# Patient Record
Sex: Male | Born: 1965 | Race: White | Hispanic: No | Smoking: Never smoker
Health system: Southern US, Community
[De-identification: ages and names within clinical notes are randomized; demographics above are authoritative.]

## PROBLEM LIST (undated history)

## (undated) DIAGNOSIS — I5020 Unspecified systolic (congestive) heart failure: Secondary | ICD-10-CM

## (undated) DIAGNOSIS — I513 Intracardiac thrombosis, not elsewhere classified: Secondary | ICD-10-CM

## (undated) DIAGNOSIS — I421 Obstructive hypertrophic cardiomyopathy: Secondary | ICD-10-CM

## (undated) DIAGNOSIS — I4819 Other persistent atrial fibrillation: Secondary | ICD-10-CM

## (undated) HISTORY — DX: Obstructive hypertrophic cardiomyopathy: I42.1

## (undated) HISTORY — DX: Intracardiac thrombosis, not elsewhere classified: I51.3

## (undated) HISTORY — DX: Other persistent atrial fibrillation: I48.19

## (undated) HISTORY — DX: Morbid (severe) obesity due to excess calories: E66.01

## (undated) HISTORY — DX: Unspecified systolic (congestive) heart failure: I50.20

## (undated) SURGERY — Surgical Case
Anesthesia: *Unknown

---

## 2018-08-06 ENCOUNTER — Emergency Department: Payer: 59

## 2018-08-06 ENCOUNTER — Other Ambulatory Visit: Payer: Self-pay

## 2018-08-06 ENCOUNTER — Inpatient Hospital Stay
Admission: EM | Admit: 2018-08-06 | Discharge: 2018-08-10 | DRG: 302 | Disposition: A | Discharge status: Home / Self Care | Payer: 59 | Attending: Internal Medicine | Admitting: Internal Medicine

## 2018-08-06 DIAGNOSIS — Z800 Family history of malignant neoplasm of digestive organs: Secondary | ICD-10-CM | POA: Diagnosis not present

## 2018-08-06 DIAGNOSIS — E876 Hypokalemia: Secondary | ICD-10-CM | POA: Diagnosis present

## 2018-08-06 DIAGNOSIS — D649 Anemia, unspecified: Secondary | ICD-10-CM | POA: Diagnosis present

## 2018-08-06 DIAGNOSIS — I513 Intracardiac thrombosis, not elsewhere classified: Secondary | ICD-10-CM | POA: Diagnosis present

## 2018-08-06 DIAGNOSIS — F4320 Adjustment disorder, unspecified: Secondary | ICD-10-CM | POA: Diagnosis not present

## 2018-08-06 DIAGNOSIS — I5023 Acute on chronic systolic (congestive) heart failure: Secondary | ICD-10-CM | POA: Diagnosis present

## 2018-08-06 DIAGNOSIS — I420 Dilated cardiomyopathy: Secondary | ICD-10-CM | POA: Diagnosis not present

## 2018-08-06 DIAGNOSIS — I340 Nonrheumatic mitral (valve) insufficiency: Secondary | ICD-10-CM | POA: Diagnosis not present

## 2018-08-06 DIAGNOSIS — Z6841 Body Mass Index (BMI) 40.0 and over, adult: Secondary | ICD-10-CM

## 2018-08-06 DIAGNOSIS — I472 Ventricular tachycardia, unspecified: Secondary | ICD-10-CM

## 2018-08-06 DIAGNOSIS — T447X5A Adverse effect of beta-adrenoreceptor antagonists, initial encounter: Secondary | ICD-10-CM | POA: Diagnosis present

## 2018-08-06 DIAGNOSIS — R140 Abdominal distension (gaseous): Secondary | ICD-10-CM | POA: Diagnosis present

## 2018-08-06 DIAGNOSIS — I482 Chronic atrial fibrillation, unspecified: Secondary | ICD-10-CM | POA: Diagnosis present

## 2018-08-06 DIAGNOSIS — Z1159 Encounter for screening for other viral diseases: Secondary | ICD-10-CM | POA: Diagnosis not present

## 2018-08-06 DIAGNOSIS — Z825 Family history of asthma and other chronic lower respiratory diseases: Secondary | ICD-10-CM

## 2018-08-06 DIAGNOSIS — I4891 Unspecified atrial fibrillation: Secondary | ICD-10-CM | POA: Diagnosis not present

## 2018-08-06 DIAGNOSIS — I5022 Chronic systolic (congestive) heart failure: Secondary | ICD-10-CM

## 2018-08-06 DIAGNOSIS — I5043 Acute on chronic combined systolic (congestive) and diastolic (congestive) heart failure: Secondary | ICD-10-CM | POA: Diagnosis not present

## 2018-08-06 DIAGNOSIS — F4323 Adjustment disorder with mixed anxiety and depressed mood: Secondary | ICD-10-CM | POA: Diagnosis not present

## 2018-08-06 DIAGNOSIS — Q6689 Other  specified congenital deformities of feet: Secondary | ICD-10-CM

## 2018-08-06 DIAGNOSIS — I361 Nonrheumatic tricuspid (valve) insufficiency: Secondary | ICD-10-CM | POA: Diagnosis not present

## 2018-08-06 DIAGNOSIS — I5021 Acute systolic (congestive) heart failure: Secondary | ICD-10-CM | POA: Diagnosis not present

## 2018-08-06 LAB — CBC WITH DIFFERENTIAL/PLATELET
Abs Immature Granulocytes: 0.02 10*3/uL (ref 0.00–0.07)
Basophils Absolute: 0.1 10*3/uL (ref 0.0–0.1)
Basophils Relative: 1 %
Eosinophils Absolute: 0 10*3/uL (ref 0.0–0.5)
Eosinophils Relative: 0 %
HCT: 45.9 % (ref 39.0–52.0)
Hemoglobin: 14.5 g/dL (ref 13.0–17.0)
Immature Granulocytes: 0 %
Lymphocytes Relative: 26 %
Lymphs Abs: 1.8 10*3/uL (ref 0.7–4.0)
MCH: 29.4 pg (ref 26.0–34.0)
MCHC: 31.6 g/dL (ref 30.0–36.0)
MCV: 93.1 fL (ref 80.0–100.0)
Monocytes Absolute: 0.5 10*3/uL (ref 0.1–1.0)
Monocytes Relative: 8 %
Neutro Abs: 4.6 10*3/uL (ref 1.7–7.7)
Neutrophils Relative %: 65 %
Platelets: 164 10*3/uL (ref 150–400)
RBC: 4.93 MIL/uL (ref 4.22–5.81)
RDW: 14.8 % (ref 11.5–15.5)
WBC: 7 10*3/uL (ref 4.0–10.5)
nRBC: 0 % (ref 0.0–0.2)

## 2018-08-06 LAB — BRAIN NATRIURETIC PEPTIDE: B Natriuretic Peptide: 480 pg/mL — ABNORMAL HIGH (ref 0.0–100.0)

## 2018-08-06 LAB — COMPREHENSIVE METABOLIC PANEL
ALT: 24 U/L (ref 0–44)
AST: 35 U/L (ref 15–41)
Albumin: 4 g/dL (ref 3.5–5.0)
Alkaline Phosphatase: 77 U/L (ref 38–126)
Anion gap: 9 (ref 5–15)
BUN: 11 mg/dL (ref 6–20)
CO2: 23 mmol/L (ref 22–32)
Calcium: 8.7 mg/dL — ABNORMAL LOW (ref 8.9–10.3)
Chloride: 105 mmol/L (ref 98–111)
Creatinine, Ser: 1.07 mg/dL (ref 0.61–1.24)
GFR calc Af Amer: >60 mL/min (ref >60)
GFR calc non Af Amer: >60 mL/min (ref >60)
Glucose, Bld: 138 mg/dL — ABNORMAL HIGH (ref 70–99)
Potassium: 4.1 mmol/L (ref 3.5–5.1)
Sodium: 137 mmol/L (ref 135–145)
Total Bilirubin: 2 mg/dL — ABNORMAL HIGH (ref 0.3–1.2)
Total Protein: 7.1 g/dL (ref 6.5–8.1)

## 2018-08-06 LAB — HEPARIN LEVEL (UNFRACTIONATED): Heparin Unfractionated: <0.1 IU/mL — ABNORMAL LOW (ref 0.30–0.70)

## 2018-08-06 LAB — PROTIME-INR
INR: 1.3 — ABNORMAL HIGH (ref 0.8–1.2)
Prothrombin Time: 15.6 seconds — ABNORMAL HIGH (ref 11.4–15.2)

## 2018-08-06 LAB — MRSA PCR SCREENING: MRSA by PCR: NEGATIVE

## 2018-08-06 LAB — TROPONIN I (HIGH SENSITIVITY)
Troponin I (High Sensitivity): 22 ng/L — ABNORMAL HIGH (ref <18)
Troponin I (High Sensitivity): 18 ng/L — ABNORMAL HIGH (ref <18)

## 2018-08-06 LAB — SARS CORONAVIRUS 2 BY RT PCR (HOSPITAL ORDER, PERFORMED IN ~~LOC~~ HOSPITAL LAB): SARS Coronavirus 2: NEGATIVE

## 2018-08-06 MED ORDER — DILTIAZEM HCL 25 MG/5ML IV SOLN
10.0000 mg | Freq: Once | INTRAVENOUS | Status: AC
  Administered 2018-08-06: 10 mg via INTRAVENOUS
  Filled 2018-08-06: qty 5

## 2018-08-06 MED ORDER — ONDANSETRON HCL 4 MG/2ML IJ SOLN
4.0000 mg | Freq: Four times a day (QID) | INTRAMUSCULAR | Status: DC | PRN
  Administered 2018-08-06 – 2018-08-07 (×2): 4 mg via INTRAVENOUS
  Filled 2018-08-06 (×2): qty 2

## 2018-08-06 MED ORDER — DILTIAZEM HCL 100 MG IV SOLR
5.0000 mg/h | INTRAVENOUS | Status: DC
  Administered 2018-08-06: 17:00:00 5 mg/h via INTRAVENOUS
  Administered 2018-08-06: 10 mg/h via INTRAVENOUS
  Administered 2018-08-07: 10:00:00 12.5 mg/h via INTRAVENOUS
  Filled 2018-08-06 (×3): qty 100

## 2018-08-06 MED ORDER — DILTIAZEM HCL 25 MG/5ML IV SOLN: 10.0000 mg | Freq: Once | INTRAVENOUS | Status: DC

## 2018-08-06 MED ORDER — ASPIRIN EC 81 MG PO TBEC
81.0000 mg | DELAYED_RELEASE_TABLET | Freq: Every day | ORAL | Status: DC
  Administered 2018-08-06 – 2018-08-10 (×4): 81 mg via ORAL
  Filled 2018-08-06 (×6): qty 1

## 2018-08-06 MED ORDER — AMIODARONE HCL IN DEXTROSE 360-4.14 MG/200ML-% IV SOLN
60.0000 mg/h | INTRAVENOUS | Status: DC
  Administered 2018-08-06: 17:00:00 60 mg/h via INTRAVENOUS
  Filled 2018-08-06: qty 200

## 2018-08-06 MED ORDER — ENOXAPARIN SODIUM 40 MG/0.4ML ~~LOC~~ SOLN: 40.0000 mg | Freq: Two times a day (BID) | SUBCUTANEOUS | Status: DC

## 2018-08-06 MED ORDER — ACETAMINOPHEN 325 MG PO TABS
650.0000 mg | ORAL_TABLET | ORAL | Status: DC | PRN
  Administered 2018-08-07: 15:00:00 650 mg via ORAL
  Filled 2018-08-06: qty 2

## 2018-08-06 MED ORDER — AMIODARONE HCL IN DEXTROSE 360-4.14 MG/200ML-% IV SOLN
30.0000 mg/h | INTRAVENOUS | Status: DC
  Administered 2018-08-07: 30 mg/h via INTRAVENOUS
  Filled 2018-08-06: qty 200

## 2018-08-06 MED ORDER — LISINOPRIL 5 MG PO TABS
5.0000 mg | ORAL_TABLET | Freq: Every day | ORAL | Status: DC
  Administered 2018-08-06: 5 mg via ORAL
  Filled 2018-08-06 (×2): qty 1

## 2018-08-06 MED ORDER — AMIODARONE IV BOLUS ONLY 150 MG/100ML
INTRAVENOUS | Status: AC
  Administered 2018-08-06: 150 mg
  Filled 2018-08-06: qty 100

## 2018-08-06 MED ORDER — TRAZODONE HCL 50 MG PO TABS
50.0000 mg | ORAL_TABLET | Freq: Every evening | ORAL | Status: DC | PRN
  Administered 2018-08-06 – 2018-08-09 (×4): 50 mg via ORAL
  Filled 2018-08-06 (×4): qty 1

## 2018-08-06 MED ORDER — SODIUM CHLORIDE 0.9 % IV SOLN
250.0000 mL | INTRAVENOUS | Status: DC | PRN
  Administered 2018-08-08: 09:00:00 via INTRAVENOUS

## 2018-08-06 MED ORDER — FUROSEMIDE 10 MG/ML IJ SOLN
60.0000 mg | Freq: Two times a day (BID) | INTRAMUSCULAR | Status: DC
  Administered 2018-08-06 – 2018-08-10 (×8): 60 mg via INTRAVENOUS
  Filled 2018-08-06 (×9): qty 6

## 2018-08-06 MED ORDER — ENOXAPARIN SODIUM 150 MG/ML ~~LOC~~ SOLN
1.0000 mg/kg | Freq: Two times a day (BID) | SUBCUTANEOUS | Status: DC
  Filled 2018-08-06 (×3): qty 0.88

## 2018-08-06 MED ORDER — CARVEDILOL 3.125 MG PO TABS
3.1250 mg | ORAL_TABLET | Freq: Two times a day (BID) | ORAL | Status: DC
  Administered 2018-08-06 – 2018-08-07 (×2): 3.125 mg via ORAL
  Filled 2018-08-06 (×2): qty 1

## 2018-08-06 MED ORDER — SODIUM CHLORIDE 0.9% FLUSH: 3.0000 mL | INTRAVENOUS | Status: DC | PRN

## 2018-08-06 MED ORDER — SODIUM CHLORIDE 0.9% FLUSH
3.0000 mL | Freq: Two times a day (BID) | INTRAVENOUS | Status: DC
  Administered 2018-08-06 – 2018-08-07 (×3): 3 mL via INTRAVENOUS
  Administered 2018-08-08: 11:00:00 via INTRAVENOUS
  Administered 2018-08-08 – 2018-08-09 (×3): 3 mL via INTRAVENOUS

## 2018-08-06 MED ORDER — POTASSIUM CHLORIDE CRYS ER 20 MEQ PO TBCR
20.0000 meq | EXTENDED_RELEASE_TABLET | Freq: Every day | ORAL | Status: DC
  Administered 2018-08-06 – 2018-08-07 (×2): 20 meq via ORAL
  Filled 2018-08-06 (×2): qty 1

## 2018-08-06 NOTE — ED Notes (Signed)
Trop drawn and sent

## 2018-08-06 NOTE — ED Notes (Signed)
Patient having runs of vt as well as runs of pvc's. Md at bedside to assess. 150 mg amiodarone ordered, med override obtained. As per md will run amiodarone bolus at very slow rate due to b/p, rate set at 33.3mg /hr.

## 2018-08-06 NOTE — ED Provider Notes (Signed)
[facility] Emergency Department Provider Note   First MD Initiated Contact with Patient 08/06/18 1309     (approximate)  I have reviewed the triage vital signs and the nursing notes.   HISTORY  Chief Complaint Atrial Fibrillation    HPI Kenneth Ferguson is a 53 y.o. male referred to the emergency department from urgent care secondary to rapid heartbeat.  Patient states that for the past 3 weeks he has noted worsening bilateral lower extremity edema.  Patient states he was seen at urgent care and diagnosed with bronchitis for which he was prescribed a Z-Pak.  However symptoms has persisted.  Patient also admits to a 10 pound weight gain in the past week.  Patient that his wife recently died and as a result he had an episode of atrial fibrillation following that event which she states that he has not had in many years.  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  Patient also admits to orthopnea.        Past Medical History:  Diagnosis Date  . A-fib [facility])     Patient Active Problem List   Diagnosis Date Noted  . A-fib ([place]) 08/06/2018    History reviewed. No pertinent surgical history.  Prior to Admission medications   Not on File    Allergies Patient has no known allergies.  No family history on file.  Social History Social History   Tobacco Use  . Smoking status: Never Smoker  . Smokeless tobacco: Never Used  Substance Use Topics  . Alcohol use: Never    Frequency: Never  . Drug use: Never    Review of Systems Constitutional: No fever/chills Eyes: No visual changes. ENT: No sore throat. Cardiovascular: Denies chest pain. Respiratory: Positive for dyspnea shortness of breath. Gastrointestinal: No abdominal pain.  No nausea, no vomiting.  No diarrhea.  No constipation. Genitourinary: Negative for dysuria. Musculoskeletal: Negative for neck pain.  Negative for back pain.  Integumentary: Negative for rash. Neurological: Negative for headaches, focal weakness or numbness.   ____________________________________________   PHYSICAL EXAM:  VITAL SIGNS: ED Triage Vitals  Enc Vitals Group     BP 08/06/18 1258 129/79     Pulse Rate 08/06/18 1258 93     Resp 08/06/18 1258 16     Temp 08/06/18 1258 98.7 F (37.1 C)     Temp Source 08/06/18 1258 Oral     SpO2 08/06/18 1258 95 %     Weight 08/06/18 1255 131.5 kg (290 lb)     Height 08/06/18 1255 1.727 m (5\' 8" )     Head Circumference --      Peak Flow --      Pain Score 08/06/18 1255 0     Pain Loc --      Pain Edu? --      Excl. in [place]? --     Constitutional: Alert and oriented.  Very tearful. Eyes: Conjunctivae are normal. Mouth/Throat: Mucous membranes are moist. Oropharynx non-erythematous. Neck: No stridor.  Cardiovascular: Normal rate, regular rhythm. Good peripheral circulation. Grossly normal heart sounds. Respiratory: Normal respiratory effort.  No retractions.  Bibasilar rales gastrointestinal: Soft and nontender. No distention.  Musculoskeletal: 2-3+ bilateral lower extremity pitting edema Neurologic:  Normal speech and language. No gross focal neurologic deficits are appreciated.  Skin:  Skin is warm, dry and intact. No rash noted. Psychiatric: Mood and affect are normal. Speech and behavior are normal.  ____________________________________________   LABS (all  labs ordered are listed, but only abnormal results are displayed)  Labs Reviewed  COMPREHENSIVE METABOLIC PANEL - Abnormal; Notable for the following components:      Result Value   Glucose, Bld 138 (*)    Calcium 8.7 (*)    Total Bilirubin 2.0 (*)    All other components within normal limits  TROPONIN I (HIGH SENSITIVITY) - Abnormal; Notable for the following components:   Troponin I (High Sensitivity) 22 (*)    All other components within normal limits  TROPONIN I (HIGH SENSITIVITY) - Abnormal; Notable for the following  components:   Troponin I (High Sensitivity) 18 (*)    All other components within normal limits  BRAIN NATRIURETIC PEPTIDE - Abnormal; Notable for the following components:   B Natriuretic Peptide 480.0 (*)    All other components within normal limits  HEPARIN LEVEL (UNFRACTIONATED) - Abnormal; Notable for the following components:   Heparin Unfractionated <0.10 (*)    All other components within normal limits  PROTIME-INR - Abnormal; Notable for the following components:   Prothrombin Time 15.6 (*)    INR 1.3 (*)    All other components within normal limits  SARS CORONAVIRUS 2 (HOSPITAL ORDER, PERFORMED IN  HOSPITAL LAB)  MRSA PCR SCREENING  CBC WITH DIFFERENTIAL/PLATELET  HIV ANTIBODY (ROUTINE TESTING W REFLEX)  BASIC METABOLIC PANEL  MAGNESIUM   ____________________________________________  EKG  ED ECG REPORT I, [place] N Clyde Zarrella, the attending physician, personally viewed and interpreted this ECG.   Date: 08/06/2018  EKG Time: 12:56 PM  Rate: 168  Rhythm: Atrial fibrillation with rapid ventricular response  Axis: Normal  Intervals: Irregular RR interval  ST&T Change: None  ____________________________________________  RADIOLOGY I, [place] N Etrulia Zarr, personally viewed and evaluated these images (plain radiographs) as part of my medical decision making, as well as reviewing the written report by the radiologist.  ED MD interpretation: Cardiomegaly with vascular congestion curly B lines and cephalization noted on chest x-ray  Official radiology report(s): Dg Chest 2 View  Result Date: 08/06/2018 CLINICAL DATA:  Atrial fibrillation.  Chest pain. EXAM: CHEST - 2 VIEW COMPARISON:  None. FINDINGS: There is cardiomegaly with mild pulmonary vascular congestion, a small right effusion and a tiny left effusion. No consolidative infiltrates.  Bones are normal. IMPRESSION: Congestive heart failure. Electronically Signed   By: Francene BoyersJames  Maxwell M.D.   On: 08/06/2018 14:02      .Critical Care Performed by: Darci CurrentBrown, Nora N, MD Authorized by: Darci CurrentBrown, [place] N, MD   Critical care provider statement:    Critical care time (minutes):  60   Critical care time was exclusive of:  Separately billable procedures and treating other patients (Atrial fibrillation with rapid ventricular response, nonsustained ventricular tachycardia)   Critical care was time spent personally by me on the following activities:  Development of treatment plan with patient or surrogate, discussions with consultants, evaluation of patient's response to treatment, examination of patient, obtaining history from patient or surrogate, ordering and performing treatments and interventions, ordering and review of laboratory studies, ordering and review of radiographic studies, pulse oximetry, re-evaluation of patient's condition and review of old charts     ____________________________________________   INITIAL IMPRESSION / MDM / ASSESSMENT AND PLAN / ED COURSE  As part of my medical decision making, I reviewed the following data within the electronic MEDICAL RECORD NUMBER   53 year old male presenting with above-stated history and physical exam consistent with atrial fibrillation with rapid ventricular response and concern for possible new onset  congestive heart failure.  Patient given IV diltiazem 10 mg without any sustained improvement in the patient's heart rate.  Reevaluation patient had a 5 beat run of nonsustained ventricular tachycardia.  Amiodarone infusion initiated at this point.  Patient discussed with Dr. Johny Drilling for hospital admission for further evaluation and management of atrial fibrillation with rapid ventricular response, nonsustained ventricular tachycardia and a new onset congestive heart failure  *Romere Gardy was evaluated in Emergency Department on 08/06/2018 for the symptoms described in the history of present illness. He was evaluated in the context of the global COVID-19 pandemic,  which necessitated consideration that the patient might be at risk for infection with the SARS-CoV-2 virus that causes COVID-19. Institutional protocols and algorithms that pertain to the evaluation of patients at risk for COVID-19 are in a state of rapid change based on information released by regulatory bodies including the CDC and federal and state organizations. These policies and algorithms were followed during the patient's care in the ED.  Some ED evaluations and interventions may be delayed as a result of limited staffing during the pandemic.*    ____________________________________________  FINAL CLINICAL IMPRESSION(S) / ED DIAGNOSES  Final diagnoses:  Atrial fibrillation with rapid ventricular response (HCC)  Ventricular tachycardia (HCC)  New onset congestive heart failure   MEDICATIONS GIVEN DURING THIS VISIT:  Medications  diltiazem (CARDIZEM) injection 10 mg (10 mg Intravenous Not Given 08/06/18 1444)  amiodarone (NEXTERONE PREMIX) 360-4.14 MG/200ML-% (1.8 mg/mL) IV infusion (60 mg/hr Intravenous Rate/Dose Verify 08/06/18 2000)    Followed by  amiodarone (NEXTERONE PREMIX) 360-4.14 MG/200ML-% (1.8 mg/mL) IV infusion (30 mg/hr Intravenous Rate/Dose Verify 08/06/18 2100)  sodium chloride flush (NS) 0.9 % injection 3 mL (has no administration in time range)  sodium chloride flush (NS) 0.9 % injection 3 mL (has no administration in time range)  0.9 %  sodium chloride infusion (has no administration in time range)  acetaminophen (TYLENOL) tablet 650 mg (has no administration in time range)  ondansetron (ZOFRAN) injection 4 mg (has no administration in time range)  furosemide (LASIX) injection 60 mg (60 mg Intravenous Given 08/06/18 1803)  potassium chloride SA (K-DUR) CR tablet 20 mEq (20 mEq Oral Given 08/06/18 1803)  lisinopril (ZESTRIL) tablet 5 mg (5 mg Oral Given 08/06/18 1804)  carvedilol (COREG) tablet 3.125 mg (3.125 mg Oral Given 08/06/18 1803)  aspirin EC tablet 81 mg (81 mg  Oral Given 08/06/18 1803)  diltiazem (CARDIZEM) 100 mg in dextrose 5 % 100 mL (1 mg/mL) infusion (15 mg/hr Intravenous Rate/Dose Verify 08/06/18 2035)  enoxaparin (LOVENOX) injection 130 mg (has no administration in time range)  diltiazem (CARDIZEM) injection 10 mg (10 mg Intravenous Given 08/06/18 1337)  amiodarone (NEXTERONE) 150-4.21 MG/100ML-% bolus (  Stopped 08/06/18 1555)     ED Discharge Orders    None       Note:  This document was prepared using Dragon voice recognition software and may include unintentional dictation errors.   Darci Current, MD 08/06/18 2108

## 2018-08-06 NOTE — ED Triage Notes (Signed)
Pt reports he went to urgent care and they sent here for afib RVR - pt has hx of same - 3 weeks ago sick with "bronchitis" and given zpac - noted edema to lower ext x3 weeks (gained 10lbs in one week) - c/o chest pain - denies [facility]

## 2018-08-06 NOTE — Consult Note (Signed)
  Amiodarone Drug - Drug Interaction Consult Note  Recommendations: There are no interactions with the medications that patient is currently  on. Amiodarone is metabolized by the cytochrome P450 system and therefore has the potential to cause many drug interactions. Amiodarone has an average plasma half-life of 50 days (range 20 to 100 days).   There is potential for drug interactions to occur several weeks or months after stopping treatment and the onset of drug interactions may be slow after initiating amiodarone.   []  Statins: Increased risk of myopathy. Simvastatin- restrict dose to 20mg  daily. Other statins: counsel patients to report any muscle pain or weakness immediately.  []  Anticoagulants: Amiodarone can increase anticoagulant effect. Consider warfarin dose reduction. Patients should be monitored closely and the dose of anticoagulant altered accordingly, remembering that amiodarone levels take several weeks to stabilize.  []  Antiepileptics: Amiodarone can increase plasma concentration of phenytoin, the dose should be reduced. Note that small changes in phenytoin dose can result in large changes in levels. Monitor patient and counsel on signs of toxicity.  []  Beta blockers: increased risk of bradycardia, AV block and myocardial depression. Sotalol - avoid concomitant use.  []   Calcium channel blockers (diltiazem and verapamil): increased risk of bradycardia, AV block and myocardial depression.  []   Cyclosporine: Amiodarone increases levels of cyclosporine. Reduced dose of cyclosporine is recommended.  []  Digoxin dose should be halved when amiodarone is started.  []  Diuretics: increased risk of cardiotoxicity if hypokalemia occurs.  []  Oral hypoglycemic agents (glyburide, glipizide, glimepiride): increased risk of hypoglycemia. Patient's glucose levels should be monitored closely when initiating amiodarone therapy.   []  Drugs that prolong the QT interval:  Torsades de pointes risk  may be increased with concurrent use - avoid if possible.  Monitor QTc, also keep magnesium/potassium WNL if concurrent therapy can't be avoided. Marland Kitchen Antibiotics: e.g. fluoroquinolones, erythromycin. . Antiarrhythmics: e.g. quinidine, procainamide, disopyramide, sotalol. . Antipsychotics: e.g. phenothiazines, haloperidol.  . Lithium, tricyclic antidepressants, and methadone. Thank You,  Pearla Dubonnet  08/06/2018 2:36 PM

## 2018-08-06 NOTE — Consult Note (Signed)
ANTICOAGULATION CONSULT NOTE - Initial Consult  Pharmacy Consult for Enoxaparin Dosing Indication: atrial fibrillation  No Known Allergies  Patient Measurements: Height: 5\' 8"  (172.7 cm) Weight: 290 lb (131.5 kg) IBW/kg (Calculated) : 68.4 Heparin Dosing Weight: 99.3 kg  Vital Signs: Temp: 98.7 F (37.1 C) (06/29 1258) Temp Source: Oral (06/29 1258) BP: 138/110 (06/29 1515) Pulse Rate: 102 (06/29 1515)  Labs: Recent Labs    08/06/18 1316  HGB 14.5  HCT 45.9  PLT 164  CREATININE 1.07  TROPONINIHS 18*  22*    Estimated Creatinine Clearance: 104.5 mL/min (by C-G formula based on SCr of 1.07 mg/dL).   Medical History: Past Medical History:  Diagnosis Date  . A-fib ([place])     Medications:  (Not in a hospital admission)  Scheduled:  . diltiazem  10 mg Intravenous Once  . enoxaparin (LOVENOX) injection  1 mg/kg Subcutaneous Q12H   Infusions:  . amiodarone 150 mg/hr (08/06/18 1526)   Followed by  . amiodarone    . diltiazem (CARDIZEM) infusion     PRN:   Assessment: Pharmacy has been consulted to initiate Lovenox therapy for Afib on 53 y.o. patient. Patient has no prior history of DOAC or other anticoagulant use. Will start therapy immediately. Will obtain baseline labs.  Goal of Therapy:   Monitor platelets by anticoagulation protocol: Yes   Plan:  Lovenox 130mg  IV Q12 hours has been ordered based on 1mg /kg BID dosing for Afib.  Pharmacy will continue to monitor with AM labs and CBC at least once every 3 days per protocol.  Pearla Dubonnet, PharmD Clinical Pharmacist 08/06/2018 4:27 PM

## 2018-08-06 NOTE — ED Notes (Signed)
ED TO INPATIENT HANDOFF REPORT  ED Nurse Name and Phone #:    S Name/Age/Gender Kenneth Ferguson 53 y.o. male Room/Bed: ED17A/ED17A  Code Status   Code Status: Not on file  Home/SNF/Other Home Patient oriented to: self, place, time and situation Is this baseline? Yes   Triage Complete: Triage complete  Chief Complaint A fib/rvr per urgent care   Triage Note Pt reports he went to urgent care and they sent here for afib RVR - pt has hx of same - 3 weeks ago sick with "bronchitis" and given zpac - noted edema to lower ext x3 weeks (gained 10lbs in one week) - c/o chest pain - denies [facility]    Allergies No Known Allergies  Level of Care/Admitting Diagnosis ED Disposition    ED Disposition Condition Comment   Admit  Hospital Area: [facility] [[id number]]  Level of Care: Telemetry [5]  Covid Evaluation: Person Under Investigation (PUI)  Isolation Risk Level: Low Risk/Droplet (Less than 4L [place] supplementation)  Diagnosis: A-fib [facility]) [[id number]]  Admitting Physician: Ihor Austin [[id number]]  Attending Physician: Ihor Austin [[id number]]  Estimated length of stay: past midnight tomorrow  Certification:: I certify this patient will need inpatient services for at least 2 midnights  PT Class (Do Not Modify): Inpatient [101]  PT Acc Code (Do Not Modify): Private [1]       B Medical/Surgery History Past Medical History:  Diagnosis Date  . A-fib [facility])    History reviewed. No pertinent surgical history.   A IV Location/Drains/Wounds Patient Lines/Drains/Airways Status   Active Line/Drains/Airways    Name:   Placement date:   Placement time:   Site:   Days:   Peripheral IV 08/06/18 Right Wrist   08/06/18    1333    Wrist   less than 1          Intake/Output Last 24 hours No intake or output data in the 24 hours ending 08/06/18 1527  Labs/Imaging Results for orders placed or performed during the hospital encounter of 08/06/18 (from the past 48 hour(s))   Comprehensive metabolic panel     Status: Abnormal   Collection Time: 08/06/18  1:16 PM  Result Value Ref Range   Sodium 137 135 - 145 mmol/L   Potassium 4.1 3.5 - 5.1 mmol/L    Comment: HEMOLYSIS AT THIS LEVEL MAY AFFECT RESULT   Chloride 105 98 - 111 mmol/L   CO2 23 22 - 32 mmol/L   Glucose, Bld 138 (H) 70 - 99 mg/dL   BUN 11 6 - 20 mg/dL   Creatinine, Ser 4.36 0.61 - 1.24 mg/dL   Calcium 8.7 (L) 8.9 - 10.3 mg/dL   Total Protein 7.1 6.5 - 8.1 g/dL   Albumin 4.0 3.5 - 5.0 g/dL   AST 35 15 - 41 U/L   ALT 24 0 - 44 U/L   Alkaline Phosphatase 77 38 - 126 U/L   Total Bilirubin 2.0 (H) 0.3 - 1.2 mg/dL   GFR calc non Af Amer >60 >60 mL/min   GFR calc Af Amer >60 >60 mL/min   Anion gap 9 5 - 15    Comment: Performed at [facility], [address] [place], [place] [address]  CBC with Differential     Status: None   Collection Time: 08/06/18  1:16 PM  Result Value Ref Range   WBC 7.0 4.0 - 10.5 K/uL   RBC 4.93 4.22 - 5.81 MIL/uL   Hemoglobin 14.5 13.0 - 17.0 g/dL  HCT 45.9 39.0 - 52.0 %   MCV 93.1 80.0 - 100.0 fL   MCH 29.4 26.0 - 34.0 pg   MCHC 31.6 30.0 - 36.0 g/dL   RDW 14.8 11.5 - 15.5 %   Platelets 164 150 - 400 K/uL   nRBC 0.0 0.0 - 0.2 %   Neutrophils Relative % 65 %   Neutro Abs 4.6 1.7 - 7.7 K/uL   Lymphocytes Relative 26 %   Lymphs Abs 1.8 0.7 - 4.0 K/uL   Monocytes Relative 8 %   Monocytes Absolute 0.5 0.1 - 1.0 K/uL   Eosinophils Relative 0 %   Eosinophils Absolute 0.0 0.0 - 0.5 K/uL   Basophils Relative 1 %   Basophils Absolute 0.1 0.0 - 0.1 K/uL   Immature Granulocytes 0 %   Abs Immature Granulocytes 0.02 0.00 - 0.07 K/uL    Comment: Performed at [facility], [place], [place] [address]  Troponin I (High Sensitivity)     Status: Abnormal   Collection Time: 08/06/18  1:16 PM  Result Value Ref Range   Troponin I (High Sensitivity) 22 (H) <18 ng/L    Comment: (NOTE) Elevated high sensitivity troponin I (hsTnI) values and  significant  changes across serial measurements may suggest ACS but many other  chronic and acute conditions are known to elevate hsTnI results.  Refer to the "Links" section for chest pain algorithms and additional  guidance. Performed at [facility], [address] [place], [place] [address]   Brain natriuretic peptide     Status: Abnormal   Collection Time: 08/06/18  1:16 PM  Result Value Ref Range   B Natriuretic Peptide 480.0 (H) 0.0 - 100.0 pg/mL    Comment: Performed at [facility], [address], [place], [place] [address]   Dg Chest 2 View  Result Date: 08/06/2018 CLINICAL DATA:  Atrial fibrillation.  Chest pain. EXAM: CHEST - 2 VIEW COMPARISON:  None. FINDINGS: There is cardiomegaly with mild pulmonary vascular congestion, a small right effusion and a tiny left effusion. No consolidative infiltrates.  Bones are normal. IMPRESSION: Congestive heart failure. Electronically Signed   By: Lorriane Shire M.D.   On: 08/06/2018 14:02    Pending Labs Unresulted Labs (From admission, onward)    Start     Ordered   08/06/18 1340  SARS Coronavirus 2 (CEPHEID - Performed in [place] hospital lab), [place]  (Asymptomatic Patients Labs)  Once,   STAT    Question:  Rule Out  Answer:  Yes   08/06/18 1339   08/06/18 1310  Troponin I (High Sensitivity)  STAT Now then every 2 hours,   STAT    Question:  Indication  Answer:  Other   08/06/18 1309   Signed and Held  HIV antibody (Routine Testing)  Once,   R     Signed and Held   Signed and Held  Basic metabolic panel  Daily,   R     Signed and Held          Vitals/Pain Today's Vitals   08/06/18 1445 08/06/18 1500 08/06/18 1515 08/06/18 1524  BP: 121/88  (!) 138/110   Pulse: 97 84 (!) 102   Resp: (!) 21 (!) 22 (!) 26   Temp:      TempSrc:      SpO2: 95% 95% 96%   Weight:      Height:      PainSc:    0-No pain  Isolation Precautions No active isolations  Medications Medications  diltiazem  (CARDIZEM) injection 10 mg (10 mg Intravenous Not Given 08/06/18 1444)  amiodarone (NEXTERONE PREMIX) 360-4.14 MG/200ML-% (1.8 mg/mL) IV infusion (150 mg/hr Intravenous Rate/Dose Change 08/06/18 1526)    Followed by  amiodarone (NEXTERONE PREMIX) 360-4.14 MG/200ML-% (1.8 mg/mL) IV infusion (has no administration in time range)  diltiazem (CARDIZEM) 100 mg in dextrose 5 % 100 mL (1 mg/mL) infusion (has no administration in time range)  diltiazem (CARDIZEM) injection 10 mg (10 mg Intravenous Given 08/06/18 1337)  amiodarone (NEXTERONE) 150-4.21 MG/100ML-% bolus (150 mg  New Bag/Given 08/06/18 1432)    Mobility walks with device Low fall risk   Focused Assessments Cardiac Assessment Handoff:    No results found for: CKTOTAL, CKMB, CKMBINDEX, TROPONINI No results found for: DDIMER Does the Patient currently have chest pain? No      R Recommendations: See Admitting Provider Note  Report given to:   Additional Notes:

## 2018-08-06 NOTE — Consult Note (Addendum)
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 fib with RVR previously on metoprolol.  Patient states that he stopped taking metoprolol 5 years ago due to bradycardia and hypotension.  Most recently he had significant emotional distress due to loss of his wife after multiple hospitalizations.  Patient states is affected him severely and was crying during interview at bedside.  His son is with him during my evaluation.  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  He has had episodes where heart rate was over 180.  Recently in the past 3 weeks he has developed bronchitis and has been treated with a Z-Pak and albuterol inhaler.  He had noticed 3+ pitting edema bilaterally and feeling congested with resultant 12 pound weight gain in the past 3 weeks.  He notes that over last several months he has been profoundly dyspneic while ambulating during work.  He denies constitutional symptoms, flulike illness or sick contacts.  In the ED he was noted to have atrial fibrillation with rapid ventricular response and nonsustained ventricular tachycardia.  Chest x-ray shows cephalization with curly B-lines consistent with interstitial edema.  Patient was upgraded to medical intensive care unit for Cardizem drip and ICU monitoring as well as evaluation for new onset CHF.  PAST MEDICAL HISTORY   Past Medical History:  Diagnosis Date  . A-fib [facility])      SURGICAL HISTORY   History reviewed. No pertinent surgical history.    FAMILY HISTORY   No family history on file.   SOCIAL HISTORY   Social History   Tobacco Use  . Smoking status: Never Smoker  . Smokeless tobacco: Never Used  Substance Use Topics  . Alcohol use: Never    Frequency: Never  . Drug use: Never     MEDICATIONS   Current Medication:  Current Facility-Administered Medications:  .  0.9 %  sodium chloride infusion, 250 mL, Intravenous, PRN, Pyreddy, Pavan, MD .  acetaminophen (TYLENOL) tablet 650 mg, 650 mg, Oral, Q4H PRN, Pyreddy, Pavan, MD .  amiodarone (NEXTERONE PREMIX) 360-4.14 MG/200ML-% (1.8 mg/mL) IV infusion, 60 mg/hr, Intravenous, Continuous, Last Rate: 33.3 mL/hr at 08/06/18 1712, 60 mg/hr at 08/06/18 1712 **FOLLOWED BY** amiodarone (NEXTERONE PREMIX) 360-4.14 MG/200ML-% (1.8 mg/mL) IV infusion, 30 mg/hr, Intravenous, Continuous, Gregor Hams, MD .  aspirin EC tablet 81 mg, 81 mg, Oral, Daily, Pyreddy, Pavan, MD, 81 mg at 08/06/18 1803 .  carvedilol (COREG) tablet 3.125 mg, 3.125 mg, Oral, BID WC, Pyreddy, Pavan, MD, 3.125 mg at 08/06/18 1803 .  diltiazem (CARDIZEM) 100 mg in dextrose 5 % 100 mL (1 mg/mL) infusion, 5-15 mg/hr, Intravenous, Titrated, Pyreddy, Pavan, MD, Last Rate: 10 mL/hr at 08/06/18 1741, 10 mg/hr at 08/06/18 1741 .  diltiazem (CARDIZEM) injection 10 mg, 10 mg, Intravenous, Once, Gregor Hams, MD .  enoxaparin (LOVENOX) injection 130 mg, 1 mg/kg, Subcutaneous, Q12H, Nazari, Walid A, RPH .  furosemide (LASIX) injection 60 mg, 60 mg, Intravenous, BID, Pyreddy, Pavan, MD, 60 mg at 08/06/18 1803 .  lisinopril (ZESTRIL) tablet 5 mg, 5 mg, Oral, Daily, Pyreddy, Pavan, MD, 5 mg at 08/06/18 1804 .  ondansetron (ZOFRAN) injection 4 mg, 4 mg, Intravenous, Q6H  PRN, Ihor AustinPyreddy, Pavan, MD .  potassium chloride SA (K-DUR) CR tablet 20 mEq, 20 mEq, Oral, Daily, Pyreddy, Pavan, MD, 20 mEq at 08/06/18 1803 .  sodium chloride flush (NS) 0.9 % injection 3 mL, 3 mL, Intravenous, Q12H, Pyreddy, Pavan, MD .  sodium chloride  flush (NS) 0.9 % injection 3 mL, 3 mL, Intravenous, PRN, Ihor AustinPyreddy, Pavan, MD    ALLERGIES   Patient has no known allergies.    REVIEW OF SYSTEMS     10 point review of systems is negative except for fullness in the abdomen  PHYSICAL EXAMINATION   Vitals:   08/06/18 1729 08/06/18 1803  BP: (!) 144/106 126/71  Pulse: (!) 162 (!) 170  Resp:    Temp: 97.6 F (36.4 C)   SpO2: 98%     GENERAL: Emotional crying age-appropriate morbid obese male HEAD: Normocephalic, atraumatic.  EYES: Pupils equal, round, reactive to light.  No scleral icterus.  MOUTH: Moist mucosal membrane. NECK: Supple. No thyromegaly. No nodules. No JVD.  PULMONARY: Mild crackles at the bases bilaterally CARDIOVASCULAR: S1 and S2. Regular rate and rhythm. No murmurs, rubs, or gallops.  GASTROINTESTINAL: Soft, nontender, non-distended. No masses. Positive bowel sounds. No hepatosplenomegaly.  MUSCULOSKELETAL: No swelling, clubbing, or edema.  NEUROLOGIC: Mild distress due to acute illness SKIN:intact,warm,dry   LABS AND IMAGING   LAB RESULTS: Recent Labs  Lab 08/06/18 1316  NA 137  K 4.1  CL 105  CO2 23  BUN 11  CREATININE 1.07  GLUCOSE 138*   Recent Labs  Lab 08/06/18 1316  HGB 14.5  HCT 45.9  WBC 7.0  PLT 164     IMAGING RESULTS: Dg Chest 2 View  Result Date: 08/06/2018 CLINICAL DATA:  Atrial fibrillation.  Chest pain. EXAM: CHEST - 2 VIEW COMPARISON:  None. FINDINGS: There is cardiomegaly with mild pulmonary vascular congestion, a small right effusion and a tiny left effusion. No consolidative infiltrates.  Bones are normal. IMPRESSION: Congestive heart failure. Electronically Signed   By: Francene BoyersJames  Maxwell M.D.   On: 08/06/2018 14:02      ASSESSMENT AND PLAN    -Multidisciplinary rounds held today     Atrial fibrillation with rapid ventricular response- -currently on Cardizem gtt. as well as amiodarone -Cardiology consultation placed-appreciate input -oxygen as needed,  lovenox for theraputic AC -Lasix 60 mg IV administered -follow up cardiac enzymes as indicated ICU monitoring   Probable new onset CHF -Clinically patient with JVD and 3+ pitting lower extremity edema -BNP significantly elevated -Status post transthoracic echo report pending -Possible takatsubo due to working overtime and loss of wife after repeated hospitalizations and surgeries  -continue Foley Catheter-assess need daily   GI/Nutrition GI PROPHYLAXIS as indicated DIET-->TF's as tolerated Constipation protocol as indicated  ENDO - ICU hypoglycemic\Hyperglycemia protocol -check FSBS per protocol   ELECTROLYTES -follow labs as needed -replace as needed -pharmacy consultation   DVT/GI PRX ordered -SCDs  TRANSFUSIONS AS NEEDED MONITOR FSBS ASSESS the need for LABS as needed   Critical care provider statement:    Critical care time (minutes):  32   Critical care time was exclusive of:  Separately billable procedures and treating other patients   Critical care was necessary to treat or prevent imminent or life-threatening deterioration of the following conditions:   Atrial fibrillation, morbid obesity, probable new onset CHF, multiple comorbid conditions   Critical care was time spent personally by me on the following activities:  Development of treatment plan with patient or surrogate, discussions with consultants, evaluation of  patient's response to treatment, examination of patient, obtaining history from patient or surrogate, ordering and performing treatments and interventions, ordering and review of laboratory studies and re-evaluation of patient's condition.  I assumed direction of critical care for this patient from another provider in my specialty: no    This document was prepared using Dragon voice recognition software and may include unintentional dictation errors.    Vida Rigger, M.D.  Division of Pulmonary & Critical Care Medicine  [facility]

## 2018-08-06 NOTE — H&P (Addendum)
[facility] [place] NAME: Kenneth Ferguson    MR#:  [id number]  DATE OF BIRTH:  November 13, 1965  DATE OF ADMISSION:  08/06/2018  PRIMARY CARE PHYSICIAN: Patient, [name]   REQUESTING/REFERRING PHYSICIAN:   CHIEF COMPLAINT:   Chief Complaint  Patient presents with  . Atrial Fibrillation    HISTORY OF PRESENT ILLNESS: Kenneth Ferguson  is a 53 y.o. 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  He felt some palpitations.  Patient also noticed some swelling in the lower extremities and has been retaining fluid.  No history of any heart failure.  Patient was evaluated emergency room he was found to be in A. fib with rapid rate.  He also had runs of V. Tach.  She was started on IV amiodarone drip.  Chest x-ray revealed vascular congestion and heart failure changes.  No complaints of any chest pain.  Hospitalist service was consulted for further care.  PAST MEDICAL HISTORY:   Past Medical History:  Diagnosis Date  . A-fib ([place])     PAST SURGICAL HISTORY: cholecystectomy Orthopedic surgeries  SOCIAL HISTORY:  Social History   Tobacco Use  . Smoking status: Never Smoker  . Smokeless tobacco: Never Used  Substance Use Topics  . Alcohol use: Never    Frequency: Never    FAMILY HISTORY:  Mother deceased , had copd Father deceased, had liver cancer  DRUG ALLERGIES: No Known Allergies  REVIEW OF SYSTEMS:   CONSTITUTIONAL: No fever, fatigue or weakness.  EYES: No blurred or double vision.  EARS, NOSE, AND THROAT: No tinnitus or ear pain.  RESPIRATORY: No cough, has no shortness of breath,  No wheezing or hemoptysis.  CARDIOVASCULAR: No chest pain,has orthopnea, edema.  GASTROINTESTINAL: No nausea, vomiting, diarrhea or abdominal pain.  GENITOURINARY: No dysuria, hematuria.  ENDOCRINE: No polyuria, nocturia,  HEMATOLOGY: No anemia, easy bruising or  bleeding SKIN: No rash or lesion. MUSCULOSKELETAL: No joint pain or arthritis.   NEUROLOGIC: No tingling, numbness, weakness.  PSYCHIATRY: No anxiety or depression.   MEDICATIONS AT HOME:  Prior to Admission medications   Not on File      PHYSICAL EXAMINATION:   VITAL SIGNS: Blood pressure (!) 138/110, pulse (!) 102, temperature 98.7 F (37.1 C), temperature source Oral, resp. rate (!) 26, height 5\' 8"  (1.727 m), weight 131.5 kg, SpO2 96 %.  GENERAL:  53 y.o.-year-old patient lying in the bed with no acute distress.  EYES: Pupils equal, round, reactive to light and accommodation. No scleral icterus. Extraocular muscles intact.  HEENT: Head atraumatic, normocephalic. Oropharynx and nasopharynx clear.  NECK:  Supple, no jugular venous distention. No thyroid enlargement, no tenderness.  LUNGS: Normal breath sounds bilaterally, no wheezing, rales,rhonchi or crepitation. No use of accessory muscles of respiration.  CARDIOVASCULAR: S1, S2 irregular. No murmurs, rubs, or gallops.  ABDOMEN: Soft, nontender, nondistended. Bowel sounds present. No organomegaly or mass.  EXTREMITIES: Has pedal edema,  No cyanosis, or clubbing.  NEUROLOGIC: Cranial nerves II through XII are intact. Muscle strength 5/5 in all extremities. Sensation intact. Gait not checked.  PSYCHIATRIC: The patient is alert and oriented x 3.  SKIN: No obvious rash, lesion, or ulcer.   LABORATORY PANEL:   CBC Recent Labs  Lab 08/06/18 1316  WBC 7.0  HGB 14.5  HCT 45.9  PLT 164  MCV 93.1  MCH 29.4  MCHC 31.6  RDW  14.8  LYMPHSABS 1.8  MONOABS 0.5  EOSABS 0.0  BASOSABS 0.1   ------------------------------------------------------------------------------------------------------------------  Chemistries  Recent Labs  Lab 08/06/18 1316  NA 137  K 4.1  CL 105  CO2 23  GLUCOSE 138*  BUN 11  CREATININE 1.07  CALCIUM 8.7*  AST 35  ALT 24  ALKPHOS 77  BILITOT 2.0*    ------------------------------------------------------------------------------------------------------------------ estimated creatinine clearance is 104.5 mL/min (by C-G formula based on SCr of 1.07 mg/dL). ------------------------------------------------------------------------------------------------------------------ No results for input(s): TSH, T4TOTAL, T3FREE, THYROIDAB in the last 72 hours.  Invalid input(s): FREET3   Coagulation profile No results for input(s): INR, PROTIME in the last 168 hours. ------------------------------------------------------------------------------------------------------------------- No results for input(s): DDIMER in the last 72 hours. -------------------------------------------------------------------------------------------------------------------  Cardiac Enzymes No results for input(s): CKMB, TROPONINI, MYOGLOBIN in the last 168 hours.  Invalid input(s): CK ------------------------------------------------------------------------------------------------------------------ Invalid input(s): POCBNP  ---------------------------------------------------------------------------------------------------------------  Urinalysis No results found for: COLORURINE, APPEARANCEUR, LABSPEC, PHURINE, GLUCOSEU, HGBUR, BILIRUBINUR, KETONESUR, PROTEINUR, UROBILINOGEN, NITRITE, LEUKOCYTESUR   RADIOLOGY: Dg Chest 2 View  Result Date: 08/06/2018 CLINICAL DATA:  Atrial fibrillation.  Chest pain. EXAM: CHEST - 2 VIEW COMPARISON:  None. FINDINGS: There is cardiomegaly with mild pulmonary vascular congestion, a small right effusion and a tiny left effusion. No consolidative infiltrates.  Bones are normal. IMPRESSION: Congestive heart failure. Electronically Signed   By: Francene Boyers M.D.   On: 08/06/2018 14:02    EKG: Orders placed or performed during the hospital encounter of 08/06/18  . EKG 12-Lead  . EKG 12-Lead  . ED EKG  . 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  He felt some palpitations.  -Atrial fibrillation with rapid rate Admit patient to stepdown unit IV Cardizem drip for rate control Cardiology consult Check echocardiogram Anticoagulation with Lovenox full dose  -Ventricular tachycardia IV amiodarone drip Further recommendations by cardiology  -New onset congestive heart failure Diurese patient with IV Lasix Start patient on ACE inhibitor Potassium supplementation Daily body weight input output chart  -DVT prophylaxis On anticoagulation with Lovenox  -COVID-19 test pending results  All the records are reviewed and case discussed with ED provider. Management plans discussed with the patient, family and they are in agreement.  CODE STATUS: Full code  TOTAL CRITICAL CARE TIME TAKING CARE OF THIS PATIENT: 54 minutes.    Ihor Austin M.D on 08/06/2018 at 4:11 PM  Between 7am to 6pm - Pager - [phone]  After 6pm go to [email or web address] - password [email or web address]  [facility]  Office  [phone]  CC: Primary care physician; Patient, [name]

## 2018-08-07 ENCOUNTER — Telehealth: Payer: Self-pay | Admitting: Physician Assistant

## 2018-08-07 ENCOUNTER — Inpatient Hospital Stay (HOSPITAL_COMMUNITY)
Admit: 2018-08-07 | Discharge: 2018-08-07 | Disposition: A | Discharge status: Home / Self Care | Payer: 59 | Attending: Internal Medicine | Admitting: Internal Medicine

## 2018-08-07 DIAGNOSIS — I5022 Chronic systolic (congestive) heart failure: Secondary | ICD-10-CM

## 2018-08-07 DIAGNOSIS — I4891 Unspecified atrial fibrillation: Secondary | ICD-10-CM

## 2018-08-07 DIAGNOSIS — I5043 Acute on chronic combined systolic (congestive) and diastolic (congestive) heart failure: Secondary | ICD-10-CM

## 2018-08-07 DIAGNOSIS — I5021 Acute systolic (congestive) heart failure: Secondary | ICD-10-CM

## 2018-08-07 DIAGNOSIS — I5023 Acute on chronic systolic (congestive) heart failure: Secondary | ICD-10-CM

## 2018-08-07 LAB — CBC WITH DIFFERENTIAL/PLATELET
Abs Immature Granulocytes: 0.02 10*3/uL (ref 0.00–0.07)
Basophils Absolute: 0 10*3/uL (ref 0.0–0.1)
Basophils Relative: 1 %
Eosinophils Absolute: 0 10*3/uL (ref 0.0–0.5)
Eosinophils Relative: 1 %
HCT: 41.8 % (ref 39.0–52.0)
Hemoglobin: 12.8 g/dL — ABNORMAL LOW (ref 13.0–17.0)
Immature Granulocytes: 0 %
Lymphocytes Relative: 27 %
Lymphs Abs: 2.1 10*3/uL (ref 0.7–4.0)
MCH: 29.4 pg (ref 26.0–34.0)
MCHC: 30.6 g/dL (ref 30.0–36.0)
MCV: 96.1 fL (ref 80.0–100.0)
Monocytes Absolute: 0.8 10*3/uL (ref 0.1–1.0)
Monocytes Relative: 11 %
Neutro Abs: 4.6 10*3/uL (ref 1.7–7.7)
Neutrophils Relative %: 60 %
Platelets: 192 10*3/uL (ref 150–400)
RBC: 4.35 MIL/uL (ref 4.22–5.81)
RDW: 15 % (ref 11.5–15.5)
WBC: 7.6 10*3/uL (ref 4.0–10.5)
nRBC: 0 % (ref 0.0–0.2)

## 2018-08-07 LAB — HEMOGLOBIN A1C
Hgb A1c MFr Bld: 4.8 % (ref 4.8–5.6)
Mean Plasma Glucose: 91.06 mg/dL

## 2018-08-07 LAB — ECHOCARDIOGRAM COMPLETE
Height: 68 in
Weight: 4610.26 oz

## 2018-08-07 LAB — TSH: TSH: 2.126 u[IU]/mL (ref 0.350–4.500)

## 2018-08-07 LAB — BASIC METABOLIC PANEL
Anion gap: 8 (ref 5–15)
BUN: 13 mg/dL (ref 6–20)
CO2: 27 mmol/L (ref 22–32)
Calcium: 8.4 mg/dL — ABNORMAL LOW (ref 8.9–10.3)
Chloride: 103 mmol/L (ref 98–111)
Creatinine, Ser: 1.25 mg/dL — ABNORMAL HIGH (ref 0.61–1.24)
GFR calc Af Amer: >60 mL/min (ref >60)
GFR calc non Af Amer: >60 mL/min (ref >60)
Glucose, Bld: 116 mg/dL — ABNORMAL HIGH (ref 70–99)
Potassium: 3.8 mmol/L (ref 3.5–5.1)
Sodium: 138 mmol/L (ref 135–145)

## 2018-08-07 LAB — HEPARIN LEVEL (UNFRACTIONATED): Heparin Unfractionated: 0.53 IU/mL (ref 0.30–0.70)

## 2018-08-07 LAB — GLUCOSE, CAPILLARY: Glucose-Capillary: 111 mg/dL — ABNORMAL HIGH (ref 70–99)

## 2018-08-07 LAB — MAGNESIUM: Magnesium: 2.3 mg/dL (ref 1.7–2.4)

## 2018-08-07 MED ORDER — DIGOXIN 0.25 MG/ML IJ SOLN
0.2500 mg | Freq: Once | INTRAMUSCULAR | Status: AC
  Administered 2018-08-07: 0.25 mg via INTRAVENOUS
  Filled 2018-08-07: qty 2

## 2018-08-07 MED ORDER — CARVEDILOL 3.125 MG PO TABS
3.1250 mg | ORAL_TABLET | Freq: Once | ORAL | Status: AC
  Administered 2018-08-07: 3.125 mg via ORAL
  Filled 2018-08-07: qty 1

## 2018-08-07 MED ORDER — PERFLUTREN LIPID MICROSPHERE
1.0000 mL | INTRAVENOUS | Status: AC | PRN
  Administered 2018-08-07: 11:00:00 4 mL via INTRAVENOUS
  Filled 2018-08-07: qty 10

## 2018-08-07 MED ORDER — POTASSIUM CHLORIDE CRYS ER 20 MEQ PO TBCR
40.0000 meq | EXTENDED_RELEASE_TABLET | Freq: Every day | ORAL | Status: DC
  Filled 2018-08-07: qty 2

## 2018-08-07 MED ORDER — CARVEDILOL 6.25 MG PO TABS
6.2500 mg | ORAL_TABLET | Freq: Two times a day (BID) | ORAL | Status: DC
  Administered 2018-08-07: 6.25 mg via ORAL
  Filled 2018-08-07: qty 1

## 2018-08-07 MED ORDER — ENOXAPARIN SODIUM 150 MG/ML ~~LOC~~ SOLN
1.0000 mg/kg | Freq: Two times a day (BID) | SUBCUTANEOUS | Status: DC
  Filled 2018-08-07 (×2): qty 0.87

## 2018-08-07 MED ORDER — HEPARIN BOLUS VIA INFUSION
4000.0000 [IU] | Freq: Once | INTRAVENOUS | Status: AC
  Administered 2018-08-07: 4000 [IU] via INTRAVENOUS
  Filled 2018-08-07: qty 4000

## 2018-08-07 MED ORDER — SODIUM CHLORIDE 0.9% FLUSH
3.0000 mL | Freq: Two times a day (BID) | INTRAVENOUS | Status: DC
  Administered 2018-08-08 – 2018-08-10 (×5): 3 mL via INTRAVENOUS

## 2018-08-07 MED ORDER — HEPARIN (PORCINE) 25000 UT/250ML-% IV SOLN
1400.0000 [IU]/h | INTRAVENOUS | Status: DC
  Administered 2018-08-07 – 2018-08-08 (×3): 1400 [IU]/h via INTRAVENOUS
  Filled 2018-08-07 (×3): qty 250

## 2018-08-07 MED ORDER — SODIUM CHLORIDE 0.9% FLUSH: 3.0000 mL | INTRAVENOUS | Status: DC | PRN

## 2018-08-07 NOTE — Progress Notes (Signed)
Per Dr End we will be stopping Cardizem and amiodarone drip. Per Dr End: Parameters given if patients heart rate above 150 and sustaining to call him.

## 2018-08-07 NOTE — Progress Notes (Addendum)
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 fib with RVR previously on metoprolol.  Patient states that he stopped taking metoprolol 5 years ago due to bradycardia and hypotension.  Most recently he had significant emotional distress due to loss of his wife after multiple hospitalizations.  Patient states is affected him severely and was crying during interview at bedside.  His son is with him during my evaluation.  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  He has had episodes where heart rate was over 180.  Recently in the past 3 weeks he has developed bronchitis and has been treated with a Z-Pak and albuterol inhaler.  He had noticed 3+ pitting edema bilaterally and feeling congested with resultant 12 pound weight gain in the past 3 weeks.  He notes that over last several months he has been profoundly dyspneic while ambulating during work.  He denies constitutional symptoms, flulike illness or sick contacts.  In the ED he was noted to have atrial fibrillation with rapid ventricular response and nonsustained ventricular tachycardia.  Chest x-ray shows cephalization with Kerley B-lines consistent with interstitial edema.  Patient was upgraded to medical intensive care unit for Cardizem drip and ICU monitoring as well as evaluation for new onset CHF.   PAST MEDICAL HISTORY   Past Medical History:  Diagnosis Date  . A-fib [facility](HCC)      SURGICAL HISTORY   History reviewed. No pertinent surgical  history.   FAMILY HISTORY   No family history on file.   SOCIAL HISTORY   Social History   Tobacco Use  . Smoking status: Never Smoker  . Smokeless tobacco: Never Used  Substance Use Topics  . Alcohol use: Never    Frequency: Never  . Drug use: Never     MEDICATIONS   Current Medication:  Current Facility-Administered Medications:  .  0.9 %  sodium chloride infusion, 250 mL, Intravenous, PRN, Pyreddy, Pavan, MD .  acetaminophen (TYLENOL) tablet 650 mg, 650 mg, Oral, Q4H PRN, Pyreddy, Pavan, MD .  [EXPIRED] amiodarone (NEXTERONE PREMIX) 360-4.14 MG/200ML-% (1.8 mg/mL) IV infusion, 60 mg/hr, Intravenous, Continuous, Last Rate: 33.3 mL/hr at 08/06/18 2000, 60 mg/hr at 08/06/18 2000 **FOLLOWED BY** amiodarone (NEXTERONE PREMIX) 360-4.14 MG/200ML-% (1.8 mg/mL) IV infusion, 30 mg/hr, Intravenous, Continuous, Darci CurrentBrown, [place] N, MD, Last Rate: 16.67 mL/hr at 08/07/18 0600, 30 mg/hr at 08/07/18 0600 .  aspirin EC tablet 81 mg, 81 mg, Oral, Daily, Pyreddy, Pavan, MD, 81 mg at 08/07/18 0847 .  carvedilol (COREG) tablet 3.125 mg, 3.125 mg, Oral, BID WC, Pyreddy, Pavan, MD, 3.125 mg at 08/07/18 0738 .  diltiazem (CARDIZEM) 100 mg in dextrose 5 % 100 mL (1 mg/mL) infusion, 5-15 mg/hr, Intravenous, Titrated, Pyreddy, Pavan, MD, Last Rate: 10 mL/hr at 08/07/18 0600, 10 mg/hr at 08/07/18 0600 .  diltiazem (CARDIZEM) injection 10 mg, 10 mg, Intravenous, Once, Darci CurrentBrown, [place] N, MD .  enoxaparin (LOVENOX) injection 130 mg, 1 mg/kg, Subcutaneous, Q12H, Nazari, Walid A, RPH .  furosemide (LASIX) injection 60 mg, 60 mg, Intravenous, BID, Pyreddy, Pavan, MD, 60 mg at 08/07/18 0738 .  lisinopril (ZESTRIL) tablet 5 mg, 5 mg, Oral, Daily, Pyreddy, Pavan, MD, 5  mg at 08/06/18 1804 .  ondansetron (ZOFRAN) injection 4 mg, 4 mg, Intravenous, Q6H PRN, Pyreddy, Pavan, MD, 4 mg at 08/07/18 0738 .  potassium chloride SA (K-DUR) CR tablet 20 mEq, 20 mEq, Oral, Daily, Pyreddy, Pavan, MD, 20 mEq at 08/07/18 0847 .   sodium chloride flush (NS) 0.9 % injection 3 mL, 3 mL, Intravenous, Q12H, Pyreddy, Pavan, MD, 3 mL at 08/07/18 0850 .  sodium chloride flush (NS) 0.9 % injection 3 mL, 3 mL, Intravenous, PRN, Pyreddy, Pavan, MD .  traZODone (DESYREL) tablet 50 mg, 50 mg, Oral, QHS PRN, Awilda Bill, NP, 50 mg at 08/06/18 2243    ALLERGIES   Patient has no known allergies.    REVIEW OF SYSTEMS     10 point review of systems is negative except for fullness in the abdomen  PHYSICAL EXAMINATION   Vitals:   08/07/18 0700 08/07/18 0738  BP: 119/88 119/88  Pulse: (!) 44 (!) 130  Resp: 16   Temp:    SpO2: 98%     GENERAL: Emotional crying age-appropriate morbid obese male HEAD: Normocephalic, atraumatic.  EYES: Pupils equal, round, reactive to light.  No scleral icterus.  MOUTH: Moist mucosal membrane. NECK: Supple. No thyromegaly. No nodules. No JVD.  PULMONARY: Mild crackles at the bases bilaterally CARDIOVASCULAR: S1 and S2. Regular rate and rhythm. No murmurs, rubs, or gallops.  GASTROINTESTINAL: Soft, nontender, non-distended. No masses. Positive bowel sounds. No hepatosplenomegaly.  MUSCULOSKELETAL: No swelling, clubbing, or edema.  NEUROLOGIC: Mild distress due to acute illness SKIN:intact,warm,dry   LABS AND IMAGING   LAB RESULTS: Recent Labs  Lab 08/06/18 1316 08/07/18 0257  NA 137 138  K 4.1 3.8  CL 105 103  CO2 23 27  BUN 11 13  CREATININE 1.07 1.25*  GLUCOSE 138* 116*   Recent Labs  Lab 08/06/18 1316 08/07/18 0257  HGB 14.5 12.8*  HCT 45.9 41.8  WBC 7.0 7.6  PLT 164 192     IMAGING RESULTS: Dg Chest 2 View  Result Date: 08/06/2018 CLINICAL DATA:  Atrial fibrillation.  Chest pain. EXAM: CHEST - 2 VIEW COMPARISON:  None. FINDINGS: There is cardiomegaly with mild pulmonary vascular congestion, a small right effusion and a tiny left effusion. No consolidative infiltrates.  Bones are normal. IMPRESSION: Congestive heart failure. Electronically Signed   By:  Lorriane Shire M.D.   On: 08/06/2018 14:02      ASSESSMENT AND PLAN    -Multidisciplinary rounds held today     Atrial fibrillation with rapid ventricular response- -currently on Cardizem gtt. as well as amiodarone -Cardiology consultation placed-appreciate input-plan for cardioversion -oxygen as needed, lovenox for theraputic AC -Lasix 60 mg IV administered -follow up cardiac enzymes as indicated ICU monitoring   Probable new onset CHF -Clinically patient with JVD and 3+ pitting lower extremity edema -BNP significantly elevated -Status post transthoracic echo report pending -Possible takatsubo due to working overtime and loss of wife after repeated hospitalizations and surgeries  -continue Foley Catheter-assess need daily   GI/Nutrition GI PROPHYLAXIS as indicated DIET-->TF's as tolerated Constipation protocol as indicated  ENDO - ICU hypoglycemic\Hyperglycemia protocol -check FSBS per protocol   ELECTROLYTES -follow labs as needed -replace as needed -pharmacy consultation   DVT/GI PRX ordered -SCDs  TRANSFUSIONS AS NEEDED MONITOR FSBS ASSESS the need for LABS as needed   Critical care provider statement:    Critical care time (minutes):  32   Critical care time was exclusive of:  Separately billable procedures and treating other patients  Critical care was necessary to treat or prevent imminent or life-threatening deterioration of the following conditions:   Atrial fibrillation, morbid obesity, probable new onset CHF, multiple comorbid conditions   Critical care was time spent personally by me on the following activities:  Development of treatment plan with patient or surrogate, discussions with consultants, evaluation of patient's response to treatment, examination of patient, obtaining history from patient or surrogate, ordering and performing treatments and interventions, ordering and review of laboratory studies and re-evaluation of patient's condition.   I assumed direction of critical care for this patient from another provider in my specialty: no    This document was prepared using Dragon voice recognition software and may include unintentional dictation errors.    Vida Rigger, M.D.  Division of Pulmonary & Critical Care Medicine  [facility]

## 2018-08-07 NOTE — Progress Notes (Signed)
ANTICOAGULATION CONSULT NOTE - Initial Consult  Pharmacy Consult for Heparin Indication: atrial fibrillation  No Known Allergies  Patient Measurements: Height: 5\' 8"  (172.7 cm) Weight: 288 lb 2.3 oz (130.7 kg) IBW/kg (Calculated) : 68.4 Heparin Dosing Weight: 99.06  Vital Signs: BP: 113/88 (06/30 1200) Pulse Rate: 122 (06/30 1200)  Labs: Recent Labs    08/06/18 1316 08/06/18 1747 08/07/18 0257  HGB 14.5  --  12.8*  HCT 45.9  --  41.8  PLT 164  --  192  LABPROT  --  15.6*  --   INR  --  1.3*  --   HEPARINUNFRC  --  <0.10*  --   CREATININE 1.07  --  1.25*  TROPONINIHS 18*  22*  --   --     Estimated Creatinine Clearance: 89.2 mL/min (A) (by C-G formula based on SCr of 1.25 mg/dL (H)).   Medical History: Past Medical History:  Diagnosis Date  . A-fib (HCC)     Medications:  No medications prior to admission.    Assessment: RE is 72 YOM admitted on 08/06/18 for Afib. Pharmacy was consulted initially for Enoxaparin dosing which was never administered. Pharmacy has now been requested for a consult on heparin dosing. A 2D echocardiogram was done on 6/30 with results pending. PMH significant for TEE/DCCV in 2015 and maintaining SR since that time and until undergoing severe emotional distress s/p recent loss of his wife (~06/2018), no other cardiac history or significant medical history per patient, and who is being seen today for the evaluation of Afib and possible CHF at the request of Dr. Estanislado Pandy  -Patient also on amiodarone drip for afib -Hgb 14.5>12.8 -Hct 45.9>41.8 -Platelets 164>192  -Using Heparin dosing weight to dose as TBW is >125% IBW.  Goal of Therapy:  Heparin level 0.3-0.7 units/ml Monitor platelets by anticoagulation protocol: Yes   Plan:  Give 4000 units bolus x 1 Start heparin infusion at 1400 units/hr Check anti-Xa level in 6 hours and daily while on heparin Continue to monitor H&H and platelets Pharmacy will continue to monitor and adjust  accordingly.   Marisa Cyphers, PharmD Candidate 08/07/2018,1:20 PM

## 2018-08-07 NOTE — Progress Notes (Signed)
Per patient refusing to take levenox explained the importance. Will let Dr End know that patient refuses. Per MD will place orders for heparin drip.

## 2018-08-07 NOTE — Progress Notes (Signed)
   Spoke with patient and son (at bedside) regarding TEE and cardioversion to further explain procedure given hesitation to sign consent form.  After further discussion, patient is agreeable to undergo TEE/DCCV procedure tomorrow morning at 8:30 AM with Dr. Rockey Situ.   Obtained signed consent for release of patient's previous medical records from [facility]. Completed and signed consent has been faxed to [facility] as of 6/30 at 4:30PM and will call to follow-up on receipt of records and update patient's medical history upon receipt of records.   Obtained contact information for patient's son Stormy Card, cell: [phone]) and will provide him with update that, due to pandemic precautions, cannot join patient in specials recovery area following TEE/DCCV.  SignedArvil Chaco, PA-C 08/07/2018, 4:50 PM Pager [phone]

## 2018-08-07 NOTE — Telephone Encounter (Signed)
Informed son that, due to pandemic precautions in place, he is not able to join his father in specials post procedure recovery area after planned TEE/DCCV. Son verbalized his understanding.
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
## 2018-08-07 NOTE — Progress Notes (Signed)
[place] [facility] [place] NAME: Kenneth Ferguson    MR#:  [id number]  DATE OF BIRTH:  1965/07/01  SUBJECTIVE:  CHIEF COMPLAINT:   Chief Complaint  Patient presents with  . Atrial Fibrillation  In A. fib RVR with heart rate in the 140s.  Amiodarone drip. Still feels fatigued.  REVIEW OF SYSTEMS:    Review of Systems  Constitutional: Positive for malaise/fatigue. Negative for chills and fever.  HENT: Negative for sore throat.   Eyes: Negative for blurred vision, double vision and pain.  Respiratory: Negative for cough, hemoptysis, shortness of breath and wheezing.   Cardiovascular: Negative for chest pain, palpitations, orthopnea and leg swelling.  Gastrointestinal: Negative for abdominal pain, constipation, diarrhea, heartburn, nausea and vomiting.  Genitourinary: Negative for dysuria and hematuria.  Musculoskeletal: Negative for back pain and joint pain.  Skin: Negative for rash.  Neurological: Negative for sensory change, speech change, focal weakness and headaches.  Endo/Heme/Allergies: Does not bruise/bleed easily.  Psychiatric/Behavioral: Negative for depression. The patient is not nervous/anxious.     DRUG ALLERGIES:  No Known Allergies  VITALS:  Blood pressure 121/90, pulse 92, temperature 98.2 F (36.8 C), temperature source Oral, resp. rate (!) 21, height 5\' 8"  (1.727 m), weight 130.7 kg, SpO2 93 %.  PHYSICAL EXAMINATION:   Physical Exam  GENERAL:  53 y.o.-year-old patient lying in the bed .  Obese EYES: Pupils equal, round, reactive to light and accommodation. No scleral icterus. Extraocular muscles intact.  HEENT: Head atraumatic, normocephalic. Oropharynx and nasopharynx clear.  NECK:  Supple, no jugular venous distention. No thyroid enlargement, no tenderness.  LUNGS: Normal breath sounds bilaterally, no wheezing, rales, rhonchi. No use of accessory muscles of respiration.  CARDIOVASCULAR: Irregularly irregular and  tachycardic ABDOMEN: Soft, nontender, nondistended. Bowel sounds present. No organomegaly or mass.  EXTREMITIES: No cyanosis, clubbing or edema b/l.    NEUROLOGIC: Cranial nerves II through XII are intact. No focal Motor or sensory deficits b/l.   PSYCHIATRIC: The patient is alert and oriented x 3.  SKIN: No obvious rash, lesion, or ulcer.   LABORATORY PANEL:   CBC Recent Labs  Lab 08/07/18 0257  WBC 7.6  HGB 12.8*  HCT 41.8  PLT 192   ------------------------------------------------------------------------------------------------------------------ Chemistries  Recent Labs  Lab 08/06/18 1316 08/07/18 0257  NA 137 138  K 4.1 3.8  CL 105 103  CO2 23 27  GLUCOSE 138* 116*  BUN 11 13  CREATININE 1.07 1.25*  CALCIUM 8.7* 8.4*  MG  --  2.3  AST 35  --   ALT 24  --   ALKPHOS 77  --   BILITOT 2.0*  --    ------------------------------------------------------------------------------------------------------------------  Cardiac Enzymes No results for input(s): TROPONINI in the last 168 hours. ------------------------------------------------------------------------------------------------------------------  RADIOLOGY:  Dg Chest 2 View  Result Date: 08/06/2018 CLINICAL DATA:  Atrial fibrillation.  Chest pain. EXAM: CHEST - 2 VIEW COMPARISON:  None. FINDINGS: There is cardiomegaly with mild pulmonary vascular congestion, a small right effusion and a tiny left effusion. No consolidative infiltrates.  Bones are normal. IMPRESSION: Congestive heart failure. Electronically Signed   By: Lorriane Shire M.D.   On: 08/06/2018 14:02     ASSESSMENT AND PLAN:   *Atrial fibrillation with rapid ventricular rate.  On amiodarone drip and continues to be tachycardic.  Blood pressure low normal limiting other rate control medications. Discussed with cardiology team.  On full dose Lovenox. Echocardiogram pending. Patient had severe fatigue and bradycardia with metoprolol in  the  past.  *Acute on chronic diastolic CHF.  Decompensated with A. fib.  Echo pending. IV Lasix.  Monitor input and output.  Repeat BMP in the morning.  *Nonsustained ventricular tachycardia On amiodarone drip  All the records are reviewed and case discussed with Care Management/Social Worker Management plans discussed with the patient, family and they are in agreement.  CODE STATUS: FULL CODE  DVT Prophylaxis: SCDs  TOTAL TIME TAKING CARE OF THIS PATIENT: 35 minutes.   POSSIBLE D/C IN 2-3 DAYS, DEPENDING ON CLINICAL CONDITION.  Molinda Bailiff Tomekia Helton M.D on 08/07/2018 at 10:47 AM  Between 7am to 6pm - Pager - [phone]  After 6pm go to [email or web address] - password [email or web address]  [facility] [place] [facility]  Office  [phone]  CC: Primary care physician; Patient, [name]  Note: This dictation was prepared with Dragon dictation along with smaller phrase technology. Any transcriptional errors that result from this process are unintentional.

## 2018-08-07 NOTE — Progress Notes (Signed)
ANTICOAGULATION CONSULT NOTE - Initial Consult  Pharmacy Consult for Heparin Indication: atrial fibrillation  No Known Allergies  Patient Measurements: Height: 5\' 8"  (172.7 cm) Weight: 288 lb 2.3 oz (130.7 kg) IBW/kg (Calculated) : 68.4 Heparin Dosing Weight: 99.06  Vital Signs: Temp: 97.8 F (36.6 C) (06/30 1900) Temp Source: Oral (06/30 1900) BP: 102/74 (06/30 2200) Pulse Rate: 81 (06/30 2200)  Labs: Recent Labs    08/06/18 1316 08/06/18 1747 08/07/18 0257 08/07/18 2000  HGB 14.5  --  12.8*  --   HCT 45.9  --  41.8  --   PLT 164  --  192  --   LABPROT  --  15.6*  --   --   INR  --  1.3*  --   --   HEPARINUNFRC  --  <0.10*  --  0.53  CREATININE 1.07  --  1.25*  --   TROPONINIHS 18*  22*  --   --   --     Estimated Creatinine Clearance: 89.2 mL/min (A) (by C-G formula based on SCr of 1.25 mg/dL (H)).   Medical History: Past Medical History:  Diagnosis Date  . A-fib (HCC)     Medications:  No medications prior to admission.    Assessment: RE is 51 YOM admitted on 08/06/18 for Afib. Pharmacy was consulted initially for Enoxaparin dosing which was never administered. Pharmacy has now been requested for a consult on heparin dosing. A 2D echocardiogram was done on 6/30 with results pending. PMH significant for TEE/DCCV in 2015 and maintaining SR since that time and until undergoing severe emotional distress s/p recent loss of his wife (~06/2018), no other cardiac history or significant medical history per patient, and who is being seen today for the evaluation of Afib and possible CHF at the request of Dr. Estanislado Pandy  -Patient also on amiodarone drip for afib -Hgb 14.5>12.8 -Hct 45.9>41.8 -Platelets 164>192  -Using Heparin dosing weight to dose as TBW is >125% IBW.  Goal of Therapy:  Heparin level 0.3-0.7 units/ml Monitor platelets by anticoagulation protocol: Yes   Plan:  06/30 @ 2000 HL 0.53 therapeutic. Will continue current rate of 1400 units/hr and will  recheck HL @ 0200, Hgb has trended down by roughly 2 units will continue to monitor w/ am labs.  Tobie Lords, PharmD, BCPS Clinical Pharmacist 08/07/2018,10:22 PM

## 2018-08-07 NOTE — Progress Notes (Signed)
*  PRELIMINARY RESULTS* Echocardiogram 2D Echocardiogram has been performed.  Kenneth Ferguson 08/07/2018, 10:42 AM

## 2018-08-07 NOTE — Progress Notes (Signed)
At this time patient does not want to sign consent forms for TEE and cardioversion procedure. He is aware that it is scheduled for tomorrow. NPO after midnight. He does not at this time have any further questions for doctor. He states that he is scared and that he is hoping the new medications that he was placed on will help him convert to a normal sinus rhythm.

## 2018-08-07 NOTE — Progress Notes (Addendum)
Pharmacy Electrolyte Monitoring Consult:  Pharmacy consulted to assist in monitoring and replacing electrolytes in this 53 y.o. male admitted on 08/06/2018 with Atrial Fibrillation   Labs:  Sodium (mmol/L)  Date Value  08/07/2018 138   Potassium (mmol/L)  Date Value  08/07/2018 3.8   Magnesium (mg/dL)  Date Value  08/07/2018 2.3   Calcium (mg/dL)  Date Value  08/07/2018 8.4 (L)   Albumin (g/dL)  Date Value  08/06/2018 4.0    Assessment/Plan: Patient ordered furosemide 60mg  IV BID. Increase scheduled potassium to potassium 40mg  daily.   Will obtain electrolytes with am labs.   Pharmacy will continue to monitor and adjust per consult.   Simpson,Michael L 08/07/2018 5:36 PM

## 2018-08-08 ENCOUNTER — Inpatient Hospital Stay (HOSPITAL_COMMUNITY)
Admit: 2018-08-08 | Discharge: 2018-08-08 | Disposition: A | Discharge status: Home / Self Care | Payer: 59 | Attending: Physician Assistant | Admitting: Physician Assistant

## 2018-08-08 ENCOUNTER — Encounter
Admission: EM | Disposition: A | Discharge status: Home / Self Care | Payer: Self-pay | Source: Home / Self Care | Attending: Internal Medicine

## 2018-08-08 ENCOUNTER — Inpatient Hospital Stay: Payer: 59 | Admitting: Anesthesiology

## 2018-08-08 ENCOUNTER — Encounter: Payer: Self-pay | Admitting: Anesthesiology

## 2018-08-08 DIAGNOSIS — I361 Nonrheumatic tricuspid (valve) insufficiency: Secondary | ICD-10-CM

## 2018-08-08 DIAGNOSIS — I420 Dilated cardiomyopathy: Secondary | ICD-10-CM

## 2018-08-08 DIAGNOSIS — I340 Nonrheumatic mitral (valve) insufficiency: Secondary | ICD-10-CM

## 2018-08-08 DIAGNOSIS — F4323 Adjustment disorder with mixed anxiety and depressed mood: Secondary | ICD-10-CM

## 2018-08-08 HISTORY — PX: TEE WITHOUT CARDIOVERSION: SHX5443

## 2018-08-08 HISTORY — PX: CARDIOVERSION: SHX1299

## 2018-08-08 LAB — BASIC METABOLIC PANEL
Anion gap: 9 (ref 5–15)
BUN: 12 mg/dL (ref 6–20)
CO2: 25 mmol/L (ref 22–32)
Calcium: 8.2 mg/dL — ABNORMAL LOW (ref 8.9–10.3)
Chloride: 102 mmol/L (ref 98–111)
Creatinine, Ser: 1.26 mg/dL — ABNORMAL HIGH (ref 0.61–1.24)
GFR calc Af Amer: >60 mL/min (ref >60)
GFR calc non Af Amer: >60 mL/min (ref >60)
Glucose, Bld: 99 mg/dL (ref 70–99)
Potassium: 3.5 mmol/L (ref 3.5–5.1)
Sodium: 136 mmol/L (ref 135–145)

## 2018-08-08 LAB — HIV ANTIBODY (ROUTINE TESTING W REFLEX): HIV Screen 4th Generation wRfx: NONREACTIVE

## 2018-08-08 LAB — CBC WITH DIFFERENTIAL/PLATELET
Abs Immature Granulocytes: 0 10*3/uL (ref 0.00–0.07)
Basophils Absolute: 0 10*3/uL (ref 0.0–0.1)
Basophils Relative: 1 %
Eosinophils Absolute: 0.1 10*3/uL (ref 0.0–0.5)
Eosinophils Relative: 2 %
HCT: 41.5 % (ref 39.0–52.0)
Hemoglobin: 13.3 g/dL (ref 13.0–17.0)
Immature Granulocytes: 0 %
Lymphocytes Relative: 27 %
Lymphs Abs: 1.7 10*3/uL (ref 0.7–4.0)
MCH: 30 pg (ref 26.0–34.0)
MCHC: 32 g/dL (ref 30.0–36.0)
MCV: 93.7 fL (ref 80.0–100.0)
Monocytes Absolute: 0.6 10*3/uL (ref 0.1–1.0)
Monocytes Relative: 10 %
Neutro Abs: 3.8 10*3/uL (ref 1.7–7.7)
Neutrophils Relative %: 60 %
Platelets: 175 10*3/uL (ref 150–400)
RBC: 4.43 MIL/uL (ref 4.22–5.81)
RDW: 14.6 % (ref 11.5–15.5)
WBC: 6.3 10*3/uL (ref 4.0–10.5)
nRBC: 0 % (ref 0.0–0.2)

## 2018-08-08 LAB — HEPARIN LEVEL (UNFRACTIONATED): Heparin Unfractionated: 0.53 IU/mL (ref 0.30–0.70)

## 2018-08-08 LAB — MAGNESIUM: Magnesium: 2.2 mg/dL (ref 1.7–2.4)

## 2018-08-08 SURGERY — ECHOCARDIOGRAM, TRANSESOPHAGEAL
Anesthesia: Moderate Sedation

## 2018-08-08 SURGERY — CARDIOVERSION
Anesthesia: General

## 2018-08-08 MED ORDER — LIDOCAINE VISCOUS HCL 2 % MT SOLN
OROMUCOSAL | Status: AC
  Filled 2018-08-08: qty 15

## 2018-08-08 MED ORDER — SODIUM CHLORIDE FLUSH 0.9 % IV SOLN
INTRAVENOUS | Status: AC
  Filled 2018-08-08: qty 10

## 2018-08-08 MED ORDER — PROPOFOL 10 MG/ML IV BOLUS
INTRAVENOUS | Status: DC | PRN
  Administered 2018-08-08 (×5): 10 mg via INTRAVENOUS
  Administered 2018-08-08: 40 mg via INTRAVENOUS
  Administered 2018-08-08: 10 mg via INTRAVENOUS

## 2018-08-08 MED ORDER — DIGOXIN 0.25 MG/ML IJ SOLN
0.2500 mg | Freq: Once | INTRAMUSCULAR | Status: AC
  Administered 2018-08-08: 0.25 mg via INTRAVENOUS
  Filled 2018-08-08 (×2): qty 2

## 2018-08-08 MED ORDER — BISOPROLOL FUMARATE 5 MG PO TABS
5.0000 mg | ORAL_TABLET | Freq: Two times a day (BID) | ORAL | Status: DC
  Administered 2018-08-08 – 2018-08-10 (×5): 5 mg via ORAL
  Filled 2018-08-08 (×7): qty 1

## 2018-08-08 MED ORDER — DIGOXIN 250 MCG PO TABS
0.2500 mg | ORAL_TABLET | Freq: Every day | ORAL | Status: DC
  Administered 2018-08-09 – 2018-08-10 (×2): 0.25 mg via ORAL
  Filled 2018-08-08 (×2): qty 1

## 2018-08-08 MED ORDER — WARFARIN - PHARMACIST DOSING INPATIENT: Freq: Every day | Status: DC

## 2018-08-08 MED ORDER — DILTIAZEM HCL 30 MG PO TABS: 30.0000 mg | ORAL_TABLET | Freq: Four times a day (QID) | ORAL | Status: DC

## 2018-08-08 MED ORDER — WARFARIN SODIUM 5 MG PO TABS
5.0000 mg | ORAL_TABLET | Freq: Once | ORAL | Status: AC
  Administered 2018-08-08: 5 mg via ORAL
  Filled 2018-08-08: qty 1

## 2018-08-08 MED ORDER — BUTAMBEN-TETRACAINE-BENZOCAINE 2-2-14 % EX AERO
INHALATION_SPRAY | CUTANEOUS | Status: AC
  Filled 2018-08-08: qty 5

## 2018-08-08 MED ORDER — MIDAZOLAM HCL 2 MG/2ML IJ SOLN
INTRAMUSCULAR | Status: DC | PRN
  Administered 2018-08-08: 2 mg via INTRAVENOUS

## 2018-08-08 MED ORDER — POTASSIUM CHLORIDE CRYS ER 20 MEQ PO TBCR
40.0000 meq | EXTENDED_RELEASE_TABLET | Freq: Every day | ORAL | Status: DC
  Administered 2018-08-09: 40 meq via ORAL
  Filled 2018-08-08: qty 2

## 2018-08-08 MED ORDER — POTASSIUM CHLORIDE CRYS ER 20 MEQ PO TBCR
40.0000 meq | EXTENDED_RELEASE_TABLET | Freq: Two times a day (BID) | ORAL | Status: AC
  Administered 2018-08-08 (×2): 40 meq via ORAL
  Filled 2018-08-08: qty 2

## 2018-08-08 NOTE — Progress Notes (Signed)
*  PRELIMINARY RESULTS* Echocardiogram Echocardiogram Transesophageal has been performed.  Sherrie Sport 08/08/2018, 9:20 AM

## 2018-08-08 NOTE — Progress Notes (Signed)
ANTICOAGULATION CONSULT NOTE - Initial Consult  Pharmacy Consult for Heparin Indication: atrial fibrillation  No Known Allergies  Patient Measurements: Height: 5\' 8"  (172.7 cm) Weight: 288 lb 2.3 oz (130.7 kg) IBW/kg (Calculated) : 68.4 Heparin Dosing Weight: 99.06  Vital Signs: Temp: 97.4 F (36.3 C) (07/01 0000) Temp Source: Axillary (07/01 0000) BP: 114/73 (07/01 0200) Pulse Rate: 64 (07/01 0200)  Labs: Recent Labs    08/06/18 1316 08/06/18 1747 08/07/18 0257 08/07/18 2000 08/08/18 0155  HGB 14.5  --  12.8*  --  13.3  HCT 45.9  --  41.8  --  41.5  PLT 164  --  192  --  175  LABPROT  --  15.6*  --   --   --   INR  --  1.3*  --   --   --   HEPARINUNFRC  --  <0.10*  --  0.53 0.53  CREATININE 1.07  --  1.25*  --  1.26*  TROPONINIHS 18*  22*  --   --   --   --     Estimated Creatinine Clearance: 88.4 mL/min (A) (by C-G formula based on SCr of 1.26 mg/dL (H)).   Medical History: Past Medical History:  Diagnosis Date  . A-fib (HCC)     Medications:  No medications prior to admission.    Assessment: RE is 74 YOM admitted on 08/06/18 for Afib. Pharmacy was consulted initially for Enoxaparin dosing which was never administered. Pharmacy has now been requested for a consult on heparin dosing. A 2D echocardiogram was done on 6/30 with results pending. PMH significant for TEE/DCCV in 2015 and maintaining SR since that time and until undergoing severe emotional distress s/p recent loss of his wife (~06/2018), no other cardiac history or significant medical history per patient, and who is being seen today for the evaluation of Afib and possible CHF at the request of Dr. Estanislado Ferguson  -Patient also on amiodarone drip for afib -Hgb 14.5>12.8 -Hct 45.9>41.8 -Platelets 164>192  -Using Heparin dosing weight to dose as TBW is >125% IBW.  Goal of Therapy:  Heparin level 0.3-0.7 units/ml Monitor platelets by anticoagulation protocol: Yes   Plan:  07/01 @ 0200 HL 0.53  therapeutic. Will continue current rate of 1400 units/hr and will recheck HL w/ am labs. Hgb stable will continue to monitor w/ am labs.  Kenneth Ferguson, PharmD, BCPS Clinical Pharmacist 08/08/2018,2:52 AM

## 2018-08-08 NOTE — Transfer of Care (Signed)
Immediate Anesthesia Transfer of Care Note  Patient: Kenneth Ferguson  Procedure(s) Performed: CARDIOVERSION (N/A ) TRANSESOPHAGEAL ECHOCARDIOGRAM (TEE) (N/A )  Patient Location: PACU and Cath Lab  Anesthesia Type:General  Level of Consciousness: awake and alert   Airway & Oxygen Therapy: Patient Spontanous Breathing and Patient connected to nasal cannula oxygen  Post-op Assessment: Report given to RN and Post -op Vital signs reviewed and stable  Post vital signs: Reviewed and stable  Last Vitals:  Vitals Value Taken Time  BP 86/64   Temp    Pulse 152   Resp 20   SpO2 95     Last Pain:  Vitals:   08/08/18 0830  TempSrc: Oral  PainSc: 0-No pain         Complications: No apparent anesthesia complications

## 2018-08-08 NOTE — Progress Notes (Signed)
[facility] for Heparin/Warfarin  Indication: atrial fibrillation; left atrial appendage thrombus   No Known Allergies  Patient Measurements: Height: 5\' 8"  (172.7 cm) Weight: 288 lb 2.3 oz (130.7 kg) IBW/kg (Calculated) : 68.4 Heparin Dosing Weight: 99.06  Vital Signs: Temp: 97.7 F (36.5 C) (07/01 0830) Temp Source: Oral (07/01 0830) BP: 118/73 (07/01 1329) Pulse Rate: 100 (07/01 1329)  Labs: Recent Labs    08/06/18 1316 08/06/18 1747 08/07/18 0257 08/07/18 2000 08/08/18 0155  HGB 14.5  --  12.8*  --  13.3  HCT 45.9  --  41.8  --  41.5  PLT 164  --  192  --  175  LABPROT  --  15.6*  --   --   --   INR  --  1.3*  --   --   --   HEPARINUNFRC  --  <0.10*  --  0.53 0.53  CREATININE 1.07  --  1.25*  --  1.26*  TROPONINIHS 18*  22*  --   --   --   --     Estimated Creatinine Clearance: 88.4 mL/min (A) (by C-G formula based on SCr of 1.26 mg/dL (H)).   Medical History: Past Medical History:  Diagnosis Date  . A-fib (HCC)     Medications:  No medications prior to admission.    Assessment: RE is 64 YOM admitted on 08/06/18 for Afib. Pharmacy was consulted initially for Enoxaparin dosing which was never administered. Pharmacy has now been requested for a consult on heparin dosing. A 2D echocardiogram was done on 6/30 with results pending. PMH significant for TEE/DCCV in 2015 and maintaining SR since that time and until undergoing severe emotional distress s/p recent loss of his wife (~06/2018), no other cardiac history or significant medical history per patient, and who is being seen today for the evaluation of Afib and possible CHF at the request of Dr. Estanislado Pandy  -Using Heparin dosing weight to dose as TBW is >125% IBW.  Goal of Therapy:  INR 2-3 Heparin level 0.3-0.7 units/ml Monitor platelets by anticoagulation protocol: Yes   Plan:  Heparin: Continue heparin 1400 units/hr. Will obtain anti-Xa level with am labs.   Warfarin:  will initiate warfarin 5mg  daily. Will obtain INR with am labs. Will plan to bridge for a minimum of 5 days and two consecutive INRs in range.   Pharmacy will continue to monitor and adjust per consult.   Currie Paris , RPh  08/08/2018,4:35 PM

## 2018-08-08 NOTE — Anesthesia Post-op Follow-up Note (Signed)
Anesthesia QCDR form completed.        

## 2018-08-08 NOTE — Progress Notes (Signed)
Pharmacy Electrolyte Monitoring Consult:  Pharmacy consulted to assist in monitoring and replacing electrolytes in this 53 y.o. male admitted on 08/06/2018 with Atrial Fibrillation   Labs:  Sodium (mmol/L)  Date Value  08/08/2018 136   Potassium (mmol/L)  Date Value  08/08/2018 3.5   Magnesium (mg/dL)  Date Value  08/08/2018 2.2   Calcium (mg/dL)  Date Value  08/08/2018 8.2 (L)   Albumin (g/dL)  Date Value  08/06/2018 4.0    Assessment/Plan: Patient ordered furosemide 60mg  IV BID. Potassium 57mEq PO BID x 2 doses.   Will obtain electrolytes with am labs.   Goal potassium ~ 4 and goal magnesium ~ 2.   Pharmacy will continue to monitor and adjust per consult.   Etienne Mowers L 08/08/2018 4:40 PM

## 2018-08-08 NOTE — Anesthesia Preprocedure Evaluation (Signed)
Anesthesia Evaluation  Patient identified by MRN, date of birth, ID band Patient awake    Reviewed: Allergy & Precautions, H&P , NPO status , Patient's Chart, lab work & pertinent test results  History of Anesthesia Complications Negative for: history of anesthetic complications  Airway Mallampati: III  TM Distance: <3 FB Neck ROM: limited    Dental  (+) Chipped, Poor Dentition, Missing   Pulmonary neg pulmonary ROS, neg shortness of breath,           Cardiovascular Exercise Tolerance: Good (-) Past MI + dysrhythmias Atrial Fibrillation      Neuro/Psych negative neurological ROS  negative psych ROS   GI/Hepatic negative GI ROS, Neg liver ROS, neg GERD  ,  Endo/Other  negative endocrine ROS  Renal/GU negative Renal ROS  negative genitourinary   Musculoskeletal   Abdominal   Peds  Hematology negative hematology ROS (+)   Anesthesia Other Findings Past Medical History: No date: A-fib [facility])  History reviewed. No pertinent surgical history.  BMI    Body Mass Index: 43.81 kg/m      Reproductive/Obstetrics negative OB ROS                             Anesthesia Physical Anesthesia Plan  ASA: IV  Anesthesia Plan: General   Post-op Pain Management:    Induction: Intravenous  PONV Risk Score and Plan: Propofol infusion and TIVA  Airway Management Planned: Natural Airway and Nasal Cannula  Additional Equipment:   Intra-op Plan:   Post-operative Plan:   Informed Consent: I have reviewed the patients History and Physical, chart, labs and discussed the procedure including the risks, benefits and alternatives for the proposed anesthesia with the patient or authorized representative who has indicated his/her understanding and acceptance.     Dental Advisory Given  Plan Discussed with: Anesthesiologist, CRNA and Surgeon  Anesthesia Plan Comments: (Patient consented for risks of  anesthesia including but not limited to:  - adverse reactions to medications - risk of intubation if required - damage to teeth, lips or other oral mucosa - sore throat or hoarseness - Damage to heart, brain, lungs or loss of life  Patient voiced understanding.)        Anesthesia Quick Evaluation

## 2018-08-08 NOTE — Anesthesia Postprocedure Evaluation (Signed)
Anesthesia Post Note  Patient: Kenneth Ferguson  Procedure(s) Performed: CARDIOVERSION (N/A ) TRANSESOPHAGEAL ECHOCARDIOGRAM (TEE) (N/A )  Patient location during evaluation: Cath Lab Anesthesia Type: General Level of consciousness: awake and alert Pain management: pain level controlled Vital Signs Assessment: post-procedure vital signs reviewed and stable Respiratory status: spontaneous breathing, nonlabored ventilation, respiratory function stable and patient connected to nasal cannula oxygen Cardiovascular status: blood pressure returned to baseline and stable Postop Assessment: no apparent nausea or vomiting Anesthetic complications: no     Last Vitals:  Vitals:   08/08/18 0945 08/08/18 1000  BP: 97/70 102/76  Pulse: (!) 162 85  Resp: (!) 23 16  Temp:    SpO2: 94% 94%    Last Pain:  Vitals:   08/08/18 1000  TempSrc:   PainSc: 0-No pain                 Precious Haws Helana Macbride

## 2018-08-08 NOTE — Anesthesia Procedure Notes (Signed)
Performed by: Aloysuis Ribaudo, CRNA Pre-anesthesia Checklist: Patient identified, Suction available, Emergency Drugs available, Patient being monitored and Timeout performed Patient Re-evaluated:Patient Re-evaluated prior to induction Oxygen Delivery Method: Nasal cannula Induction Type: IV induction       

## 2018-08-08 NOTE — Progress Notes (Signed)
[facility] [employer] NAME: Kenneth Ferguson    MR#:  [id number]  DATE OF BIRTH:  [birth year]  SUBJECTIVE:  CHIEF COMPLAINT:   Chief Complaint  Patient presents with  . Atrial Fibrillation   And had TEE in the morning. Continues to have A. fib with RVR in the 150s.  Feels like his orthopnea and lower extremity edema have improved  REVIEW OF SYSTEMS:    Review of Systems  Constitutional: Positive for malaise/fatigue. Negative for chills and fever.  HENT: Negative for sore throat.   Eyes: Negative for blurred vision, double vision and pain.  Respiratory: Negative for cough, hemoptysis, shortness of breath and wheezing.   Cardiovascular: Negative for chest pain, palpitations, orthopnea and leg swelling.  Gastrointestinal: Negative for abdominal pain, constipation, diarrhea, heartburn, nausea and vomiting.  Genitourinary: Negative for dysuria and hematuria.  Musculoskeletal: Negative for back pain and joint pain.  Skin: Negative for rash.  Neurological: Negative for sensory change, speech change, focal weakness and headaches.  Endo/Heme/Allergies: Does not bruise/bleed easily.  Psychiatric/Behavioral: Negative for depression. The patient is not nervous/anxious.    DRUG ALLERGIES:  No Known Allergies  VITALS:  Blood pressure 102/76, pulse 85, temperature 97.7 F (36.5 C), temperature source Oral, resp. rate 16, height 5\' 8"  (1.727 m), weight 130.7 kg, SpO2 94 %.  PHYSICAL EXAMINATION:   Physical Exam  GENERAL:  53 y.o.-year-old patient lying in the bed .  Obese EYES: Pupils equal, round, reactive to light and accommodation. No scleral icterus. Extraocular muscles intact.  HEENT: Head atraumatic, normocephalic. Oropharynx and nasopharynx clear.  NECK:  Supple, no jugular venous distention. No thyroid enlargement, no tenderness.  LUNGS: Normal breath sounds bilaterally, no wheezing, rales, rhonchi. No use of accessory muscles of  respiration.  CARDIOVASCULAR: Irregularly irregular and tachycardic ABDOMEN: Soft, nontender, nondistended. Bowel sounds present. No organomegaly or mass.  EXTREMITIES: No cyanosis, clubbing or edema b/l.    NEUROLOGIC: Cranial nerves II through XII are intact. No focal Motor or sensory deficits b/l.   PSYCHIATRIC: The patient is alert and oriented x 3.  SKIN: No obvious rash, lesion, or ulcer.   LABORATORY PANEL:   CBC Recent Labs  Lab 08/08/18 0155  WBC 6.3  HGB 13.3  HCT 41.5  PLT 175   ------------------------------------------------------------------------------------------------------------------ Chemistries  Recent Labs  Lab 08/06/18 1316  08/08/18 0155  NA 137   < > 136  K 4.1   < > 3.5  CL 105   < > 102  CO2 23   < > 25  GLUCOSE 138*   < > 99  BUN 11   < > 12  CREATININE 1.07   < > 1.26*  CALCIUM 8.7*   < > 8.2*  MG  --    < > 2.2  AST 35  --   --   ALT 24  --   --   ALKPHOS 77  --   --   BILITOT 2.0*  --   --    < > = values in this interval not displayed.   ------------------------------------------------------------------------------------------------------------------  Cardiac Enzymes No results for input(s): TROPONINI in the last 168 hours. ------------------------------------------------------------------------------------------------------------------  RADIOLOGY:  Dg Chest 2 View  Result Date: 08/06/2018 CLINICAL DATA:  Atrial fibrillation.  Chest pain. EXAM: CHEST - 2 VIEW COMPARISON:  None. FINDINGS: There is cardiomegaly with mild pulmonary vascular congestion, a small right effusion and a tiny left effusion. No consolidative infiltrates.  Bones are normal. IMPRESSION: Congestive  heart failure. Electronically Signed   By: Francene Boyers M.D.   On: 08/06/2018 14:02     ASSESSMENT AND PLAN:   *Atrial fibrillation with rapid ventricular rate.   Was on amiodarone drip.  Stopped after TEE showed left atrial appendage thrombus Discussed with Dr.  Mariah Milling of cardiology. Started on digoxin and bisoprolol 5 mg twice daily Continue stepdown care  *Left atrial appendage thrombus Start heparin drip.  Coumadin.  *Acute on chronic systolic CHF. On IV Lasix and diuresed -3.5 L. Continue diuresis On beta-blockers.  Start ACE inhibitor's if blood pressure tolerates.  *Nonsustained ventricular tachycardia No further issues since admission  All the records are reviewed and case discussed with Care Management/Social Worker Management plans discussed with the patient, family and they are in agreement.  CODE STATUS: FULL CODE  TOTAL TIME TAKING CARE OF THIS PATIENT: 35 minutes.   POSSIBLE D/C IN 2-3 DAYS, DEPENDING ON CLINICAL CONDITION.  Kenneth Ferguson M.D on 08/08/2018 at 12:17 PM  Between 7am to 6pm - Pager - [phone]  After 6pm go to [email or web address] - password [email or web address]  [facility]  Office  [phone]  CC: Primary care physician; Patient, [name]  Note: This dictation was prepared with Dragon dictation along with smaller phrase technology. Any transcriptional errors that result from this process are unintentional.

## 2018-08-08 NOTE — Progress Notes (Addendum)
Progress Note  Patient Name: Kenneth Ferguson Date of Encounter: 08/08/2018  Primary Cardiologist: Yvonne Kendall, MD , [facility]  Subjective   Transesophageal echo this morning confirming left atrial appendage thrombus Left atrial spontaneous contrast noted with at least moderately dilated left atrium Tolerated the procedure well, very anxious prior to the procedure Heart rate continues to run 130, atrial fibrillation  Inpatient Medications    Scheduled Meds: . [MAR Hold] aspirin EC  81 mg Oral Daily  . butamben-tetracaine-benzocaine      . digoxin  0.25 mg Intravenous Once  . [START ON 08/09/2018] digoxin  0.25 mg Oral Daily  . diltiazem  30 mg Oral Q6H  . [MAR Hold] furosemide  60 mg Intravenous BID  . lidocaine      . [MAR Hold] potassium chloride  40 mEq Oral Daily  . [MAR Hold] sodium chloride flush  3 mL Intravenous Q12H  . [MAR Hold] sodium chloride flush  3 mL Intravenous Q12H  . sodium chloride flush       Continuous Infusions: . [MAR Hold] sodium chloride    . heparin 1,400 Units/hr (08/08/18 0600)   PRN Meds: [MAR Hold] sodium chloride, [MAR Hold] acetaminophen, [MAR Hold] ondansetron (ZOFRAN) IV, [MAR Hold] sodium chloride flush, [MAR Hold] sodium chloride flush, [MAR Hold] traZODone   Vital Signs    Vitals:   08/08/18 0917 08/08/18 0918 08/08/18 0930 08/08/18 0945  BP:   94/61 97/70  Pulse: 70 (!) 166 69 (!) 162  Resp: 20 15 18  (!) 23  Temp:      TempSrc:      SpO2: 95% 94% 97% 94%  Weight:      Height:        Intake/Output Summary (Last 24 hours) at 08/08/2018 0946 Last data filed at 08/08/2018 0912 Gross per 24 hour  Intake 594.51 ml  Output 1950 ml  Net -1355.49 ml   Last 3 Weights 08/07/2018 08/06/2018 08/06/2018  Weight (lbs) 288 lb 2.3 oz 284 lb 9.8 oz 290 lb  Weight (kg) 130.7 kg 129.1 kg 131.543 kg      Telemetry    Atrial fibrillation 130 bpm- Personally Reviewed  ECG     - Personally Reviewed  Physical Exam   GEN: No acute distress.   Obese  neck: No JVD Cardiac:  Rapid, irregularly irregular no murmurs, rubs, or gallops.  Respiratory: Clear to auscultation bilaterally. GI: Soft, nontender, non-distended  MS: No edema; No deformity. Neuro:  Nonfocal  Psych: Normal affect   Labs    High Sensitivity Troponin:   Recent Labs  Lab 08/06/18 1316  TROPONINIHS 18*  22*      Cardiac EnzymesNo results for input(s): TROPONINI in the last 168 hours. No results for input(s): TROPIPOC in the last 168 hours.   Chemistry Recent Labs  Lab 08/06/18 1316 08/07/18 0257 08/08/18 0155  NA 137 138 136  K 4.1 3.8 3.5  CL 105 103 102  CO2 23 27 25   GLUCOSE 138* 116* 99  BUN 11 13 12   CREATININE 1.07 1.25* 1.26*  CALCIUM 8.7* 8.4* 8.2*  PROT 7.1  --   --   ALBUMIN 4.0  --   --   AST 35  --   --   ALT 24  --   --   ALKPHOS 77  --   --   BILITOT 2.0*  --   --   GFRNONAA >60 >60 >60  GFRAA >60 >60 >60  ANIONGAP 9 8 9  Hematology Recent Labs  Lab 08/06/18 1316 08/07/18 0257 08/08/18 0155  WBC 7.0 7.6 6.3  RBC 4.93 4.35 4.43  HGB 14.5 12.8* 13.3  HCT 45.9 41.8 41.5  MCV 93.1 96.1 93.7  MCH 29.4 29.4 30.0  MCHC 31.6 30.6 32.0  RDW 14.8 15.0 14.6  PLT 164 192 175    BNP Recent Labs  Lab 08/06/18 1316  BNP 480.0*     DDimer No results for input(s): DDIMER in the last 168 hours.   Radiology    Dg Chest 2 View  Result Date: 08/06/2018 CLINICAL DATA:  Atrial fibrillation.  Chest pain. EXAM: CHEST - 2 VIEW COMPARISON:  None. FINDINGS: There is cardiomegaly with mild pulmonary vascular congestion, a small right effusion and a tiny left effusion. No consolidative infiltrates.  Bones are normal. IMPRESSION: Congestive heart failure. Electronically Signed   By: Lorriane Shire M.D.   On: 08/06/2018 14:02    Cardiac Studies   TEE Normal LV function Left atrial appendage thrombus noted Spontaneous contrast left atrium Mild MR, TR, trace PR Moderately dilated left atrium  Patient Profile     Kenneth Ferguson is a 53 y.o. male with a hx of PAF with RVR (2015) not previously on anticoagulation and s/p TEE/DCCV in 2015 and maintaining SR since that time and until undergoing severe emotional distress s/p recent loss of his wife (~06/2018), no other cardiac history or significant medical history per patient, and who is being seen today for the evaluation of Afib   Assessment & Plan    Atrial fibrillation with RVR TEE this morning with left atrial appendage thrombus Unable to perform cardioversion -We will hold carvedilol, given low blood pressure, need for better rate control Start digoxin 0.25 mg daily for rate control He has indicated he does not want metoprolol As alternative we will start bisoprolol 5 BID with slow titration upwards  Adjustment disorder Recent loss of his wife, very anxious He may benefit from SSRI Will defer to medicine service  Left atrial appendage thrombus Also with spontaneous contrast noted and moderately dilated left atrium Will need heparin bridge to warfarin Reimage with transesophageal echo after at least 4 weeks of anticoagulation Will aim for rate control with anticoagulation over the next month  Long discussion with son and patient at the bedside concerning the above, management of atrial fibrillation with RVR, anticoagulation, follow-up transesophageal echo at a later date and restoring normal sinus rhythm, going back to work limitations -Case also discussed with ICU team on rounds  Total encounter time more than 35 minutes  Greater than 50% was spent in counseling and coordination of care with the patient   For questions or updates, please contact [facility] Please consult [email or web address] for contact info under        Signed, Ida Rogue, MD  08/08/2018, 9:46 AM

## 2018-08-08 NOTE — Progress Notes (Signed)
CRITICAL CARE NOTE      CHIEF COMPLAINT:   Atrial fibrillation with rapid ventricular response   SUBJECTIVE       53 year old with A. fib RVR and history of same 5 years back.  Has been evaluated by cardiology initially placed on amiodarone and Cardizem drip with poor response in terms of rate control.  Status post trans-esophageal echo with left atrial appendage thrombus noted.  Plan for long-term anticoagulation repeat TEE with possible cardioversion after.  PAST MEDICAL HISTORY   Past Medical History:  Diagnosis Date  . A-fib [facility])      SURGICAL HISTORY   History reviewed. No pertinent surgical history.   FAMILY HISTORY   History reviewed. No pertinent family history.   SOCIAL HISTORY   Social History   Tobacco Use  . Smoking status: Never Smoker  . Smokeless tobacco: Never Used  Substance Use Topics  . Alcohol use: Never    Frequency: Never  . Drug use: Never     MEDICATIONS   Current Medication:  Current Facility-Administered Medications:  .  [MAR Hold] 0.9 %  sodium chloride infusion, 250 mL, Intravenous, PRN, Pyreddy, Pavan, MD .  Doug Sou Hold] acetaminophen (TYLENOL) tablet 650 mg, 650 mg, Oral, Q4H PRN, Pyreddy, Pavan, MD, 650 mg at 08/07/18 1447 .  [MAR Hold] aspirin EC tablet 81 mg, 81 mg, Oral, Daily, Pyreddy, Pavan, MD, 81 mg at 08/07/18 0847 .  butamben-tetracaine-benzocaine (CETACAINE) 03-11-12 % spray, , , ,  .  [MAR Hold] carvedilol (COREG) tablet 6.25 mg, 6.25 mg, Oral, BID WC, Visser, Jacquelyn D, PA-C, 6.25 mg at 08/07/18 1748 .  [MAR Hold] furosemide (LASIX) injection 60 mg, 60 mg, Intravenous, BID, Pyreddy, Pavan, MD, 60 mg at 08/07/18 1748 .  heparin ADULT infusion 100 units/mL (25000 units/250mL sodium chloride 0.45%), 1,400 Units/hr, Intravenous, Continuous,  Charlett Nose, RPH, Last Rate: 14 mL/hr at 08/08/18 0600, 1,400 Units/hr at 08/08/18 0600 .  lidocaine (XYLOCAINE) 2 % viscous mouth solution, , , ,  .  [MAR Hold] ondansetron (ZOFRAN) injection 4 mg, 4 mg, Intravenous, Q6H PRN, Pyreddy, Pavan, MD, 4 mg at 08/07/18 0738 .  [MAR Hold] potassium chloride SA (K-DUR) CR tablet 40 mEq, 40 mEq, Oral, Daily, Charlett Nose, RPH .  [MAR Hold] sodium chloride flush (NS) 0.9 % injection 3 mL, 3 mL, Intravenous, Q12H, Pyreddy, Pavan, MD, 3 mL at 08/07/18 2200 .  [MAR Hold] sodium chloride flush (NS) 0.9 % injection 3 mL, 3 mL, Intravenous, PRN, Pyreddy, Pavan, MD .  Mikaela.Ping Hold] sodium chloride flush (NS) 0.9 % injection 3 mL, 3 mL, Intravenous, PRN, Mickle Plumb, Jacquelyn D, PA-C .  [MAR Hold] sodium chloride flush (NS) 0.9 % injection 3 mL, 3 mL, Intravenous, Q12H, Visser, Jacquelyn D, PA-C, 3 mL at 08/08/18 0030 .  sodium chloride flush 0.9 % injection, , , ,  .  [MAR Hold] traZODone (DESYREL) tablet 50 mg, 50 mg, Oral, QHS PRN, Awilda Bill, NP, 50 mg at 08/07/18 2150    ALLERGIES   Patient has no known allergies.    REVIEW OF SYSTEMS     10 point review of systems is negative except for fullness in the abdomen  PHYSICAL EXAMINATION   Vitals:   08/08/18 0800 08/08/18 0830  BP: 109/60 113/73  Pulse: 61 (!) 140  Resp: 16 19  Temp:  97.7 F (36.5 C)  SpO2: 93% 95%    GENERAL: Emotional crying age-appropriate morbid obese male HEAD: Normocephalic, atraumatic.  EYES: Pupils equal,  round, reactive to light.  No scleral icterus.  MOUTH: Moist mucosal membrane. NECK: Supple. No thyromegaly. No nodules. No JVD.  PULMONARY: Mild crackles at the bases bilaterally CARDIOVASCULAR: S1 and S2. Regular rate and rhythm. No murmurs, rubs, or gallops.  GASTROINTESTINAL: Soft, nontender, non-distended. No masses. Positive bowel sounds. No hepatosplenomegaly.  MUSCULOSKELETAL: No swelling, clubbing, or edema.  NEUROLOGIC: Mild distress due to  acute illness SKIN:intact,warm,dry   LABS AND IMAGING   LAB RESULTS: Recent Labs  Lab 08/06/18 1316 08/07/18 0257 08/08/18 0155  NA 137 138 136  K 4.1 3.8 3.5  CL 105 103 102  CO2 23 27 25   BUN 11 13 12   CREATININE 1.07 1.25* 1.26*  GLUCOSE 138* 116* 99   Recent Labs  Lab 08/06/18 1316 08/07/18 0257 08/08/18 0155  HGB 14.5 12.8* 13.3  HCT 45.9 41.8 41.5  WBC 7.0 7.6 6.3  PLT 164 192 175     IMAGING RESULTS: No results found.    ASSESSMENT AND PLAN    -Multidisciplinary rounds held today     Atrial fibrillation with rapid ventricular response- -currently on Cardizem gtt. as well as amiodarone -Cardiology consultation placed-appreciate input-plan for cardioversion -oxygen as needed, lovenox for theraputic AC -Lasix 60 mg IV administered -follow up cardiac enzymes as indicated ICU monitoring -Left atrial appendage thrombus for long-term anticoagulation.  Will optimize for downgrade to medical floor with telemetry    Acute decompensated systolic CHF with EF 40 to 45% and pericardial effusion -Status post transthoracic echo yesterday showing systolic dysfunction -Clinically patient with JVD and 3+ pitting lower extremity edema -BNP significantly elevated -Status post transthoracic echo report pending -Possible takatsubo due to working overtime and loss of wife after repeated hospitalizations and surgeries  -continue Foley Catheter-assess need daily   GI/Nutrition GI PROPHYLAXIS as indicated DIET-->TF's as tolerated Constipation protocol as indicated  ENDO - ICU hypoglycemic\Hyperglycemia protocol -check FSBS per protocol   ELECTROLYTES -follow labs as needed -replace as needed -pharmacy consultation   DVT/GI PRX ordered -SCDs  TRANSFUSIONS AS NEEDED MONITOR FSBS ASSESS the need for LABS as needed   Critical care provider statement:    Critical care time (minutes):  32   Critical care time was exclusive of:  Separately billable  procedures and treating other patients   Critical care was necessary to treat or prevent imminent or life-threatening deterioration of the following conditions:   Atrial fibrillation, morbid obesity, probable new onset CHF, multiple comorbid conditions   Critical care was time spent personally by me on the following activities:  Development of treatment plan with patient or surrogate, discussions with consultants, evaluation of patient's response to treatment, examination of patient, obtaining history from patient or surrogate, ordering and performing treatments and interventions, ordering and review of laboratory studies and re-evaluation of patient's condition.  I assumed direction of critical care for this patient from another provider in my specialty: no    This document was prepared using Dragon voice recognition software and may include unintentional dictation errors.    Vida Rigger, M.D.  Division of Pulmonary & Critical Care Medicine  [facility]

## 2018-08-09 ENCOUNTER — Encounter: Payer: Self-pay | Admitting: Cardiovascular Disease

## 2018-08-09 LAB — CBC
HCT: 42.3 % (ref 39.0–52.0)
Hemoglobin: 13.4 g/dL (ref 13.0–17.0)
MCH: 29.5 pg (ref 26.0–34.0)
MCHC: 31.7 g/dL (ref 30.0–36.0)
MCV: 93 fL (ref 80.0–100.0)
Platelets: 174 10*3/uL (ref 150–400)
RBC: 4.55 MIL/uL (ref 4.22–5.81)
RDW: 14.6 % (ref 11.5–15.5)
WBC: 5.5 10*3/uL (ref 4.0–10.5)
nRBC: 0 % (ref 0.0–0.2)

## 2018-08-09 LAB — PROTIME-INR
INR: 1.2 (ref 0.8–1.2)
Prothrombin Time: 15.3 seconds — ABNORMAL HIGH (ref 11.4–15.2)

## 2018-08-09 LAB — BASIC METABOLIC PANEL
Anion gap: 10 (ref 5–15)
BUN: 14 mg/dL (ref 6–20)
CO2: 26 mmol/L (ref 22–32)
Calcium: 8.2 mg/dL — ABNORMAL LOW (ref 8.9–10.3)
Chloride: 103 mmol/L (ref 98–111)
Creatinine, Ser: 1.23 mg/dL (ref 0.61–1.24)
GFR calc Af Amer: >60 mL/min (ref >60)
GFR calc non Af Amer: >60 mL/min (ref >60)
Glucose, Bld: 85 mg/dL (ref 70–99)
Potassium: 3.8 mmol/L (ref 3.5–5.1)
Sodium: 139 mmol/L (ref 135–145)

## 2018-08-09 LAB — HEPARIN LEVEL (UNFRACTIONATED): Heparin Unfractionated: 0.5 IU/mL (ref 0.30–0.70)

## 2018-08-09 LAB — MAGNESIUM: Magnesium: 2.1 mg/dL (ref 1.7–2.4)

## 2018-08-09 MED ORDER — WARFARIN SODIUM 5 MG PO TABS
5.0000 mg | ORAL_TABLET | Freq: Once | ORAL | Status: AC
  Administered 2018-08-09: 5 mg via ORAL
  Filled 2018-08-09: qty 1

## 2018-08-09 MED ORDER — ENOXAPARIN SODIUM 150 MG/ML ~~LOC~~ SOLN
1.0000 mg/kg | Freq: Two times a day (BID) | SUBCUTANEOUS | Status: DC
  Administered 2018-08-09 – 2018-08-10 (×3): 130 mg via SUBCUTANEOUS
  Filled 2018-08-09 (×3): qty 0.86

## 2018-08-09 MED ORDER — POTASSIUM CHLORIDE CRYS ER 20 MEQ PO TBCR
40.0000 meq | EXTENDED_RELEASE_TABLET | Freq: Two times a day (BID) | ORAL | Status: DC
  Administered 2018-08-09 – 2018-08-10 (×2): 40 meq via ORAL
  Filled 2018-08-09 (×2): qty 2

## 2018-08-09 NOTE — Progress Notes (Signed)
Progress Note  Patient Name: Kenneth Ferguson Date of Encounter: 08/09/2018  Subjective   No complaint of chest pain, palpitations, or racing HR despite continued elevated rates.  S/p TEE with LA thrombus.  Inpatient Medications    Scheduled Meds: . aspirin EC  81 mg Oral Daily  . bisoprolol  5 mg Oral BID  . digoxin  0.25 mg Oral Daily  . enoxaparin (LOVENOX) injection  1 mg/kg Subcutaneous BID  . furosemide  60 mg Intravenous BID  . potassium chloride  40 mEq Oral Daily  . sodium chloride flush  3 mL Intravenous Q12H  . sodium chloride flush  3 mL Intravenous Q12H  . warfarin  5 mg Oral ONCE-1800  . Warfarin - Pharmacist Dosing Inpatient   Does not apply q1800   Continuous Infusions: . sodium chloride     PRN Meds: sodium chloride, acetaminophen, ondansetron (ZOFRAN) IV, sodium chloride flush, sodium chloride flush, traZODone   Vital Signs    Vitals:   08/09/18 0500 08/09/18 0506 08/09/18 0600 08/09/18 0815  BP:  (!) 120/98  100/73  Pulse: 96 76 100 89  Resp: (!) 22 12 16  (!) 22  Temp:    98.4 F (36.9 C)  TempSrc:    Oral  SpO2:  95% 93% 93%  Weight: 128.9 kg     Height:        Intake/Output Summary (Last 24 hours) at 08/09/2018 1114 Last data filed at 08/09/2018 0800 Gross per 24 hour  Intake 456.53 ml  Output 2200 ml  Net -1743.47 ml   Filed Weights   08/06/18 1729 08/07/18 0500 08/09/18 0500  Weight: 129.1 kg 130.7 kg 128.9 kg    Telemetry    IRIR rates up into the 160s at times while in room - Personally Reviewed  ECG    No new tracings - Personally Reviewed  Physical Exam   GEN: No acute distress.   Neck: JVD difficult to assess d/t body habitus Cardiac: IRIR, no murmurs, rubs, or gallops.  Respiratory: Clear to auscultation bilaterally. GI: Obese. Firm, somewhat distended MS: Bilateral 1-2+ pitting edema; No deformity. Neuro:  Nonfocal  Psych: Normal affect   Labs    Chemistry Recent Labs  Lab 08/06/18 1316 08/07/18 0257 08/08/18  0155 08/09/18 0447  NA 137 138 136 139  K 4.1 3.8 3.5 3.8  CL 105 103 102 103  CO2 23 27 25 26   GLUCOSE 138* 116* 99 85  BUN 11 13 12 14   CREATININE 1.07 1.25* 1.26* 1.23  CALCIUM 8.7* 8.4* 8.2* 8.2*  PROT 7.1  --   --   --   ALBUMIN 4.0  --   --   --   AST 35  --   --   --   ALT 24  --   --   --   ALKPHOS 77  --   --   --   BILITOT 2.0*  --   --   --   GFRNONAA >60 >60 >60 >60  GFRAA >60 >60 >60 >60  ANIONGAP 9 8 9 10      Hematology Recent Labs  Lab 08/07/18 0257 08/08/18 0155 08/09/18 0447  WBC 7.6 6.3 5.5  RBC 4.35 4.43 4.55  HGB 12.8* 13.3 13.4  HCT 41.8 41.5 42.3  MCV 96.1 93.7 93.0  MCH 29.4 30.0 29.5  MCHC 30.6 32.0 31.7  RDW 15.0 14.6 14.6  PLT 192 175 174    Cardiac EnzymesNo results for input(s): TROPONINI in the last  168 hours. No results for input(s): TROPIPOC in the last 168 hours.   BNP Recent Labs  Lab 08/06/18 1316  BNP 480.0*     DDimer No results for input(s): DDIMER in the last 168 hours.   Radiology    No results found.  Cardiac Studies   TEE 08/08/2018  1. Evidence of a thrombus present in the left atrial appendage. Spontaneous contrast noted in the left atrium. Cardioversion was cancelled.  2. The left ventricle has mild-moderately reduced systolic function, with an ejection fraction of 40-45%. There is moderately increased left ventricular wall thickness. Left ventricular diastolic Doppler parameters are consistent with indeterminate  diastolic dysfunction.  3. The right ventricle has mildly reduced systolic function. There is no increase in right ventricular wall thickness. Right ventricular systolic pressure is mildly elevated.  4. Left atrial size was moderately dilated.  5. Right atrial size was moderately dilated.  6. Mild aortic atherosclerosis noted.  Echo 08/07/2018  1. Severe hypokinesis of the left ventricular, entire anteroseptal wall, apical segment and anterior wall.  2. The left ventricle has mild-moderately  reduced systolic function, with an ejection fraction of 40-45%. The cavity size was normal. There is moderately increased left ventricular wall thickness. Left ventricular diastolic function could not be  evaluated secondary to atrial fibrillation.  3. The right ventricle has moderately reduced systolic function. The cavity was not assessed. There is mildly increased right ventricular wall thickness. Right ventricular systolic pressure could not be assessed.  4. Left atrial size was moderately dilated.  5. Moderate pericardial effusion.  6. The pericardial effusion is posterior and lateral to the left ventricle.  7. The mitral valve is degenerative. Mild thickening of the mitral valve leaflet.  8. The tricuspid valve is grossly normal.  9. The aortic valve is tricuspid. Mild calcification of the aortic valve. 10. The aortic root is normal in size and structure. 11. The inferior vena cava was dilated in size with <50% respiratory variability.  Patient Profile     53 y.o. male  PAF with RVR (2015) not previously on anticoagulation and s/p TEE/DCCV in 2015 and maintaining SR since that time and until undergoing severe emotional distress s/p recent loss of his wife 06/29/18), left atrial thrombus (identified 08/08/2018), and no other cardiac history or significant medical history per patient, and who is being seen today for the evaluation of Afib with RVR (2015, not on anticoagulation PTA) and newly diagnosed acute systolic heart failure management.  Assessment & Plan    Paroxysmal Atrial fibrillation with rapid ventricular response, NSVT - No further complaint of palpitations and SOB since admission. Previously diagnosed with Afib in 2015 and suspected that was maintaining SR until death of wife 06/29/2018. - CHA2DS2VASc score of at least 1 (HFrEF, vascular) with recommendation for anticoagulation and given current atrial thrombus. A1C within range and not consistent with DM2.  --Continue anticoagulation  with warfarin. Will need to set up for INR checks with coumadin clinic. Will need scheduled for repeat TEE in at ~4 weeks to reassess LA appendage thrombus.  --Cannot cardiovert patient in the setting of LA thrombus. No plan for cardioversion this admission. - Ventricular rates have remained uncontrolled this admission and with no plan to cardiovert as above. Continue medical therapy for rate control, which has been limited by blood pressure with current BP soft and rates still elevated.  - Currently on bisoprolol and digoxin. Recommend escalate BB as BP / renal function allows for optimal rate control. Coreg was held for  low BP. Patient does not wish to be on metoprolol as indicated in original consult note (low ventricular rate on pulse ox in Afib).   HFrEF - SOB/DOE, orthopnea, LEE, and abdominal distention with 10 lb weight gain in last month. BNP at admission 480.0 and not significantly elevated but in the setting of obesity.  --Suspect etiology likely tachycardia induced in the setting of Afib with RVR s/p grief of losing wife and as above in HPI. - Still volume overloaded - abdominal distention and pitting LEE. Continue IV lasix at  and titrate as needed for adequate urine function as renal function allows. Continue to monitor I/O, daily standing weights. Daily BMET to monitor renal functions and electrolytes with diuresis. Will escalate evidence based HF therapy once BP allows. --Will need scheduled for follow-up in clinic given Afib, HFrEF and at discharge.  Hypokalemia - K 3.8. Repleting with goal 4.0. Recheck tomorrow AM. - Patient will likely need ongoing potassium supplementation on diuresis.   AKI versus CKD - Cr 1.23 with BUN 14 and baseline renal function unknown. Continue to monitor.   Anemia - Hgb 13.4. Baseline unknown. Continue to monitor.  High sensitivity troponin elevation - No chest pain reported currently or leading up to admission. High sensitivity troponin  elevation with peak of 22 (consistent with troponin of 0.02), down-trending to 18. EKG without acute changes. Troponin elevation likely supply demand ischemia in the setting of volume overload and rapid ventricular rate. Patient reported prior h/o cardiac catheterization when first diagnosed with Afib (~5 years ago) and faxed consent under media tab for attempt to obtain records. No plan for ischemic workup with cardiac catheterization during this admission at this time.    For questions or updates, please contact [facility] Please consult [email or web address] for contact info under        Signed, Lennon Alstrom, PA-C  08/09/2018, 11:14 AM

## 2018-08-09 NOTE — Progress Notes (Addendum)
[place] for Warfarin  Indication: atrial fibrillation; left atrial appendage thrombus   No Known Allergies  Patient Measurements: Height: 5\' 8"  (172.7 cm) Weight: 284 lb 2.8 oz (128.9 kg) IBW/kg (Calculated) : 68.4 Heparin Dosing Weight: 99.06  Vital Signs: Temp: 98.4 F (36.9 C) (07/02 0815) Temp Source: Oral (07/02 0815) BP: 102/71 (07/02 1300) Pulse Rate: 87 (07/02 1300)  Labs: Recent Labs    08/06/18 1747  08/07/18 0257 08/07/18 2000 08/08/18 0155 08/09/18 0447  HGB  --    < > 12.8*  --  13.3 13.4  HCT  --   --  41.8  --  41.5 42.3  PLT  --   --  192  --  175 174  LABPROT 15.6*  --   --   --   --  15.3*  INR 1.3*  --   --   --   --  1.2  HEPARINUNFRC <0.10*  --   --  0.53 0.53 0.50  CREATININE  --   --  1.25*  --  1.26* 1.23   < > = values in this interval not displayed.    Estimated Creatinine Clearance: 89.9 mL/min (by C-G formula based on SCr of 1.23 mg/dL).   Medical History: Past Medical History:  Diagnosis Date  . A-fib (HCC)     Medications:  No medications prior to admission.    Assessment: Pharmacy consulted for warfarin management for 53 yo male with atrial fibrillation and left atrial appendage thrombus. Patient initially ordered heparin drip converted to enoxaparin as part of discharge planning. Patient initiated on warfarin on 7/1.     Goal of Therapy:  INR 2-3 Heparin level 0.3-0.7 units/ml Monitor platelets by anticoagulation protocol: Yes   Plan:  Continue warfarin 5mg  daily. Will obtain INR with am labs. Will plan to bridge for a minimum of 5 days and two consecutive INRs in range. Discussed plan with patient and answered questions about the necessity of bridging.   Pharmacy will continue to monitor and adjust per consult.   Currie Paris , RPh  08/09/2018,4:08 PM

## 2018-08-09 NOTE — Progress Notes (Signed)
Pharmacy Electrolyte Monitoring Consult:  Pharmacy consulted to assist in monitoring and replacing electrolytes in this 53 y.o. male admitted on 08/06/2018 with Atrial Fibrillation   Labs:  Sodium (mmol/L)  Date Value  08/09/2018 139   Potassium (mmol/L)  Date Value  08/09/2018 3.8   Magnesium (mg/dL)  Date Value  08/09/2018 2.1   Calcium (mg/dL)  Date Value  08/09/2018 8.2 (L)   Albumin (g/dL)  Date Value  08/06/2018 4.0    Assessment/Plan: Patient ordered furosemide 60mg  IV BID. Potassium 66mEq PO BID x 2 doses.   Will obtain electrolytes with am labs.   Goal potassium ~ 4 and goal magnesium ~ 2.   Pharmacy will continue to monitor and adjust per consult.   Naoma Boxell L 08/09/2018 4:03 PM

## 2018-08-09 NOTE — Progress Notes (Signed)
[facility] for Heparin/Warfarin  Indication: atrial fibrillation; left atrial appendage thrombus   No Known Allergies  Patient Measurements: Height: 5\' 8"  (172.7 cm) Weight: 284 lb 2.8 oz (128.9 kg) IBW/kg (Calculated) : 68.4 Heparin Dosing Weight: 99.06  Vital Signs: Temp: 98.2 F (36.8 C) (07/01 2005) Temp Source: Oral (07/01 2005) BP: 120/98 (07/02 0506) Pulse Rate: 76 (07/02 0506)  Labs: Recent Labs    08/06/18 1316  08/06/18 1747 08/07/18 0257 08/07/18 2000 08/08/18 0155 08/09/18 0447  HGB 14.5  --   --  12.8*  --  13.3 13.4  HCT 45.9  --   --  41.8  --  41.5 42.3  PLT 164  --   --  192  --  175 174  LABPROT  --   --  15.6*  --   --   --  15.3*  INR  --   --  1.3*  --   --   --  1.2  HEPARINUNFRC  --    < > <0.10*  --  0.53 0.53 0.50  CREATININE 1.07  --   --  1.25*  --  1.26* 1.23  TROPONINIHS 18*  22*  --   --   --   --   --   --    < > = values in this interval not displayed.    Estimated Creatinine Clearance: 89.9 mL/min (by C-G formula based on SCr of 1.23 mg/dL).   Medical History: Past Medical History:  Diagnosis Date  . A-fib (HCC)     Medications:  No medications prior to admission.    Assessment: RE is 74 YOM admitted on 08/06/18 for Afib. Pharmacy was consulted initially for Enoxaparin dosing which was never administered. Pharmacy has now been requested for a consult on heparin dosing. A 2D echocardiogram was done on 6/30 with results pending. PMH significant for TEE/DCCV in 2015 and maintaining SR since that time and until undergoing severe emotional distress s/p recent loss of his wife (~06/2018), no other cardiac history or significant medical history per patient, and who is being seen today for the evaluation of Afib and possible CHF at the request of Dr. Estanislado Pandy  -Using Heparin dosing weight to dose as TBW is >125% IBW.  Goal of Therapy:  INR 2-3 Heparin level 0.3-0.7 units/ml Monitor platelets by  anticoagulation protocol: Yes   Plan:  07/02 @ 0500 HL 0.50 therapeutic. Will continue rate of 1400 units/hr and will recheck anti-Xa w/ am labs.  Warfarin: will initiate warfarin 5mg  daily. Will obtain INR with am labs. Will plan to bridge for a minimum of 5 days and two consecutive INRs in range.   Pharmacy will continue to monitor and adjust per consult.   Tobie Lords, PharmD, BCPS Clinical Pharmacist 08/09/2018,5:41 AM

## 2018-08-09 NOTE — Progress Notes (Deleted)
CRRT cartridge clotted off blood returned to pt HD cath ports packed with heparin.Started back on CRRT as per order.tolerating well .Marland KitchenMarland Kitchen

## 2018-08-09 NOTE — Progress Notes (Signed)
CRITICAL CARE NOTE      CHIEF COMPLAINT:   Atrial fibrillation with rapid ventricular response   SUBJECTIVE       53 year old with A. fib RVR and history of same 5 years back.  Has been evaluated by cardiology initially placed on amiodarone and Cardizem drip with poor response in terms of rate control.  Status post trans-esophageal echo with left atrial appendage thrombus noted.  Plan for long-term anticoagulation repeat TEE with possible cardioversion after.  PAST MEDICAL HISTORY   Past Medical History:  Diagnosis Date  . A-fib [facility])      SURGICAL HISTORY   History reviewed. No pertinent surgical history.   FAMILY HISTORY   History reviewed. No pertinent family history.   SOCIAL HISTORY   Social History   Tobacco Use  . Smoking status: Never Smoker  . Smokeless tobacco: Never Used  Substance Use Topics  . Alcohol use: Never    Frequency: Never  . Drug use: Never     MEDICATIONS   Current Medication:  Current Facility-Administered Medications:  .  0.9 %  sodium chloride infusion, 250 mL, Intravenous, PRN, Pyreddy, Pavan, MD .  acetaminophen (TYLENOL) tablet 650 mg, 650 mg, Oral, Q4H PRN, Pyreddy, Pavan, MD, 650 mg at 08/07/18 1447 .  aspirin EC tablet 81 mg, 81 mg, Oral, Daily, Pyreddy, Pavan, MD, 81 mg at 08/07/18 0847 .  bisoprolol (ZEBETA) tablet 5 mg, 5 mg, Oral, BID, Gollan, Kathlene November, MD, 5 mg at 08/08/18 2141 .  digoxin (LANOXIN) tablet 0.25 mg, 0.25 mg, Oral, Daily, Gollan, Kathlene November, MD .  furosemide (LASIX) injection 60 mg, 60 mg, Intravenous, BID, Pyreddy, Pavan, MD, 60 mg at 08/08/18 1753 .  heparin ADULT infusion 100 units/mL (25000 units/24mL sodium chloride 0.45%), 1,400 Units/hr, Intravenous, Continuous, Charlett Nose, RPH, Last Rate: 14 mL/hr at 08/09/18 0600,  1,400 Units/hr at 08/09/18 0600 .  ondansetron (ZOFRAN) injection 4 mg, 4 mg, Intravenous, Q6H PRN, Pyreddy, Pavan, MD, 4 mg at 08/07/18 0738 .  potassium chloride SA (K-DUR) CR tablet 40 mEq, 40 mEq, Oral, Daily, Charlett Nose, RPH .  sodium chloride flush (NS) 0.9 % injection 3 mL, 3 mL, Intravenous, Q12H, Pyreddy, Pavan, MD, 3 mL at 08/08/18 2145 .  sodium chloride flush (NS) 0.9 % injection 3 mL, 3 mL, Intravenous, PRN, Pyreddy, Pavan, MD .  sodium chloride flush (NS) 0.9 % injection 3 mL, 3 mL, Intravenous, PRN, Mickle Plumb, Jacquelyn D, PA-C .  sodium chloride flush (NS) 0.9 % injection 3 mL, 3 mL, Intravenous, Q12H, Visser, Jacquelyn D, PA-C, 3 mL at 08/08/18 2145 .  traZODone (DESYREL) tablet 50 mg, 50 mg, Oral, QHS PRN, Awilda Bill, NP, 50 mg at 08/08/18 2144 .  Warfarin - Pharmacist Dosing Inpatient, , Does not apply, q1800, Charlett Nose, [facility]    ALLERGIES   Patient has no known allergies.    REVIEW OF SYSTEMS     10 point review of systems is negative except for fullness in the abdomen  PHYSICAL EXAMINATION   Vitals:   08/09/18 0506 08/09/18 0600  BP: (!) 120/98   Pulse: 76 100  Resp: 12 16  Temp:    SpO2: 95% 93%    GENERAL: Emotional crying age-appropriate morbid obese male HEAD: Normocephalic, atraumatic.  EYES: Pupils equal, round, reactive to light.  No scleral icterus.  MOUTH: Moist mucosal membrane. NECK: Supple. No thyromegaly. No nodules. No JVD.  PULMONARY: Mild crackles at the bases bilaterally CARDIOVASCULAR: S1 and S2.  Regular rate and rhythm. No murmurs, rubs, or gallops.  GASTROINTESTINAL: Soft, nontender, non-distended. No masses. Positive bowel sounds. No hepatosplenomegaly.  MUSCULOSKELETAL: No swelling, clubbing, or edema.  NEUROLOGIC: Mild distress due to acute illness SKIN:intact,warm,dry   LABS AND IMAGING   LAB RESULTS: Recent Labs  Lab 08/07/18 0257 08/08/18 0155 08/09/18 0447  NA 138 136 139  K 3.8 3.5 3.8  CL 103  102 103  CO2 27 25 26   BUN 13 12 14   CREATININE 1.25* 1.26* 1.23  GLUCOSE 116* 99 85   Recent Labs  Lab 08/07/18 0257 08/08/18 0155 08/09/18 0447  HGB 12.8* 13.3 13.4  HCT 41.8 41.5 42.3  WBC 7.6 6.3 5.5  PLT 192 175 174     IMAGING RESULTS: No results found.    ASSESSMENT AND PLAN    -Multidisciplinary rounds held today     Atrial fibrillation with rapid ventricular response- -currently on Cardizem gtt. as well as amiodarone -Cardiology consultation placed-appreciate input-plan for cardioversion -oxygen as needed, lovenox for theraputic AC -Lasix 60 mg IV administered -follow up cardiac enzymes as indicated ICU monitoring -Left atrial appendage thrombus for long-term anticoagulation.  Will optimize for downgrade to medical floor with telemetry    Acute decompensated systolic CHF with EF 40 to 45% and pericardial effusion -Status post transthoracic echo yesterday showing systolic dysfunction -Clinically patient with JVD and 3+ pitting lower extremity edema -BNP significantly elevated -Status post transthoracic echo report pending -Possible takatsubo due to working overtime and loss of wife after repeated hospitalizations and surgeries  -continue Foley Catheter-assess need daily   GI/Nutrition GI PROPHYLAXIS as indicated DIET-->TF's as tolerated Constipation protocol as indicated  ENDO - ICU hypoglycemic\Hyperglycemia protocol -check FSBS per protocol   ELECTROLYTES -follow labs as needed -replace as needed -pharmacy consultation   DVT/GI PRX ordered -SCDs  TRANSFUSIONS AS NEEDED MONITOR FSBS ASSESS the need for LABS as needed   Critical care provider statement:    Critical care time (minutes):  31   Critical care time was exclusive of:  Separately billable procedures and treating other patients   Critical care was necessary to treat or prevent imminent or life-threatening deterioration of the following conditions:   Atrial fibrillation,  morbid obesity, probable new onset CHF, multiple comorbid conditions   Critical care was time spent personally by me on the following activities:  Development of treatment plan with patient or surrogate, discussions with consultants, evaluation of patient's response to treatment, examination of patient, obtaining history from patient or surrogate, ordering and performing treatments and interventions, ordering and review of laboratory studies and re-evaluation of patient's condition.  I assumed direction of critical care for this patient from another provider in my specialty: no    This document was prepared using Dragon voice recognition software and may include unintentional dictation errors.    Vida Rigger, M.D.  Division of Pulmonary & Critical Care Medicine  [facility]

## 2018-08-09 NOTE — Progress Notes (Signed)
[place] at [place] NAME: Kenneth Ferguson    MR#:  [id number]  DATE OF BIRTH:  03/06/1965  SUBJECTIVE:  CHIEF COMPLAINT:   Chief Complaint  Patient presents with  . Atrial Fibrillation    Continues to have A. fib with RVR  Feels much better with the shortness of breath and lower extremity edema.  REVIEW OF SYSTEMS:    Review of Systems  Constitutional: Positive for malaise/fatigue. Negative for chills and fever.  HENT: Negative for sore throat.   Eyes: Negative for blurred vision, double vision and pain.  Respiratory: Negative for cough, hemoptysis, shortness of breath and wheezing.   Cardiovascular: Negative for chest pain, palpitations, orthopnea and leg swelling.  Gastrointestinal: Negative for abdominal pain, constipation, diarrhea, heartburn, nausea and vomiting.  Genitourinary: Negative for dysuria and hematuria.  Musculoskeletal: Negative for back pain and joint pain.  Skin: Negative for rash.  Neurological: Negative for sensory change, speech change, focal weakness and headaches.  Endo/Heme/Allergies: Does not bruise/bleed easily.  Psychiatric/Behavioral: Negative for depression. The patient is not nervous/anxious.    DRUG ALLERGIES:  No Known Allergies  VITALS:  Blood pressure 100/73, pulse 89, temperature 98.4 F (36.9 C), temperature source Oral, resp. rate (!) 22, height 5\' 8"  (1.727 m), weight 128.9 kg, SpO2 93 %.  PHYSICAL EXAMINATION:   Physical Exam  GENERAL:  53 y.o.-year-old patient lying in the bed .  Obese EYES: Pupils equal, round, reactive to light and accommodation. No scleral icterus. Extraocular muscles intact.  HEENT: Head atraumatic, normocephalic. Oropharynx and nasopharynx clear.  NECK:  Supple, no jugular venous distention. No thyroid enlargement, no tenderness.  LUNGS: Normal breath sounds bilaterally, no wheezing, rales, rhonchi. No use of accessory muscles of respiration.  CARDIOVASCULAR:  Irregularly irregular and tachycardic ABDOMEN: Soft, nontender, nondistended. Bowel sounds present. No organomegaly or mass.  EXTREMITIES: No cyanosis, clubbing or edema b/l.    NEUROLOGIC: Cranial nerves II through XII are intact. No focal Motor or sensory deficits b/l.   PSYCHIATRIC: The patient is alert and oriented x 3.  SKIN: No obvious rash, lesion, or ulcer.   LABORATORY PANEL:   CBC Recent Labs  Lab 08/09/18 0447  WBC 5.5  HGB 13.4  HCT 42.3  PLT 174   ------------------------------------------------------------------------------------------------------------------ Chemistries  Recent Labs  Lab 08/06/18 1316  08/09/18 0447  NA 137   < > 139  K 4.1   < > 3.8  CL 105   < > 103  CO2 23   < > 26  GLUCOSE 138*   < > 85  BUN 11   < > 14  CREATININE 1.07   < > 1.23  CALCIUM 8.7*   < > 8.2*  MG  --    < > 2.1  AST 35  --   --   ALT 24  --   --   ALKPHOS 77  --   --   BILITOT 2.0*  --   --    < > = values in this interval not displayed.   ------------------------------------------------------------------------------------------------------------------  Cardiac Enzymes No results for input(s): TROPONINI in the last 168 hours. ------------------------------------------------------------------------------------------------------------------  RADIOLOGY:  No results found.   ASSESSMENT AND PLAN:   *Atrial fibrillation with rapid ventricular rate.   Was on amiodarone drip.  Stopped after TEE showed left atrial appendage thrombus Discussed with Dr. Rockey Situ of cardiology. On digoxin and bisoprolol 5 mg twice daily  *Left atrial appendage thrombus  Coumadin.  Heparin drip changed  to subcutaneous therapeutic Lovenox Plan is to discharge home on Coumadin with Lovenox bridging  *Acute on chronic systolic CHF. On IV Lasix and diuresing On beta-blockers.   Unable to start ACE inhibitor due to low blood pressure  *Nonsustained ventricular tachycardia No further issues  since admission  All the records are reviewed and case discussed with Care Management/Social Worker Management plans discussed with the patient, family and they are in agreement.  CODE STATUS: FULL CODE  TOTAL TIME TAKING CARE OF THIS PATIENT: 35 minutes.   POSSIBLE D/C IN 1-2 DAYS, DEPENDING ON CLINICAL CONDITION.  Molinda Bailiff Chavon Lucarelli M.D on 08/09/2018 at 1:05 PM  Between 7am to 6pm - Pager - [phone]  After 6pm go to [email or web address] - password [email or web address]  [facility] [place] [facility]  Office  [phone]  CC: Primary care physician; Patient, [name]  Note: This dictation was prepared with Dragon dictation along with smaller phrase technology. Any transcriptional errors that result from this process are unintentional.

## 2018-08-10 ENCOUNTER — Other Ambulatory Visit: Payer: Self-pay

## 2018-08-10 LAB — CBC
HCT: 42.1 % (ref 39.0–52.0)
Hemoglobin: 13.4 g/dL (ref 13.0–17.0)
MCH: 30.1 pg (ref 26.0–34.0)
MCHC: 31.8 g/dL (ref 30.0–36.0)
MCV: 94.6 fL (ref 80.0–100.0)
Platelets: 178 10*3/uL (ref 150–400)
RBC: 4.45 MIL/uL (ref 4.22–5.81)
RDW: 14.5 % (ref 11.5–15.5)
WBC: 5.3 10*3/uL (ref 4.0–10.5)
nRBC: 0 % (ref 0.0–0.2)

## 2018-08-10 LAB — BASIC METABOLIC PANEL
Anion gap: 10 (ref 5–15)
BUN: 14 mg/dL (ref 6–20)
CO2: 27 mmol/L (ref 22–32)
Calcium: 8.5 mg/dL — ABNORMAL LOW (ref 8.9–10.3)
Chloride: 101 mmol/L (ref 98–111)
Creatinine, Ser: 1.2 mg/dL (ref 0.61–1.24)
GFR calc Af Amer: >60 mL/min (ref >60)
GFR calc non Af Amer: >60 mL/min (ref >60)
Glucose, Bld: 88 mg/dL (ref 70–99)
Potassium: 4.1 mmol/L (ref 3.5–5.1)
Sodium: 138 mmol/L (ref 135–145)

## 2018-08-10 LAB — MAGNESIUM: Magnesium: 2.2 mg/dL (ref 1.7–2.4)

## 2018-08-10 LAB — PROTIME-INR
INR: 1.4 — ABNORMAL HIGH (ref 0.8–1.2)
Prothrombin Time: 17.2 seconds — ABNORMAL HIGH (ref 11.4–15.2)

## 2018-08-10 MED ORDER — DIGOXIN 250 MCG PO TABS: 0.2500 mg | ORAL_TABLET | Freq: Every day | ORAL | 0 refills | Status: DC

## 2018-08-10 MED ORDER — WARFARIN SODIUM 5 MG PO TABS: 5.0000 mg | ORAL_TABLET | Freq: Once | ORAL | Status: DC

## 2018-08-10 MED ORDER — APIXABAN 5 MG PO TABS: 5.0000 mg | ORAL_TABLET | Freq: Two times a day (BID) | ORAL | 0 refills | Status: DC

## 2018-08-10 MED ORDER — POTASSIUM CHLORIDE CRYS ER 20 MEQ PO TBCR: 20.0000 meq | EXTENDED_RELEASE_TABLET | Freq: Every day | ORAL | 0 refills | Status: DC

## 2018-08-10 MED ORDER — BISOPROLOL FUMARATE 5 MG PO TABS: 5.0000 mg | ORAL_TABLET | Freq: Two times a day (BID) | ORAL | 0 refills | Status: DC

## 2018-08-10 MED ORDER — APIXABAN 5 MG PO TABS: 5.0000 mg | ORAL_TABLET | Freq: Two times a day (BID) | ORAL | Status: DC

## 2018-08-10 MED ORDER — FUROSEMIDE 40 MG PO TABS: 40.0000 mg | ORAL_TABLET | Freq: Every day | ORAL | 0 refills | Status: DC

## 2018-08-10 NOTE — Discharge Summary (Signed)
[place] at [place] NAME: Kenneth Ferguson    MR#:  [id number]  DATE OF BIRTH:  [birth year]  DATE OF ADMISSION:  08/06/2018 ADMITTING PHYSICIAN: Saundra Shelling, MD  DATE OF DISCHARGE: 08/10/2018  2:01 PM  PRIMARY CARE PHYSICIAN: Patient, [name]   ADMISSION DIAGNOSIS:  A-fib ([place]) [I48.91]  DISCHARGE DIAGNOSIS:  Active Problems:   Atrial fibrillation with rapid ventricular response (H[facility])   Acute systolic heart failure ([place])   SECONDARY DIAGNOSIS:   Past Medical History:  Diagnosis Date  . A-fib [facility])      ADMITTING HISTORY  HISTORY OF PRESENT ILLNESS: Kenneth Ferguson  is a 52 y.o. 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  He felt some palpitations.  Patient also noticed some swelling in the lower extremities and has been retaining fluid.  No history of any heart failure.  Patient was evaluated emergency room he was found to be in A. fib with rapid rate.  He also had runs of V. Tach.  She was started on IV amiodarone drip.  Chest x-ray revealed vascular congestion and heart failure changes.  No complaints of any chest pain.  Hospitalist service was consulted for further care.  HOSPITAL COURSE:   *Atrial fibrillation with rapid ventricular rate.   Was on amiodarone drip.  Stopped after TEE showed left atrial appendage thrombus Discussed with Dr. Rockey Situ of cardiology. On digoxin and bisoprolol 5 mg twice daily Rate is improved.  He does get tachycardic on ambulation patient is wanting to leave and I discussed with Dr. Fletcher Anon who advised that patient is okay to be discharged home and follow-up with him in the office.  *Left atrial appendage thrombus On heparin drip initially and changed to Eliquis at time of discharge.  *Acute on chronic systolic CHF. On IV Lasix and diuresed well On beta-blockers.   Unable to start ACE inhibitor due to low blood  pressure  *Nonsustained ventricular tachycardia No further issues since admission  Patient discharged home in stable condition.  Advised to return if any worsening of symptoms.  CONSULTS OBTAINED:  Treatment Team:  Wellington Hampshire, MD Ottie Glazier, MD [facility], [facility], MD  DRUG ALLERGIES:  No Known Allergies  DISCHARGE MEDICATIONS:   Allergies as of 08/10/2018   No Known Allergies     Medication List    TAKE these medications   apixaban 5 MG Tabs tablet Commonly known as: ELIQUIS Take 1 tablet (5 mg total) by mouth 2 (two) times daily.   bisoprolol 5 MG tablet Commonly known as: ZEBETA Take 1 tablet (5 mg total) by mouth 2 (two) times daily.   digoxin 0.25 MG tablet Commonly known as: LANOXIN Take 1 tablet (0.25 mg total) by mouth daily. Start taking on: August 11, 2018   furosemide 40 MG tablet Commonly known as: Lasix Take 1 tablet (40 mg total) by mouth daily.   potassium chloride SA 20 MEQ tablet Commonly known as: K-DUR Take 1 tablet (20 mEq total) by mouth daily.       Today   VITAL SIGNS:  Blood pressure 114/80, pulse 85, temperature 98.1 F (36.7 C), temperature source Oral, resp. rate 18, height 5\' 8"  (1.727 m), weight 128.9 kg, SpO2 95 %.  I/O:    Intake/Output Summary (Last 24 hours) at 08/10/2018 1437 Last data filed at 08/10/2018 1300 Gross per 24 hour  Intake 720 ml  Output  3975 ml  Net -3255 ml    PHYSICAL EXAMINATION:  Physical Exam  GENERAL:  53 y.o.-year-old patient lying in the bed with no acute distress.  LUNGS: Normal breath sounds bilaterally, no wheezing, rales,rhonchi or crepitation. No use of accessory muscles of respiration.  CARDIOVASCULAR: S1, S2 normal. No murmurs, rubs, or gallops.  ABDOMEN: Soft, non-tender, non-distended. Bowel sounds present. No organomegaly or mass.  NEUROLOGIC: Moves all 4 extremities. PSYCHIATRIC: The patient is alert and oriented x 3.  SKIN: No obvious rash, lesion, or ulcer.   DATA  REVIEW:   CBC Recent Labs  Lab 08/10/18 0515  WBC 5.3  HGB 13.4  HCT 42.1  PLT 178    Chemistries  Recent Labs  Lab 08/06/18 1316  08/10/18 0515  NA 137   < > 138  K 4.1   < > 4.1  CL 105   < > 101  CO2 23   < > 27  GLUCOSE 138*   < > 88  BUN 11   < > 14  CREATININE 1.07   < > 1.20  CALCIUM 8.7*   < > 8.5*  MG  --    < > 2.2  AST 35  --   --   ALT 24  --   --   ALKPHOS 77  --   --   BILITOT 2.0*  --   --    < > = values in this interval not displayed.    Cardiac Enzymes No results for input(s): TROPONINI in the last 168 hours.  Microbiology Results  Results for orders placed or performed during the hospital encounter of 08/06/18  SARS Coronavirus 2 (CEPHEID - Performed in [facility] lab), Hosp Order     Status: None   Collection Time: 08/06/18  2:38 PM   Specimen: Nasopharyngeal Swab  Result Value Ref Range Status   SARS Coronavirus 2 NEGATIVE NEGATIVE Final    Comment: (NOTE) If result is NEGATIVE SARS-CoV-2 target nucleic acids are NOT DETECTED. The SARS-CoV-2 RNA is generally detectable in upper and lower  respiratory specimens during the acute phase of infection. The lowest  concentration of SARS-CoV-2 viral copies this assay can detect is 250  copies / mL. A negative result does not preclude SARS-CoV-2 infection  and should not be used as the sole basis for treatment or other  patient management decisions.  A negative result may occur with  improper specimen collection / handling, submission of specimen other  than nasopharyngeal swab, presence of viral mutation(s) within the  areas targeted by this assay, and inadequate number of viral copies  (<250 copies / mL). A negative result must be combined with clinical  observations, patient history, and epidemiological information. If result is POSITIVE SARS-CoV-2 target nucleic acids are DETECTED. The SARS-CoV-2 RNA is generally detectable in upper and lower  respiratory specimens dur ing the  acute phase of infection.  Positive  results are indicative of active infection with SARS-CoV-2.  Clinical  correlation with patient history and other diagnostic information is  necessary to determine patient infection status.  Positive results do  not rule out bacterial infection or co-infection with other viruses. If result is PRESUMPTIVE POSTIVE SARS-CoV-2 nucleic acids MAY BE PRESENT.   A presumptive positive result was obtained on the submitted specimen  and confirmed on repeat testing.  While 2019 novel coronavirus  (SARS-CoV-2) nucleic acids may be present in the submitted sample  additional confirmatory testing may be necessary for epidemiological  and /  or clinical management purposes  to differentiate between  SARS-CoV-2 and other Sarbecovirus currently known to infect humans.  If clinically indicated additional testing with an alternate test  methodology [phone]) is advised. The SARS-CoV-2 RNA is generally  detectable in upper and lower respiratory sp ecimens during the acute  phase of infection. The expected result is Negative. Fact Sheet for Patients:  BoilerBrush.com.cy Fact Sheet for Healthcare Providers: https://pope.com/ This test is not yet approved or cleared by the [place] FDA and has been authorized for detection and/or diagnosis of SARS-CoV-2 by FDA under an Emergency Use Authorization (EUA).  This EUA will remain in effect (meaning this test can be used) for the duration of the COVID-19 declaration under Section 564(b)(1) of the Act, 21 U.S.C. section 360bbb-3(b)(1), unless the authorization is terminated or revoked sooner. Performed at [facility], [address] [place], [place] [address]   MRSA PCR Screening     Status: None   Collection Time: 08/06/18  5:37 PM   Specimen: Nasopharyngeal  Result Value Ref Range Status   MRSA by PCR NEGATIVE NEGATIVE Final    Comment:        The GeneXpert  MRSA Assay (FDA approved for NASAL specimens only), is one component of a comprehensive MRSA colonization surveillance program. It is not intended to diagnose MRSA infection nor to guide or monitor treatment for MRSA infections. Performed at [facility], [address], [place], [place] [address]     RADIOLOGY:  No results found.  Follow up with PCP in 1 week.  Management plans discussed with the patient, family and they are in agreement.  CODE STATUS:     Code Status Orders  (From admission, onward)         Start     Ordered   08/06/18 1750  Full code  Continuous     08/06/18 1749        Code Status History    This patient has a current code status but no historical code status.   Advance Care Planning Activity      TOTAL TIME TAKING CARE OF THIS PATIENT ON DAY OF DISCHARGE: more than 30 minutes.   Kenneth Ferguson M.D on 08/10/2018 at 2:37 PM  Between 7am to 6pm - Pager - [phone]  After 6pm go to [email or web address] - password [email or web address]  [facility] [place] [facility]  Office  [phone]  CC: Primary care physician; Patient, [name]  Note: This dictation was prepared with Dragon dictation along with smaller phrase technology. Any transcriptional errors that result from this process are unintentional.

## 2018-08-10 NOTE — Discharge Instructions (Signed)
Heart healthy diet   - Daily fluids < 2 liters. - Low salt diet - Check weight everyday and keep log. Take to your doctors appt. - Take extra dose of lasix if you gain more than 3 pounds weight.

## 2018-08-10 NOTE — Progress Notes (Signed)
CRITICAL CARE NOTE      CHIEF COMPLAINT:   Atrial fibrillation with rapid ventricular response   SUBJECTIVE       53 year old with A. fib RVR and history of same 5 years back.  Has been evaluated by cardiology initially placed on amiodarone and Cardizem drip with poor response in terms of rate control.  Status post trans-esophageal echo with left atrial appendage thrombus noted.  Plan for long-term anticoagulation repeat TEE with possible cardioversion after.  Plan for d/c today   PAST MEDICAL HISTORY   Past Medical History:  Diagnosis Date  . A-fib [facility])      SURGICAL HISTORY   Past Surgical History:  Procedure Laterality Date  . CARDIOVERSION N/A 08/08/2018   Procedure: CARDIOVERSION;  Surgeon: Minna Merritts, MD;  Location: [facility] ORS;  Service: Cardiovascular;  Laterality: N/A;  . TEE WITHOUT CARDIOVERSION N/A 08/08/2018   Procedure: TRANSESOPHAGEAL ECHOCARDIOGRAM (TEE);  Surgeon: Minna Merritts, MD;  Location: [facility] ORS;  Service: Cardiovascular;  Laterality: N/A;     FAMILY HISTORY   History reviewed. No pertinent family history.   SOCIAL HISTORY   Social History   Tobacco Use  . Smoking status: Never Smoker  . Smokeless tobacco: Never Used  Substance Use Topics  . Alcohol use: Never    Frequency: Never  . Drug use: Never     MEDICATIONS   Current Medication:  Current Facility-Administered Medications:  .  0.9 %  sodium chloride infusion, 250 mL, Intravenous, PRN, Pyreddy, Pavan, MD .  acetaminophen (TYLENOL) tablet 650 mg, 650 mg, Oral, Q4H PRN, Pyreddy, Pavan, MD, 650 mg at 08/07/18 1447 .  aspirin EC tablet 81 mg, 81 mg, Oral, Daily, Pyreddy, Pavan, MD, 81 mg at 08/09/18 1202 .  bisoprolol (ZEBETA) tablet 5 mg, 5 mg, Oral, BID, Gollan, Kathlene November, MD, 5 mg at 08/09/18 2146  .  digoxin (LANOXIN) tablet 0.25 mg, 0.25 mg, Oral, Daily, Gollan, Kathlene November, MD, 0.25 mg at 08/09/18 1202 .  enoxaparin (LOVENOX) injection 130 mg, 1 mg/kg, Subcutaneous, BID, Gollan, Kathlene November, MD, 130 mg at 08/09/18 2146 .  furosemide (LASIX) injection 60 mg, 60 mg, Intravenous, BID, Pyreddy, Pavan, MD, 60 mg at 08/09/18 1720 .  ondansetron (ZOFRAN) injection 4 mg, 4 mg, Intravenous, Q6H PRN, Pyreddy, Pavan, MD, 4 mg at 08/07/18 0738 .  potassium chloride SA (K-DUR) CR tablet 40 mEq, 40 mEq, Oral, BID, Charlett Nose, RPH, 40 mEq at 08/09/18 2146 .  sodium chloride flush (NS) 0.9 % injection 3 mL, 3 mL, Intravenous, Q12H, Pyreddy, Pavan, MD, 3 mL at 08/09/18 2156 .  sodium chloride flush (NS) 0.9 % injection 3 mL, 3 mL, Intravenous, PRN, Pyreddy, Pavan, MD .  sodium chloride flush (NS) 0.9 % injection 3 mL, 3 mL, Intravenous, PRN, Mickle Plumb, Jacquelyn D, PA-C .  sodium chloride flush (NS) 0.9 % injection 3 mL, 3 mL, Intravenous, Q12H, Visser, Jacquelyn D, PA-C, 3 mL at 08/09/18 2156 .  traZODone (DESYREL) tablet 50 mg, 50 mg, Oral, QHS PRN, Awilda Bill, NP, 50 mg at 08/09/18 2149 .  Warfarin - Pharmacist Dosing Inpatient, , Does not apply, q1800, Charlett Nose, [facility]    ALLERGIES   Patient has no known allergies.    REVIEW OF SYSTEMS     10 point review of systems is negative except for fullness in the abdomen  PHYSICAL EXAMINATION   Vitals:   08/10/18 0600 08/10/18 0730  BP: 104/69   Pulse: 74 (!) 109  Resp: 17 (!) 22  Temp:  98.1 F (36.7 C)  SpO2: 90% 94%    GENERAL: Emotional crying age-appropriate morbid obese male HEAD: Normocephalic, atraumatic.  EYES: Pupils equal, round, reactive to light.  No scleral icterus.  MOUTH: Moist mucosal membrane. NECK: Supple. No thyromegaly. No nodules. No JVD.  PULMONARY: Mild crackles at the bases bilaterally CARDIOVASCULAR: S1 and S2. Regular rate and rhythm. No murmurs, rubs, or gallops.  GASTROINTESTINAL: Soft,  nontender, non-distended. No masses. Positive bowel sounds. No hepatosplenomegaly.  MUSCULOSKELETAL: No swelling, clubbing, or edema.  NEUROLOGIC: Mild distress due to acute illness SKIN:intact,warm,dry   LABS AND IMAGING   LAB RESULTS: Recent Labs  Lab 08/08/18 0155 08/09/18 0447 08/10/18 0515  NA 136 139 138  K 3.5 3.8 4.1  CL 102 103 101  CO2 25 26 27   BUN 12 14 14   CREATININE 1.26* 1.23 1.20  GLUCOSE 99 85 88   Recent Labs  Lab 08/08/18 0155 08/09/18 0447 08/10/18 0515  HGB 13.3 13.4 13.4  HCT 41.5 42.3 42.1  WBC 6.3 5.5 5.3  PLT 175 174 178     IMAGING RESULTS: No results found.    ASSESSMENT AND PLAN    -Multidisciplinary rounds held today     Atrial fibrillation with rapid ventricular response- -currently on Cardizem gtt. as well as amiodarone -Cardiology consultation placed-appreciate input-plan for cardioversion -oxygen as needed, lovenox for theraputic AC -Lasix 60 mg IV administered -follow up cardiac enzymes as indicated ICU monitoring -Left atrial appendage thrombus for long-term anticoagulation.  Will optimize for downgrade to medical floor with telemetry    Acute decompensated systolic CHF with EF 40 to 45% and pericardial effusion -Status post transthoracic echo yesterday showing systolic dysfunction -Clinically patient with JVD and 3+ pitting lower extremity edema -BNP significantly elevated -Status post transthoracic echo report pending -Possible takatsubo due to working overtime and loss of wife after repeated hospitalizations and surgeries  -continue Foley Catheter-assess need daily   GI/Nutrition GI PROPHYLAXIS as indicated DIET-->TF's as tolerated Constipation protocol as indicated  ENDO - ICU hypoglycemic\Hyperglycemia protocol -check FSBS per protocol   ELECTROLYTES -follow labs as needed -replace as needed -pharmacy consultation   DVT/GI PRX ordered -SCDs  TRANSFUSIONS AS NEEDED MONITOR FSBS ASSESS the need  for LABS as needed   Critical care provider statement:    Critical care time (minutes):  31   Critical care time was exclusive of:  Separately billable procedures and treating other patients   Critical care was necessary to treat or prevent imminent or life-threatening deterioration of the following conditions:   Atrial fibrillation, morbid obesity, probable new onset CHF, multiple comorbid conditions   Critical care was time spent personally by me on the following activities:  Development of treatment plan with patient or surrogate, discussions with consultants, evaluation of patient's response to treatment, examination of patient, obtaining history from patient or surrogate, ordering and performing treatments and interventions, ordering and review of laboratory studies and re-evaluation of patient's condition.  I assumed direction of critical care for this patient from another provider in my specialty: no    This document was prepared using Dragon voice recognition software and may include unintentional dictation errors.    Vida Rigger, M.D.  Division of Pulmonary & Critical Care Medicine  [facility]

## 2018-08-10 NOTE — Progress Notes (Signed)
Progress Note  Patient Name: Kenneth Ferguson Date of Encounter: 08/10/2018  Primary Cardiologist: Nelva Bush, MD   Subjective   He continues to be in atrial fibrillation with rapid ventricular response.  In spite of that, he reports significant improvement in symptoms and that he is back to his baseline with no significant palpitations or chest pain.  He has mild exertional dyspnea.  He wants to go home today.  Inpatient Medications    Scheduled Meds: . apixaban  5 mg Oral BID  . aspirin EC  81 mg Oral Daily  . bisoprolol  5 mg Oral BID  . digoxin  0.25 mg Oral Daily  . furosemide  60 mg Intravenous BID  . potassium chloride  40 mEq Oral BID  . sodium chloride flush  3 mL Intravenous Q12H  . sodium chloride flush  3 mL Intravenous Q12H   Continuous Infusions: . sodium chloride     PRN Meds: sodium chloride, acetaminophen, ondansetron (ZOFRAN) IV, sodium chloride flush, sodium chloride flush, traZODone   Vital Signs    Vitals:   08/10/18 0500 08/10/18 0600 08/10/18 0730 08/10/18 0845  BP: 92/74 104/69  114/80  Pulse: 68 74 (!) 109 85  Resp: 15 17 (!) 22 18  Temp: 97.8 F (36.6 C)  98.1 F (36.7 C)   TempSrc: Axillary  Oral   SpO2: 91% 90% 94% 95%  Weight:      Height:        Intake/Output Summary (Last 24 hours) at 08/10/2018 0957 Last data filed at 08/10/2018 0900 Gross per 24 hour  Intake 1203 ml  Output 2750 ml  Net -1547 ml   Last 3 Weights 08/09/2018 08/07/2018 08/06/2018  Weight (lbs) 284 lb 2.8 oz 288 lb 2.3 oz 284 lb 9.8 oz  Weight (kg) 128.9 kg 130.7 kg 129.1 kg      Telemetry    Atrial fibrillation with rapid ventricular response.  Heart rate is ranging from 82 130 bpm.- Personally Reviewed  ECG    Not done today.- Personally Reviewed  Physical Exam   GEN: No acute distress.   Neck: No JVD Cardiac:  Irregularly irregular and tachycardic, no murmurs, rubs, or gallops.  Respiratory:  Diminished breath sounds at the right base likely small  pleural effusion. GI: Soft, nontender, non-distended  MS:  Mild bilateral leg edema.; No deformity  Neuro:  Nonfocal  Psych: Normal affect   Labs    High Sensitivity Troponin:   Recent Labs  Lab 08/06/18 1316  TROPONINIHS 18*  22*      Cardiac EnzymesNo results for input(s): TROPONINI in the last 168 hours. No results for input(s): TROPIPOC in the last 168 hours.   Chemistry Recent Labs  Lab 08/06/18 1316  08/08/18 0155 08/09/18 0447 08/10/18 0515  NA 137   < > 136 139 138  K 4.1   < > 3.5 3.8 4.1  CL 105   < > 102 103 101  CO2 23   < > 25 26 27   GLUCOSE 138*   < > 99 85 88  BUN 11   < > 12 14 14   CREATININE 1.07   < > 1.26* 1.23 1.20  CALCIUM 8.7*   < > 8.2* 8.2* 8.5*  PROT 7.1  --   --   --   --   ALBUMIN 4.0  --   --   --   --   AST 35  --   --   --   --  ALT 24  --   --   --   --   ALKPHOS 77  --   --   --   --   BILITOT 2.0*  --   --   --   --   GFRNONAA >60   < > >60 >60 >60  GFRAA >60   < > >60 >60 >60  ANIONGAP 9   < > 9 10 10    < > = values in this interval not displayed.     Hematology Recent Labs  Lab 08/08/18 0155 08/09/18 0447 08/10/18 0515  WBC 6.3 5.5 5.3  RBC 4.43 4.55 4.45  HGB 13.3 13.4 13.4  HCT 41.5 42.3 42.1  MCV 93.7 93.0 94.6  MCH 30.0 29.5 30.1  MCHC 32.0 31.7 31.8  RDW 14.6 14.6 14.5  PLT 175 174 178    BNP Recent Labs  Lab 08/06/18 1316  BNP 480.0*     DDimer No results for input(s): DDIMER in the last 168 hours.   Radiology    No results found.  Cardiac Studies   08/08/2018:   1. Evidence of a thrombus present in the left atrial appendage. Spontaneous contrast noted in the left atrium. Cardioversion was cancelled.  2. The left ventricle has mild-moderately reduced systolic function, with an ejection fraction of 40-45%. There is moderately increased left ventricular wall thickness. Left ventricular diastolic Doppler parameters are consistent with indeterminate  diastolic dysfunction.  3. The right ventricle  has mildly reduced systolic function. There is no increase in right ventricular wall thickness. Right ventricular systolic pressure is mildly elevated.  4. Left atrial size was moderately dilated.  5. Right atrial size was moderately dilated.  6. Mild aortic atherosclerosis noted.  Patient Profile     53 y.o. male with Hx of PAFwith RVR(2015) not previously on anticoagulation and s/p TEE/DCCV in 2015 and maintaining SR since that time and until undergoing severe emotional distress s/p recent loss of his wife (~06/2018), left atrial thrombus (identified 08/08/2018), and no other cardiac historyor significant medical history per patient, Belinda Block is being seen today for the evaluation of Afib with RVR (2015, not on anticoagulation PTA) andnewly diagnosed acute systolic heart failure management.  Assessment & Plan    1.  Paroxysmal atrial fibrillation with rapid ventricular response: Ventricular rate is still not optimally controlled and not able to uptitrate beta-blocker due to relatively low blood pressure.  Unfortunately, TEE showed evidence of left atrial appendage thrombus and thus cardioversion could not be done. Recommend continuing current dose of bisoprolol and digoxin. I discussed options of anticoagulation and the patient does not want injections.  I am going to switch him to Eliquis 5 mg twice daily. The plan is to anticoagulate him for 3 to 4 weeks followed by repeat TEE guided cardioversion to ensure resolution of left atrial appendage thrombus. The patient wants to go home today because he feels back to his baseline.  I explained to him that his rate is still not optimally controlled.  2.  Acute systolic heart failure: Possibly he has prior history of cardiomyopathy and reports having cardiac catheterization in the past which showed no obstructive coronary artery disease.  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 Volume overload improved significantly and will going to  switch him to furosemide 40 mg by mouth once daily with potassium chloride 20 mEq once daily.  3.  Mildly elevated troponin likely demand ischemia in the setting of atrial fibrillation with RVR and heart failure.  No  anginal symptoms.  The patient reports having cardiac catheterization done in the past which showed no obstructive coronary artery disease.  Had a prolonged discussion with the patient about importance of rate control and taking medications.  We will arrange for follow-up in our office in 1 week.  I discussed the case with Dr. Elpidio AnisSudini and with pharmacy.        For questions or updates, please contact [facility] Please consult [email or web address] for contact info under        Signed, Lorine BearsMuhammad Arida, MD  08/10/2018, 9:57 AM

## 2018-08-10 NOTE — Progress Notes (Signed)
Patient discharged. Discharge teaching reviewed with patient and patient's son. Questions answered. Patient walked two laps around ICU earlier in shift. Patient received eliquis coupon from [place], [employer] officer, museum.

## 2018-08-13 ENCOUNTER — Telehealth: Payer: Self-pay | Admitting: *Deleted

## 2018-08-13 NOTE — Telephone Encounter (Signed)
lmov to schedule  °

## 2018-08-13 NOTE — Telephone Encounter (Signed)
-----   Message from Wellington Hampshire, MD sent at 08/10/2018 10:22 AM EDT ----- The patient will be discharged from [facility] today.  Schedule TCM follow-up in 1 to 2 weeks with Dr. Saunders Revel or APP.

## 2018-08-14 ENCOUNTER — Telehealth: Payer: Self-pay | Admitting: Internal Medicine

## 2018-08-14 NOTE — Telephone Encounter (Signed)
TCM....  Patient is being discharged   They saw Elenor Quinones  They are scheduled to see R. Dunn on 07/14  They were seen for afib  They need to be seen within 1-2 weeks  Pt not added to wait list   Please call

## 2018-08-14 NOTE — Telephone Encounter (Signed)
Patient is scheduled and TCM completed in additional telephone encounter from today's date. Closing this encounter.

## 2018-08-14 NOTE — Telephone Encounter (Signed)
Patient contacted regarding discharge from [facility] on 08/10/18.  Patient understands to follow up with provider Ryan on 08/21/18 at 2:30 pm at [place]. Patient understands discharge instructions? yes Patient understands medications and regiment? yes Patient understands to bring all medications to this visit? yes

## 2018-08-19 NOTE — Progress Notes (Signed)
Cardiology Office Note Date:  08/21/2018  Patient ID:  Kenneth Ferguson, DOB 09/12/1965, MRN [id number] PCP:  Patient, [name]  Cardiologist:  Dr. Saunders Revel, MD    Chief Complaint: Hospital follow up  History of Present Illness: Kenneth Ferguson is a 53 y.o. male with history of PAF with RVR status post TEE guided cardioversion in 2015 with reported recurrence of A. fib in 2018 details are unclear and more recently recurrence of A. fib in 2020 as outlined below, HFrEF, left atrial appendage thrombus, questionable history of HOCM, morbid obesity, and acute stress reaction who presents for hospital follow-up after recent admission to [facility] from 6/29 through 7/3 for recurrent A. fib with RVR.  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  He reportedly was found to have a murmur on a racing physical and was referred to cardiology at that time.  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  Following this he did well up until 2015 when he developed new onset palpitations and was diagnosed with A. fib with RVR.  He underwent successful TEE guided cardioversion at outside hospital.  Following this procedure the patient reports he underwent diagnostic cardiac cath and was told he had normal coronaries and "the heart of a 53 year old."  He also states the hospital would not let him leave and last he was wearing a LifeVest.  Details surrounding this are unavailable.  It appears the patient did wear the LifeVest for a brief amount of time though ultimately self discontinued this.  He denies any shocks from the LifeVest.  Patient has previously been followed by outside cardiology group though appears was lost to follow-up.  Patient has self-reported a history of hypertrophic cardiomyopathy.  He was apparently diagnosed with A. fib in 2015 and underwent TEE guided cardioversion.  Patient reports a  prior cardiac catheterization around that time with details unclear.  He was apparently later admitted to the hospital in 2018 with recurrent A. fib with note from [facility] dated 07/31/2018 indicating the patient was found to have hokum on echocardiogram.  In this setting, he was discharged wearing a LifeVest though self discontinued this.  Details of all of these diagnoses are uncertain.  Following that he was apparently lost to follow-up and did not complete cardiac MRI or outpatient cardiac monitoring.  More recently, he suffered the unexpected loss of his wife in 06/2018.  Following this acute stressor the patient feels like he went into A. fib.  Several weeks later he noted increased shortness of breath/DOE.  He was treated at an outside urgent care for bronchitis and prescribed a Z-Pak without improvement in symptoms.  He subsequently developed bilateral lower extremity swelling and abdominal distention.  He subsequently presented to outside hospital where he was noted to be in A. fib with RVR and placed on metoprolol for rate control.  Notes indicate the patient self discontinued this medication as his heart rate on a pulse ox was noted to be in the 20s bpm per his report.  He was seen with a telephone visit on 07/31/2018 by Dr. Christella Scheuermann with [facility] with recommendation to follow through with the prior recommended work-up including echo, outpatient cardiac monitoring, and cardiac MRI.  Patient did not follow through with this.  He was ultimately admitted to [facility] on 6/29 with A. fib with RVR and acute systolic CHF.  Transthoracic  echo on 08/07/2018 showed severe hypokinesis of the left ventricle, entire anteroseptal wall, apical segment, and anterior wall, EF 40 to 45%, moderately increased LV wall thickness, moderately reduced RV systolic function, mild increased RV wall thickness, moderately dilated left atrium, moderate pericardial effusion posterior and lateral to the left ventricle, degenerative  mitral valve with thickening, tricuspid aortic valve with mild calcification, aortic root normal in size and structure.  Plans were for a TEE guided cardioversion however the TEE on 7/1 showed a left atrial appendage thrombus leading to cancellation of the cardioversion.  He was gently diuresed with improvement in shortness of breath.  His ventricular rates remained difficult to control during his admission though the patient was insistent on being discharged.  His high-sensitivity troponin was mildly elevated with an initial value of 22 with a delta of 18 and felt to be in the setting of supply demand ischemia secondary to A. fib with RVR and acute systolic CHF which was felt to be tachycardia mediated.  Discharge weight 128.9 kg.  Discharge medications: Medication List    TAKE these medications   apixaban 5 MG Tabs tablet Commonly known as: ELIQUIS Take 1 tablet (5 mg total) by mouth 2 (two) times daily.   bisoprolol 5 MG tablet Commonly known as: ZEBETA Take 1 tablet (5 mg total) by mouth 2 (two) times daily.   digoxin 0.25 MG tablet Commonly known as: LANOXIN Take 1 tablet (0.25 mg total) by mouth daily. Start taking on: August 11, 2018   furosemide 40 MG tablet Commonly known as: Lasix Take 1 tablet (40 mg total) by mouth daily.   potassium chloride SA 20 MEQ tablet Commonly known as: K-DUR Take 1 tablet (20 mEq total) by mouth daily.    Labs: 08/2018- WBC 5.3, Hgb 13.4, PLT 178, magnesium 2.2, potassium 4.1, serum creatinine 1.20, TSH normal, A1c 4.8, BNP 480, COVID-19 negative, albumin 4.0, AST/ALT normal  Patient comes in today feeling great.  He states he has not felt this well in several years.  He reports full compliance with all medications including anticoagulation without missing any doses.  He has improved his diet and is no longer eating fast food as frequently.  He reports his weight at home has remained stable between 250 and 260 pounds.  Indicates this is his "dry  weight."  He would like to lose further weight down to approximately 215 pounds moving forward.  He denies any chest pain, shortness of breath, palpitations, dizziness, presyncope, or syncope.  No lower extremity swelling, abdominal distention, orthopnea, PND, early satiety.  No falls.  No BRBPR or melena.  He continues to grieve the loss of his wife though has started counseling through his insurance and feels this is helping.    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  bisoprolol (ZEBETA) 5 MG tablet Take 1 tablet (5 mg total) by mouth 2 (two) times daily.   digoxin (LANOXIN) 0.25 MG tablet Take 1 tablet (0.25 mg total) by mouth daily.   furosemide (LASIX) 40 MG tablet Take 1 tablet (40 mg total) by mouth daily.   potassium chloride SA (K-DUR) 20 MEQ tablet Take 1 tablet (20 mEq total) by mouth daily.    Allergies:   Patient has no known allergies.   Social History:  The patient  reports that he has never smoked. He has never used smokeless tobacco. He reports that he does not drink alcohol or use drugs.   Family History:  The patient's family history includes Cancer in his father; Emphysema in his mother.  ROS:   Review of Systems  Constitutional: Positive for malaise/fatigue.  Negative for chills, diaphoresis, fever and weight loss.  HENT: Negative for congestion.   Eyes: Negative for discharge and redness.  Respiratory: Negative for cough, hemoptysis, sputum production, shortness of breath and wheezing.   Cardiovascular: Negative for chest pain, palpitations, orthopnea, claudication, leg swelling and PND.  Gastrointestinal: Negative for abdominal pain, blood in stool, heartburn, melena, nausea and vomiting.  Genitourinary: Negative for hematuria.  Musculoskeletal: Negative for falls and myalgias.  Skin: Negative for rash.  Neurological: Negative for dizziness, tingling, tremors, sensory change, speech change, focal weakness, loss of consciousness and weakness.  Endo/Heme/Allergies: Does not bruise/bleed easily.  Psychiatric/Behavioral: Negative for depression, memory loss, substance abuse and suicidal ideas. The patient is not nervous/anxious and does not have insomnia.   All other systems reviewed and are negative.    PHYSICAL EXAM:  VS:  BP 110/70 (BP Location: Left Arm, Patient Position: Sitting, Cuff Size: Normal)    Pulse (!) 116    Temp 98.1 F (36.7 C)    Ht  (1.727 m)    Wt 260 lb 4 oz (118 kg)    SpO2 97%    BMI 39.57 kg/m  BMI: Body mass index is 39.57 kg/m.  Physical Exam  Constitutional: He is oriented to person, place, and time. He appears well-developed and well-nourished.  HENT:  Head: Normocephalic and atraumatic.  Eyes: Right eye exhibits no discharge. Left eye exhibits no discharge.  Neck: Normal range of motion. No JVD present.  JVD difficult to assess secondary to body habitus  Cardiovascular: Normal rate, S1 normal, S2 normal and normal heart sounds. An irregularly irregular rhythm present. Exam reveals no distant heart sounds, no friction rub, no midsystolic click and no opening snap.  No murmur heard. Pulses:      Posterior tibial pulses are 2+ on the right side and 2+ on the left side.  Heart rate during exam in the low to mid  90s bpm  Pulmonary/Chest: Effort normal and breath sounds normal. No respiratory distress. He has no decreased breath sounds. He has no wheezes. He has no rales. He exhibits no tenderness.  Abdominal: Soft. He exhibits no distension. There is no abdominal tenderness.  Obese  Musculoskeletal:        General: No edema.  Neurological: He is alert and oriented to person, place, and time.  Skin: Skin is warm and dry. No cyanosis. Nails show no clubbing.  Psychiatric: He has a normal mood and affect. His speech is normal and behavior is normal. Judgment and thought content normal.     EKG:  Was ordered and interpreted by me today. Shows Afib with RVR, 116 bpm, occasional PVCs, poor R wave progression along the  precordial leads, nonspecific st/t changes  Recent Labs: 08/06/2018: ALT 24; B Natriuretic Peptide 480.0 08/07/2018: TSH 2.126 08/10/2018: BUN 14; Creatinine, Ser 1.20; Hemoglobin 13.4; Magnesium 2.2; Platelets 178; Potassium 4.1; Sodium 138  No results found for requested labs within last 8760 hours.   Estimated Creatinine Clearance: 87.8 mL/min (by C-G formula based on SCr of 1.2 mg/dL).   Wt Readings from Last 3 Encounters:  08/21/18 260 lb 4 oz (118 kg)  08/09/18 284 lb 2.8 oz (128.9 kg)     Other studies reviewed: Additional studies/records reviewed today include: summarized above  ASSESSMENT AND PLAN:  1. Persistent A. Fib: He remains in A. fib with RVR with initial ventricular rates in the 1 teens during triage subsequently improved to the low to mid 90s beats per minute on examination.  He is asymptomatic.  Unfortunately, his relative hypotension precludes further escalation of bisoprolol or addition of calcium channel blocker.  Ideally, given his cardiomyopathy we would prefer to avoid calcium channel blocker usage at this time.  Given he is asymptomatic we will continue current therapy including bisoprolol 5 mg twice daily and digoxin 0.25 mg daily along with Eliquis 5 mg twice  daily.  He is tolerating anticoagulation without issue.  Check CBC, BMP, and digoxin level today.  Recent TSH normal.  Plan for short-term follow-up in early 09/2018 to reassess symptoms and rate/rhythm.  If he remains in A. fib at that time, and if he has been compliant with Eliquis without interruption or missed dose, plan to pursue repeat TEE guided cardioversion to ensure resolution of previously noted left atrial appendage thrombus and restore sinus rhythm.  Following this, we will plan for short-term limited echo to reevaluate EF as outlined below.  He will need a sleep study as outlined below as well.  2. 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  For now, continue current dose of Lasix though with weight loss we may need to de-escalate his diuretic therapy pending labs drawn today.  Continue current dose of bisoprolol as outlined above.  Not currently on ACE inhibitor/ARB/spironolactone secondary to relative hypotension and need for blood pressure room for rate control as outlined above.  We will plan to repeat a limited echo in approximately 1 month after sinus rhythm has been restored to reevaluate his EF.  Following restoration of sinus rhythm we should continue to escalate evidence-based heart failure therapy with addition of ARB followed by spironolactone as blood pressure and labs allow.  CHF education discussed in detail.  3. Left atrial appendage thrombus: Noted on TEE from 08/08/2018.  Remains on Eliquis as above.  Will plan for repeat TEE prior to undergoing cardioversion after he has been adequately anticoagulated without interruption for minimum of 4 weeks to ensure resolution of thrombus.  4. Elevated troponin: Patient reports a prior catheter in 2015 demonstrating nonobstructive disease however we do not have these results for review.  His elevated troponin was felt to be in the setting of tachycardic  heart rates and volume overload.  Delta troponin not significant as outlined above.  Following restoration of sinus rhythm with short-term follow-up echocardiogram to reevaluate EF we will need to plan for outpatient ischemic evaluation.  Given the patient's body habitus and questionable history of HOCM it would likely be best to perform diagnostic cath in this setting.  On Eliquis in place of aspirin.  5. Questionable HOCM: Note from [facility] dated 07/31/2018 indicates the patient was previous  admitted in 2018 for A. fib with RVR with echo showing HOCM.  Details of this are unclear.  It appears the patient was discharged with a LifeVest at that time though subsequently self discontinued this.  He did not follow-up with recommended cardiac MRI at that time or more recently in 07/2018.  Most [facility] showed moderately increased LV wall thickness.  Following restoration of sinus rhythm, optimization of evidence-based heart failure therapy, and repeat echocardiogram we will need to follow-up with a cardiac MRI.  Patient is in agreement with this.  6. Morbid obesity: Weight loss is advised.  In outpatient follow-up, patient should be referred to pulmonology for consideration of sleep study.  Disposition: F/u with Dr. Okey DupreEnd or an APP in 3-4 weeks.  Current medicines are reviewed at length with the patient today.  The patient did not have any concerns regarding medicines.  Signed, Eula Listenyan Tyriana Helmkamp, PA-C 08/21/2018 3:01 PM     [facility] [place] [address] [place], [place] [address] [phone]

## 2018-08-20 ENCOUNTER — Encounter: Payer: Self-pay | Admitting: Physician Assistant

## 2018-08-21 ENCOUNTER — Other Ambulatory Visit
Admission: RE | Admit: 2018-08-21 | Discharge: 2018-08-21 | Disposition: A | Discharge status: Home / Self Care | Payer: 59 | Source: Ambulatory Visit | Attending: Physician Assistant | Admitting: Physician Assistant

## 2018-08-21 ENCOUNTER — Other Ambulatory Visit: Payer: Self-pay

## 2018-08-21 ENCOUNTER — Encounter: Payer: Self-pay | Admitting: Physician Assistant

## 2018-08-21 ENCOUNTER — Ambulatory Visit (INDEPENDENT_AMBULATORY_CARE_PROVIDER_SITE_OTHER): Payer: 59 | Admitting: Physician Assistant

## 2018-08-21 VITALS — BP 110/70 | HR 116 | Temp 98.1°F | Ht 68.0 in | Wt 260.2 lb

## 2018-08-21 DIAGNOSIS — I4891 Unspecified atrial fibrillation: Secondary | ICD-10-CM

## 2018-08-21 DIAGNOSIS — R7989 Other specified abnormal findings of blood chemistry: Secondary | ICD-10-CM

## 2018-08-21 DIAGNOSIS — R778 Other specified abnormalities of plasma proteins: Secondary | ICD-10-CM

## 2018-08-21 DIAGNOSIS — I5022 Chronic systolic (congestive) heart failure: Secondary | ICD-10-CM

## 2018-08-21 DIAGNOSIS — I4819 Other persistent atrial fibrillation: Secondary | ICD-10-CM

## 2018-08-21 DIAGNOSIS — R000 Tachycardia, unspecified: Secondary | ICD-10-CM

## 2018-08-21 DIAGNOSIS — I428 Other cardiomyopathies: Secondary | ICD-10-CM | POA: Diagnosis not present

## 2018-08-21 DIAGNOSIS — I513 Intracardiac thrombosis, not elsewhere classified: Secondary | ICD-10-CM

## 2018-08-21 DIAGNOSIS — I43 Cardiomyopathy in diseases classified elsewhere: Secondary | ICD-10-CM

## 2018-08-21 LAB — BASIC METABOLIC PANEL
Anion gap: 10 (ref 5–15)
BUN: 14 mg/dL (ref 6–20)
CO2: 25 mmol/L (ref 22–32)
Calcium: 9.1 mg/dL (ref 8.9–10.3)
Chloride: 102 mmol/L (ref 98–111)
Creatinine, Ser: 1.42 mg/dL — ABNORMAL HIGH (ref 0.61–1.24)
GFR calc Af Amer: >60 mL/min (ref >60)
GFR calc non Af Amer: 56 mL/min — ABNORMAL LOW (ref >60)
Glucose, Bld: 99 mg/dL (ref 70–99)
Potassium: 4 mmol/L (ref 3.5–5.1)
Sodium: 137 mmol/L (ref 135–145)

## 2018-08-21 LAB — CBC
HCT: 43.5 % (ref 39.0–52.0)
Hemoglobin: 14 g/dL (ref 13.0–17.0)
MCH: 29.5 pg (ref 26.0–34.0)
MCHC: 32.2 g/dL (ref 30.0–36.0)
MCV: 91.6 fL (ref 80.0–100.0)
Platelets: 265 10*3/uL (ref 150–400)
RBC: 4.75 MIL/uL (ref 4.22–5.81)
RDW: 14.2 % (ref 11.5–15.5)
WBC: 8.1 10*3/uL (ref 4.0–10.5)
nRBC: 0 % (ref 0.0–0.2)

## 2018-08-21 LAB — DIGOXIN LEVEL: Digoxin Level: 1.4 ng/mL (ref 0.8–2.0)

## 2018-08-21 NOTE — Patient Instructions (Signed)
Medication Instructions:  Your physician recommends that you continue on your current medications as directed. Please refer to the Current Medication list given to you today.  If you need a refill on your cardiac medications before your next appointment, please call your pharmacy.   Lab work: TODAY: BMET, CBC & DIGOXIN LEVEL  If you have labs (blood work) drawn today and your tests are completely normal, you will receive your results only by: Marland Kitchen MyChart Message (if you have MyChart) OR . A paper copy in the mail If you have any lab test that is abnormal or we need to change your treatment, we will call you to review the results.  Testing/Procedures: None  Follow-Up: At [facility], you and your health needs are our priority.  As part of our continuing mission to provide you with exceptional heart care, we have created designated Provider Care Teams.  These Care Teams include your primary Cardiologist (physician) and Advanced Practice Providers (APPs -  Physician Assistants and Nurse Practitioners) who all work together to provide you with the care you need, when you need it. You will need a follow up appointment the first week in August . You may see Nelva Bush, MDor one of the following Advanced Practice Providers on your designated Care Team:       Christell Faith, PA-C  Any Other Special Instructions Will Be Listed Below (If Applicable).

## 2018-08-23 ENCOUNTER — Telehealth: Payer: Self-pay

## 2018-08-23 MED ORDER — DIGOXIN 125 MCG PO TABS: 0.1250 mg | ORAL_TABLET | Freq: Every day | ORAL | 5 refills | Status: DC

## 2018-08-23 MED ORDER — POTASSIUM CHLORIDE CRYS ER 20 MEQ PO TBCR: 20.0000 meq | EXTENDED_RELEASE_TABLET | Freq: Every day | ORAL | 5 refills | Status: DC | PRN

## 2018-08-23 MED ORDER — FUROSEMIDE 40 MG PO TABS: 40.0000 mg | ORAL_TABLET | Freq: Every day | ORAL | 5 refills | Status: DC | PRN

## 2018-08-23 NOTE — Telephone Encounter (Signed)
Pt made aware of lab results and  Christell Faith, PA recommendation with verbal understanding.

## 2018-08-23 NOTE — Telephone Encounter (Signed)
-----   Message from Rise Mu, PA-C sent at 08/22/2018  7:11 AM EDT ----- Digoxin level is mildly elevated.  Kidney function is mildly elevated.  Potassium is at goal.  Blood count is normal.   Please hold digoxin x 1 day then resume at 0.125 mg daily.  Please hold Lasix and KCl x 1 day then resume both at as needed dosing for weight gain, SOB, or lower extremity swelling.

## 2018-09-10 ENCOUNTER — Other Ambulatory Visit: Payer: Self-pay | Admitting: Internal Medicine

## 2018-09-10 MED ORDER — APIXABAN 5 MG PO TABS: 5.0000 mg | ORAL_TABLET | Freq: Two times a day (BID) | ORAL | 1 refills | Status: DC

## 2018-09-10 MED ORDER — FUROSEMIDE 40 MG PO TABS: 40.0000 mg | ORAL_TABLET | Freq: Every day | ORAL | 5 refills | Status: DC | PRN

## 2018-09-10 MED ORDER — POTASSIUM CHLORIDE CRYS ER 20 MEQ PO TBCR: 20.0000 meq | EXTENDED_RELEASE_TABLET | Freq: Every day | ORAL | 5 refills | Status: DC | PRN

## 2018-09-10 NOTE — Addendum Note (Signed)
Addended by: Dede Query R on: 09/10/2018 05:03 PM   Modules accepted: Orders

## 2018-09-10 NOTE — Telephone Encounter (Signed)
°*  STAT* If patient is at the pharmacy, call can be transferred to refill team.   1. Which medications need to be refilled? (please list name of each medication and dose if known)    Eliquis 5 mg po BID    Bisoprolol 5 mg po BID   Potassium 20 mEq PO q d prn if taking lasix   2. Which pharmacy/location (including street and city if local pharmacy) is medication to be sent to? [place]   3. Do they need a 30 day or 90 day supply? 90   Patient wants financial assistance information for medication .

## 2018-09-10 NOTE — Telephone Encounter (Signed)
Pt's wt 118 kg, age 53, SCr 1.42, CrCl 99, last ov w/ Dr. Saunders Revel 08/1418. Eliquis refilled as requested.

## 2018-09-10 NOTE — Telephone Encounter (Signed)
Please review for refill on Eliquis  

## 2018-09-11 ENCOUNTER — Telehealth: Payer: Self-pay | Admitting: Physician Assistant

## 2018-09-11 NOTE — Progress Notes (Deleted)
Cardiology Office Note    Date:  09/11/2018   ID:  Kenneth Ferguson, DOB 1965/12/25, MRN [id number]  PCP:  Patient, [name]  Cardiologist:  Yvonne Kendall, MD  Electrophysiologist:  None   Chief Complaint: Follow up  History of Present Illness:   Kenneth Ferguson is a 53 y.o. male with history of PAF with RVR status post TEE guided cardioversion in 2015 with reported recurrence of A. fib in 2018 details are unclear and more recently recurrence of A. fib in 07/2018 as outlined below, HFrEF, left atrial appendage thrombus, questionable history of HOCM, morbid obesity, and acute stress reaction who presents for follow up.  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  He reportedly was found to have a murmur on a racing physical and was referred to cardiology at that time.  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  Following this he did well up until 2015 when he developed new onset palpitations and was diagnosed with A. fib with RVR.  He underwent successful TEE guided cardioversion at outside hospital.  Following this procedure the patient reported he underwent diagnostic cardiac cath and was told he had normal coronaries and "the heart of a 53 year old."  He also stated the hospital would not let him leave unless he was wearing a LifeVest.  Details surrounding this are unavailable.  It appeared the patient did wear the LifeVest for a brief amount of time though ultimately self discontinued this.  He denied any shocks from the LifeVest.  He was previously been followed by outside cardiology group though was lost to follow-up.  He has self-reported a history of hypertrophic cardiomyopathy.  He was diagnosed with A. fib in 2015 and underwent TEE guided cardioversion.  He reports a prior cardiac catheterization around that time with details unclear.  He was apparently later admitted to  the hospital in 2018 with recurrent A. Fib.  More recently, he suffered the unexpected loss of his wife in 06/2018.  Following this acute stressor the patient felt like he went into A. fib.  Several weeks later he noted increased SOB/DOE.  He was treated at an outside urgent care for bronchitis and prescribed a Z-Pak without improvement in symptoms.  He subsequently developed bilateral lower extremity swelling and abdominal distention.  He presented to outside hospital where he was noted to be in A. fib with RVR and placed on metoprolol for rate control.  Notes indicate the patient self discontinued this medication as his heart rate on a pulse ox was noted to be in the 20s bpm per his report.  He was seen with a telephone visit on 07/31/2018 by Dr. Marney Setting with [facility] with recommendation to follow through with the prior recommended work-up including echo, outpatient cardiac monitoring, and cardiac MRI.  Patient did not follow through with this.  He was ultimately admitted to [facility] on 6/29 with A. fib with RVR and acute systolic CHF.  Transthoracic echo on 08/07/2018 showed severe hypokinesis of the left ventricle, entire anteroseptal wall, apical segment, and anterior wall, EF 40 to 45%, moderately increased LV wall thickness, moderately reduced RV systolic function, mild increased RV wall thickness, moderately dilated left atrium, moderate pericardial effusion posterior and lateral to the left ventricle, degenerative mitral valve with thickening, tricuspid aortic valve with mild calcification, aortic root normal in size and structure.  Plans were for  a TEE guided cardioversion however the TEE on 7/1 showed a left atrial appendage thrombus leading to cancellation of the cardioversion.  He was gently diuresed with improvement in shortness of breath.  His ventricular rates remained difficult to control during his admission though the patient was insistent on being discharged.  His high-sensitivity troponin was  mildly elevated with an initial value of 22 with a delta of 18 and felt to be in the setting of supply demand ischemia secondary to A. fib with RVR and acute systolic CHF which was felt to be tachycardia mediated.  Discharge weight 128.9 kg.  He was seen in follow up on 08/21/2018 and was feeling very well, stating he had not felt this well in several years. He reported compliance with medications and a stable weight at home around 250-260 pounds. He remained in Afib with RVR with ventricular rates in the 110s bpm with relative hypotension precluding escalation of beta blocker or addition of calcium channel blocker. Labs checked at that time showed a mildly elevated digoxin level as below leading to the decreasing of his digoxin to 0.125 mg daily after holding medication for 1 day.   ***  Labs: 08/2018 - WBC 8.1, HGB 14.0, PLT 265, K+ 4.0 SCr 1.42, digoxin 1.4, magnesium 2.2, TSH normal, A1c 4.8, BNP 480, COVID-19 negative, albumin 4.0, AST/ALT normal  Past Medical History:  Diagnosis Date  . HFrEF (heart failure with reduced ejection fraction) (HCC)   . Persistent atrial fibrillation   . 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 CARDIOVERSION N/A 08/08/2018   Procedure: CARDIOVERSION;  Surgeon: Antonieta IbaGollan, Timothy J, MD;  Location: [facility] ORS;  Service: Cardiovascular;  Laterality: N/A;  . TEE WITHOUT CARDIOVERSION N/A 08/08/2018   Procedure: TRANSESOPHAGEAL ECHOCARDIOGRAM (TEE);  Surgeon: Antonieta IbaGollan, Timothy J, MD;  Location: [facility] ORS;  Service: Cardiovascular;  Laterality: N/A;    Current Medications: No outpatient medications have been marked as taking for the 09/14/18 encounter (Appointment) with Sondra Bargesunn, Javarus Dorner M, PA-C.    Allergies:   Patient has no known allergies.   Social History   Socioeconomic History  . Marital status: Widowed    Spouse name: Not on file  . Number of children: Not on file  . Years of  education: Not on file  . Highest education level: Not on file  Occupational History  . Not on file  Social Needs  . Financial resource strain: Not on file  . Food insecurity    Worry: Not on file    Inability: Not on file  . Transportation needs    Medical: Not on file    Non-medical: Not on file  Tobacco Use  . Smoking status: Never Smoker  . Smokeless tobacco: Never Used  Substance and Sexual Activity  . Alcohol use: Never    Frequency: Never  . Drug use: Never  . Sexual activity: Not on file  Lifestyle  . Physical activity    Days per week: Not on file    Minutes per session: Not on file  . Stress: Not on file  Relationships  . Social Musicianconnections    Talks on phone: Not on file    Gets together: Not on file    Attends religious service: Not on file    Active member of club or organization: Not on file    Attends meetings of clubs or organizations: Not on file    Relationship status: Not  on file  Other Topics Concern  . Not on file  Social History Narrative  . Not on file     Family History:  The patient's family history includes Cancer in his father; Emphysema in his mother.  ROS:   ROS   EKGs/Labs/Other Studies Reviewed:    Studies reviewed were summarized above. The additional studies were reviewed today:  2D Echo 08/07/2018: 1. Severe hypokinesis of the left ventricular, entire anteroseptal wall, apical segment and anterior wall.  2. The left ventricle has mild-moderately reduced systolic function, with an ejection fraction of 40-45%. The cavity size was normal. There is moderately increased left ventricular wall thickness. Left ventricular diastolic function could not be  evaluated secondary to atrial fibrillation.  3. The right ventricle has moderately reduced systolic function. The cavity was not assessed. There is mildly increased right ventricular wall thickness. Right ventricular systolic pressure could not be assessed.  4. Left atrial size was  moderately dilated.  5. Moderate pericardial effusion.  6. The pericardial effusion is posterior and lateral to the left ventricle.  7. The mitral valve is degenerative. Mild thickening of the mitral valve leaflet.  8. The tricuspid valve is grossly normal.  9. The aortic valve is tricuspid. Mild calcification of the aortic valve. 10. The aortic root is normal in size and structure. 11. The inferior vena cava was dilated in size with <50% respiratory variability. Atrial fibrillation with rapid ventricular response noted throughout the study. __________  TEE 08/08/2018: 1. Evidence of a thrombus present in the left atrial appendage. Spontaneous contrast noted in the left atrium. Cardioversion was cancelled.  2. The left ventricle has mild-moderately reduced systolic function, with an ejection fraction of 40-45%. There is moderately increased left ventricular wall thickness. Left ventricular diastolic Doppler parameters are consistent with indeterminate  diastolic dysfunction.  3. The right ventricle has mildly reduced systolic function. There is no increase in right ventricular wall thickness. Right ventricular systolic pressure is mildly elevated.  4. Left atrial size was moderately dilated.  5. Right atrial size was moderately dilated.  6. Mild aortic atherosclerosis noted.   EKG:  EKG is ordered today.  The EKG ordered today demonstrates ***  Recent Labs: 08/06/2018: ALT 24; B Natriuretic Peptide 480.0 08/07/2018: TSH 2.126 08/10/2018: Magnesium 2.2 08/21/2018: BUN 14; Creatinine, Ser 1.42; Hemoglobin 14.0; Platelets 265; Potassium 4.0; Sodium 137  Recent Lipid Panel No results found for: CHOL, TRIG, HDL, CHOLHDL, VLDL, LDLCALC, LDLDIRECT  PHYSICAL EXAM:    VS:  There were no vitals taken for this visit.  BMI: There is no height or weight on file to calculate BMI.  Physical Exam  Wt Readings from Last 3 Encounters:  08/21/18 260 lb 4 oz (118 kg)  08/09/18 284 lb 2.8 oz (128.9 kg)      ASSESSMENT & PLAN:   1. ***  Disposition: F/u with Dr. Saunders Revel or an APP in ***.   Medication Adjustments/Labs and Tests Ordered: Current medicines are reviewed at length with the patient today.  Concerns regarding medicines are outlined above. Medication changes, Labs and Tests ordered today are summarized above and listed in the Patient Instructions accessible in Encounters.   Signed, Christell Faith, PA-C 09/11/2018 7:54 AM     [facility] [address] [place], [place] [address] [phone]

## 2018-09-11 NOTE — Telephone Encounter (Signed)
Spoke with patient.  He took his last pill of ELiquis this morning. He is out as of now.  He does have Pharmacist, community and has not tried the co-pay card.  He is about 15 min away right now. Advised him I will meet him at the [facility] with samples and copay card.  He was appreciative. Advised him to call the Eliquis 360 support number if he still cannot afford the medication.  Medication Samples have been provided to the patient.  Drug name: Eliquis       Strength: 5 mg        Qty: 1 box  LOT: [id number]  Exp.Date: Sep 2022

## 2018-09-11 NOTE — Telephone Encounter (Signed)
Please call to discuss alternative for Eliquis, patient has ran out and cannot afford to refill.

## 2018-09-12 ENCOUNTER — Other Ambulatory Visit: Payer: Self-pay | Admitting: *Deleted

## 2018-09-12 MED ORDER — BISOPROLOL FUMARATE 5 MG PO TABS: 5.0000 mg | ORAL_TABLET | Freq: Two times a day (BID) | ORAL | 1 refills | Status: DC

## 2018-09-12 NOTE — Telephone Encounter (Signed)
Requested Prescriptions   Signed Prescriptions Disp Refills  . bisoprolol (ZEBETA) 5 MG tablet 180 tablet 1    Sig: Take 1 tablet (5 mg total) by mouth 2 (two) times daily.    Authorizing Provider: Rise Mu    Ordering User: Britt Bottom

## 2018-09-12 NOTE — Telephone Encounter (Signed)
°*  STAT* If patient is at the pharmacy, call can be transferred to refill team.   1. Which medications need to be refilled? (please list name of each medication and dose if known) bisoprolol 5mg  bid  2. Which pharmacy/location (including street and city if local pharmacy) is medication to be sent to? [facility] on [address] in [place]   3. Do they need a 30 day or 90 day supply? [place]

## 2018-09-12 NOTE — Telephone Encounter (Signed)
Lmovm to verify correct pharmacy address.

## 2018-09-12 NOTE — Telephone Encounter (Signed)
Ok to refill 

## 2018-09-12 NOTE — Telephone Encounter (Signed)
Please advise if ok to refill Bisoprolol 5 mg tablet BID. Not originally filled by one of our providers.

## 2018-09-12 NOTE — Telephone Encounter (Signed)
Requested Prescriptions   Signed Prescriptions Disp Refills  . bisoprolol (ZEBETA) 5 MG tablet 180 tablet 1    Sig: Take 1 tablet (5 mg total) by mouth 2 (two) times daily.    Authorizing Provider: DUNN, RYAN M    Ordering User: Shireen Rayburn C   

## 2018-09-14 ENCOUNTER — Ambulatory Visit: Payer: 59 | Admitting: Physician Assistant

## 2018-09-19 NOTE — Progress Notes (Deleted)
Cardiology Office Note    Date:  09/19/2018   ID:  Kenneth ArgyleRobert Ferguson, DOB 02/21/1965, MRN [id number]  PCP:  Patient, [name]  Cardiologist:  Yvonne Kendallhristopher End, MD  Electrophysiologist:  None   Chief Complaint: Follow up  History of Present Illness:   Kenneth ArgyleRobert Ferguson is a 53 y.o. male with history of PAF with RVR status post TEE guided cardioversion in 2015 with reported recurrence of A. fib in 2018 details are unclear and more recently recurrence of A. fib in 07/2018 as outlined below, HFrEF, left atrial appendage thrombus, questionable history of HOCM, morbid obesity, and acute stress reaction who presents for follow up.  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  He reportedly was found to have a murmur on a racing physical and was referred to cardiology at that time.  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  Following this he did well up until 2015 when he developed new onset palpitations and was diagnosed with A. fib with RVR.  He underwent successful TEE guided cardioversion at outside hospital.  Following this procedure the patient reported he underwent diagnostic cardiac cath and was told he had normal coronaries and "the heart of a 53 year old."  He also stated the hospital would not let him leave unless he was wearing a LifeVest.  Details surrounding this are unavailable.  It appeared the patient did wear the LifeVest for a brief amount of time though ultimately self discontinued this.  He denied any shocks from the LifeVest.  He was previously been followed by outside cardiology group though was lost to follow-up.  He has self-reported a history of hypertrophic cardiomyopathy.  He was diagnosed with A. fib in 2015 and underwent TEE guided cardioversion.  He reports a prior cardiac catheterization around that time with details unclear.  He was apparently later admitted  to the hospital in 2018 with recurrent A. Fib.  More recently, he suffered the unexpected loss of his wife in 06/2018.  Following this acute stressor the patient felt like he went into A. fib.  Several weeks later he noted increased SOB/DOE.  He was treated at an outside urgent care for bronchitis and prescribed a Z-Pak without improvement in symptoms.  He subsequently developed bilateral lower extremity swelling and abdominal distention.  He presented to outside hospital where he was noted to be in A. fib with RVR and placed on metoprolol for rate control.  Notes indicate the patient self discontinued this medication as his heart rate on a pulse ox was noted to be in the 20s bpm per his report.  He was seen with a telephone visit on 07/31/2018 by Dr. Marney SettingLevinsky with [facility] with recommendation to follow through with the prior recommended work-up including echo, outpatient cardiac monitoring, and cardiac MRI.  Patient did not follow through with this.  He was ultimately admitted [facility] on 6/29 with A. fib with RVR and acute systolic CHF.  Transthoracic echo on 08/07/2018 showed severe hypokinesis of the left ventricle, entire anteroseptal wall, apical segment, and anterior wall, EF 40 to 45%, moderately increased LV wall thickness, moderately reduced RV systolic function, mild increased RV wall thickness, moderately dilated left atrium, moderate pericardial effusion posterior and lateral to the left ventricle, degenerative mitral valve with thickening, tricuspid aortic valve with mild calcification, aortic root normal in size and structure.  Plans were for  a TEE guided cardioversion however the TEE on 7/1 showed a left atrial appendage thrombus leading to cancellation of the cardioversion.  He was gently diuresed with improvement in shortness of breath.  His ventricular rates remained difficult to control during his admission though the patient was insistent on being discharged.  His high-sensitivity troponin was  mildly elevated with an initial value of 22 with a delta of 18 and felt to be in the setting of supply demand ischemia secondary to A. fib with RVR and acute systolic CHF which was felt to be tachycardia mediated.  Discharge weight 128.9 kg.  He was seen in follow up on 08/21/2018 and was feeling very well, stating he had not felt this well in several years. He reported compliance with medications and a stable weight at home around 250-260 pounds. He remained in Afib with RVR with ventricular rates in the 110s bpm with relative hypotension precluding escalation of beta blocker or addition of calcium channel blocker. Labs checked at that time showed a mildly elevated digoxin level as below leading to the decreasing of his digoxin to 0.125 mg daily after holding medication for 1 day.   ***  Labs: 08/2018 - WBC 8.1, HGB 14.0, PLT 265, K+ 4.0 SCr 1.42, digoxin 1.4, magnesium 2.2, TSH normal, A1c 4.8, BNP 480, COVID-19 negative, albumin 4.0, AST/ALT normal  Past Medical History:  Diagnosis Date  . HFrEF (heart failure with reduced ejection fraction) (HCC)   . Persistent atrial fibrillation   . 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 CARDIOVERSION N/A 08/08/2018   Procedure: CARDIOVERSION;  Surgeon: Antonieta Iba, MD;  Location: [facility] ORS;  Service: Cardiovascular;  Laterality: N/A;  . TEE WITHOUT CARDIOVERSION N/A 08/08/2018   Procedure: TRANSESOPHAGEAL ECHOCARDIOGRAM (TEE);  Surgeon: Antonieta Iba, MD;  Location: [facility] ORS;  Service: Cardiovascular;  Laterality: N/A;    Current Medications: No outpatient medications have been marked as taking for the 09/20/18 encounter (Appointment) with Sondra Barges, PA-C.    Allergies:   Patient has no known allergies.   Social History   Socioeconomic History  . Marital status: Widowed    Spouse name: Not on file  . Number of children: Not on file  . Years of  education: Not on file  . Highest education level: Not on file  Occupational History  . Not on file  Social Needs  . Financial resource strain: Not on file  . Food insecurity    Worry: Not on file    Inability: Not on file  . Transportation needs    Medical: Not on file    Non-medical: Not on file  Tobacco Use  . Smoking status: Never Smoker  . Smokeless tobacco: Never Used  Substance and Sexual Activity  . Alcohol use: Never    Frequency: Never  . Drug use: Never  . Sexual activity: Not on file  Lifestyle  . Physical activity    Days per week: Not on file    Minutes per session: Not on file  . Stress: Not on file  Relationships  . Social Musician on phone: Not on file    Gets together: Not on file    Attends religious service: Not on file    Active member of club or organization: Not on file    Attends meetings of clubs or organizations: Not on file    Relationship status: Not  on file  Other Topics Concern  . Not on file  Social History Narrative  . Not on file     Family History:  The patient's family history includes Cancer in his father; Emphysema in his mother.  ROS:   ROS   EKGs/Labs/Other Studies Reviewed:    Studies reviewed were summarized above. The additional studies were reviewed today:  2D Echo 08/07/2018: 1. Severe hypokinesis of the left ventricular, entire anteroseptal wall, apical segment and anterior wall.  2. The left ventricle has mild-moderately reduced systolic function, with an ejection fraction of 40-45%. The cavity size was normal. There is moderately increased left ventricular wall thickness. Left ventricular diastolic function could not be  evaluated secondary to atrial fibrillation.  3. The right ventricle has moderately reduced systolic function. The cavity was not assessed. There is mildly increased right ventricular wall thickness. Right ventricular systolic pressure could not be assessed.  4. Left atrial size was  moderately dilated.  5. Moderate pericardial effusion.  6. The pericardial effusion is posterior and lateral to the left ventricle.  7. The mitral valve is degenerative. Mild thickening of the mitral valve leaflet.  8. The tricuspid valve is grossly normal.  9. The aortic valve is tricuspid. Mild calcification of the aortic valve. 10. The aortic root is normal in size and structure. 11. The inferior vena cava was dilated in size with <50% respiratory variability. Atrial fibrillation with rapid ventricular response noted throughout the study. __________  TEE 08/08/2018: 1. Evidence of a thrombus present in the left atrial appendage. Spontaneous contrast noted in the left atrium. Cardioversion was cancelled.  2. The left ventricle has mild-moderately reduced systolic function, with an ejection fraction of 40-45%. There is moderately increased left ventricular wall thickness. Left ventricular diastolic Doppler parameters are consistent with indeterminate  diastolic dysfunction.  3. The right ventricle has mildly reduced systolic function. There is no increase in right ventricular wall thickness. Right ventricular systolic pressure is mildly elevated.  4. Left atrial size was moderately dilated.  5. Right atrial size was moderately dilated.  6. Mild aortic atherosclerosis noted.   EKG:  EKG is ordered today.  The EKG ordered today demonstrates ***  Recent Labs: 08/06/2018: ALT 24; B Natriuretic Peptide 480.0 08/07/2018: TSH 2.126 08/10/2018: Magnesium 2.2 08/21/2018: BUN 14; Creatinine, Ser 1.42; Hemoglobin 14.0; Platelets 265; Potassium 4.0; Sodium 137  Recent Lipid Panel No results found for: CHOL, TRIG, HDL, CHOLHDL, VLDL, LDLCALC, LDLDIRECT  PHYSICAL EXAM:    VS:  There were no vitals taken for this visit.  BMI: There is no height or weight on file to calculate BMI.  Physical Exam  Wt Readings from Last 3 Encounters:  08/21/18 260 lb 4 oz (118 kg)  08/09/18 284 lb 2.8 oz (128.9 kg)      ASSESSMENT & PLAN:   1. ***  Disposition: F/u with Dr. Saunders Revel or an APP in ***.   Medication Adjustments/Labs and Tests Ordered: Current medicines are reviewed at length with the patient today.  Concerns regarding medicines are outlined above. Medication changes, Labs and Tests ordered today are summarized above and listed in the Patient Instructions accessible in Encounters.   Signed, Christell Faith, PA-C 09/19/2018 11:01 AM     [facility] [address] [place] [address] [place], [place] [address] [phone]

## 2018-09-20 ENCOUNTER — Ambulatory Visit: Payer: 59 | Admitting: Internal Medicine

## 2018-09-20 ENCOUNTER — Ambulatory Visit: Payer: 59 | Admitting: Physician Assistant

## 2018-10-01 ENCOUNTER — Ambulatory Visit: Payer: 59 | Admitting: Nurse Practitioner

## 2018-10-10 ENCOUNTER — Ambulatory Visit (INDEPENDENT_AMBULATORY_CARE_PROVIDER_SITE_OTHER): Payer: 59 | Admitting: Nurse Practitioner

## 2018-10-10 ENCOUNTER — Other Ambulatory Visit: Payer: Self-pay

## 2018-10-10 ENCOUNTER — Encounter: Payer: Self-pay | Admitting: Nurse Practitioner

## 2018-10-10 ENCOUNTER — Other Ambulatory Visit: Payer: Self-pay | Admitting: Internal Medicine

## 2018-10-10 VITALS — BP 118/64 | HR 120 | Ht 68.0 in | Wt 261.0 lb

## 2018-10-10 DIAGNOSIS — I513 Intracardiac thrombosis, not elsewhere classified: Secondary | ICD-10-CM | POA: Diagnosis not present

## 2018-10-10 DIAGNOSIS — I5022 Chronic systolic (congestive) heart failure: Secondary | ICD-10-CM | POA: Diagnosis not present

## 2018-10-10 DIAGNOSIS — I4819 Other persistent atrial fibrillation: Secondary | ICD-10-CM | POA: Diagnosis not present

## 2018-10-10 DIAGNOSIS — R000 Tachycardia, unspecified: Secondary | ICD-10-CM

## 2018-10-10 DIAGNOSIS — I43 Cardiomyopathy in diseases classified elsewhere: Secondary | ICD-10-CM

## 2018-10-10 MED ORDER — APIXABAN 5 MG PO TABS: 5.0000 mg | ORAL_TABLET | Freq: Two times a day (BID) | ORAL | 1 refills | Status: DC

## 2018-10-10 NOTE — Telephone Encounter (Signed)
Pt's age 53, wt 118.4 kg, SCr 1.42, CrCl 84.65, appt w/ Ignacia Bayley, NP today. Refill of Eliquis 5 mg BID sent in as requested.

## 2018-10-10 NOTE — Telephone Encounter (Signed)
°*  STAT* If patient is at the pharmacy, call can be transferred to refill team.   1. Which medications need to be refilled? (please list name of each medication and dose if known) Eliquis 5 MG 1 tablet 2 times daily    2. Which pharmacy/location (including street and city if local pharmacy) is medication to be sent to? [facility] on [address]   3. Do they need a 30 day or 90 day supply? 90 day

## 2018-10-10 NOTE — Telephone Encounter (Signed)
Refill request

## 2018-10-10 NOTE — Patient Instructions (Signed)
Medication Instructions:  1- STOP Digoxin If you need a refill on your cardiac medications before your next appointment, please call your pharmacy.   Lab work: 1- Covid testing will schedule when appt time for DCCV made 2- Labs (please have 1 week prior to DCCV) If you have labs (blood work) drawn today and your tests are completely normal, you will receive your results only by: Marland Kitchen MyChart Message (if you have MyChart) OR . A paper copy in the mail If you have any lab test that is abnormal or we need to change your treatment, we will call you to review the results.  Testing/Procedures: 1- Date ______ Time_______(Please arrive 1 hr prior to scheduled time.  Location: [place]  DIET: Nothing to eat or drink after midnight except a sip of water with medications (see medication instructions below)  Medication Instructions: Hold Lasix  Continue your anticoagulant: Eliquis You will need to continue your anticoagulant after your procedure until you are told by your Provider that it is safe to stop  You must have a responsible person to drive you home and stay in the waiting area during your procedure. Failure to do so could result in cancellation.  Bring your insurance cards.  *Special Note: Every effort is made to have your procedure done on time. Occasionally there are emergencies that occur at the hospital that may cause delays. Please be patient if a delay does occur.   TEE Your physician has requested that you have a TEE/Cardioversion. During a TEE, sound waves are used to create images of your heart. It provides your doctor with information about the size and shape of your heart and how well your heart's chambers and valves are working. In this test, a transducer is attached to the end of a flexible tube that is guided down you throat and into your esophagus (the tube leading from your mouth to your stomach) to get a more detailed image of your heart. Once the TEE has determined  that a blood clot is not present, the cardioversion begins. Electrical Cardioversion uses a jolt of electricity to your heart either through paddles or wired patches attached to your chest. This is a controlled, usually prescheduled, procedure. This procedure is done at the hospital and you are not awake during the procedure. You usually go home the day of the procedure. Please see the instruction sheet given to you today for more information.   Follow-Up: At [facility], you and your health needs are our priority.  As part of our continuing mission to provide you with exceptional heart care, we have created designated Provider Care Teams.  These Care Teams include your primary Cardiologist (physician) and Advanced Practice Providers (APPs -  Physician Assistants and Nurse Practitioners) who all work together to provide you with the care you need, when you need it. You will need a follow up appointment in 6 weeks.   You may see Nelva Bush, MD or Murray Hodgkins, NP.

## 2018-10-10 NOTE — Progress Notes (Signed)
Office Visit    Patient Name: Kenneth Ferguson Date of Encounter: 10/10/2018  Primary Care Provider:  Patient, [name] Primary Cardiologist:  Nelva Bush, MD  Chief Complaint    53 year old male with a history of paroxysmal atrial fibrillation, HFrEF, left atrial appendage thrombus, questionable history of HOCM, morbid obesity, and acute stress reaction, who presents for follow-up of atrial fibrillation.  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    Past Surgical History:  Procedure Laterality Date   CARDIOVERSION N/A 08/08/2018   Procedure: CARDIOVERSION;  Surgeon: Minna Merritts, MD;  Location: [facility] ORS;  Service: Cardiovascular;  Laterality: N/A;   TEE WITHOUT CARDIOVERSION N/A 08/08/2018   Procedure: TRANSESOPHAGEAL ECHOCARDIOGRAM (TEE);  Surgeon: Minna Merritts, MD;  Location: [facility] ORS;  Service: Cardiovascular;  Laterality: N/A;    Allergies  No Known Allergies  History of Present Illness    53 year old male with the above past medical history including paroxysmal atrial fibrillation, HFrEF, left atrial appendage thrombus, questionable history of HOCM, morbid obesity, and acute stress reaction.  Cardiac history dates back to the early 2000's, when he was living in [place].  He was reportedly found to have a murmur on routine physical and was referred to cardiology.  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  He did well until 2015, when he was diagnosed with atrial fibrillation.  He underwent successful TEE guided cardioversion at an outside hospital.  Apparently diagnostic catheterization was performed sometime afterward and he was told he had normal coronary arteries.  In 2018, he was apparently admitted with recurrent atrial fibrillation at  which time he was apparently diagnosed with HOCM ([place]).  A LifeVest was placed, which he self discontinued, and he was subsequently lost to follow-up.  More recently, in May of this year, he suffered the unexpected loss of his wife which resulted in acute stress reaction and recurrent atrial fibrillation.  He subsequently noted worsening dyspnea (despite treatment for bronchitis), followed by bilateral lower extremity edema.  He was seen in outside hospital and diagnosed with A. fib with RVR and placed on beta-blocker.  He discontinued beta-blocker due to concerns for bradycardia on home pulse oximetry (reported rates in the 20s).  He was seen by his cardiologist in [place] on June 23 with recommendation for follow through with prior recommended work-up including echo, outpatient cardiac monitoring, and cardiac MRI.  He did not follow through with this and was ultimately admitted to [facility] on June 29 with A. fib with RVR and acute systolic CHF.  Echo on June 30 showed severe hypokinesis of the LV, entire anteroseptal wall, apical segment, and anterior wall.  EF was 40 to 45% with moderately increased LV wall thickness, moderately reduced RV systolic function, mildly increased RV wall thickness, moderately dilated left atrium, moderate pericardial effusion posterior and lateral to the left ventricle.  TEE on July 1 showed left atrial appendage thrombus and thus he was unable to be cardioverted.  He required gentle diuresis.  Heart rates were difficult to control despite beta-blocker and digoxin therapy.  At follow-up July 14, blood pressure was soft thus preventing any additional titration of AV nodal blocking agents in the setting of a heart rate of 116.  He has been compliant with his Eliquis  and arrangements were made for follow-up today to reevaluate rhythm and pursue TEE and cardioversion if appropriate.  Labwork on 7/14 revealed mildly elevated digoxin level and the dose was reduced to 0.125mg  daily.   Lasix was changed to prn in the setting of mild creat elevated @ 1.42.  Since his last visit, he says that he has done quite well.  He denies chest pain, dyspnea, or palpitations, though he is aware that he has remained in atrial fibrillation.  He has been using Lasix 1-2 times per week and has not had any significant lower extremity swelling.  He was not able to tolerate digoxin, stating that it made him feel malaise and fatigue.  This resolved after stopping it.  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  He says it is pretty typical to note heart rates in the 80s to about 105.  His heart rate is elevated today at 121.  He says he thinks that is because he twisted his ankle during his shift overnight, at about 4 AM, and he has significant ankle pain which might be driving his heart rate.  He is interested in pursuing TEE and cardioversion but his shifts are about to get changed and he would like to wait until he is sure of what his work schedule is prior to scheduling.  He has been compliant with his medications including Eliquis.  Home Medications    Prior to Admission medications   Medication Sig Start Date End Date Taking? Authorizing Provider  apixaban (ELIQUIS) 5 MG TABS tablet Take 1 tablet (5 mg total) by mouth 2 (two) times daily. 09/10/18   End, Cristal Deer, MD  bisoprolol (ZEBETA) 5 MG tablet Take 1 tablet (5 mg total) by mouth 2 (two) times daily. 09/12/18   Dunn, Raymon Mutton, PA-C  digoxin (LANOXIN) 0.125 MG tablet Take 1 tablet (0.125 mg total) by mouth daily. 08/23/18   Dunn, Raymon Mutton, PA-C  furosemide (LASIX) 40 MG tablet Take 1 tablet (40 mg total) by mouth daily as needed for fluid or edema. 09/10/18   Dunn, Raymon Mutton, PA-C  potassium chloride SA (K-DUR) 20 MEQ tablet Take 1 tablet (20 mEq total) by mouth daily as needed (on the days you take lasix). 09/10/18   Sondra Barges, PA-C   Family Hx    Family History  Problem Relation Age of Onset   Emphysema Mother     Cancer Father     Social Hx     Social History   Socioeconomic History   Marital status: Widowed    Spouse name: Not on file   Number of children: Not on file   Years of education: Not on file   Highest education level: Not on file  Occupational History   Not on file  Social Needs   Financial resource strain: Not on file   Food insecurity    Worry: Not on file    Inability: Not on file   Transportation needs    Medical: Not on file    Non-medical: Not on file  Tobacco Use   Smoking status: Never Smoker   Smokeless tobacco: Never Used  Substance and Sexual Activity   Alcohol use: Never    Frequency: Never   Drug use: Never   Sexual activity: Not on file  Lifestyle   Physical activity    Days per week: Not on file    Minutes per session: Not on file   Stress: Not on  file  Relationships   Social connections    Talks on phone: Not on file    Gets together: Not on file    Attends religious service: Not on file    Active member of club or organization: Not on file    Attends meetings of clubs or organizations: Not on file    Relationship status: Not on file   Intimate partner violence    Fear of current or ex partner: Not on file    Emotionally abused: Not on file    Physically abused: Not on file    Forced sexual activity: Not on file  Other Topics Concern   Not on file  Social History Narrative   Not on file   Review of Systems    He remains in A. fib though he is aware of this, he denies palpitations, chest pain, dyspnea.  No PND, orthopnea, dizziness, syncope, or early satiety.  He occasionally notes mild lower extremity swelling and takes Lasix about twice a week.  He continues to grieve for the loss of his wife but has not sought help through his insurance company and is receiving counseling.  All other systems reviewed and are otherwise negative except as noted above.  Physical Exam    VS:  BP 118/64 (BP Location: Left Arm, Patient  Position: Sitting, Cuff Size: Large)    Pulse (!) 120    Ht 5\' 8"  (1.727 m)    Wt 261 lb (118.4 kg)    SpO2 98%    BMI 39.68 kg/m  , BMI Body mass index is 39.68 kg/m. GEN: Obese, in no acute distress. HEENT: normal. Neck: Supple, no JVD, carotid bruits, or masses. Cardiac: Irregularly irregular, no murmurs, rubs, or gallops. No clubbing, cyanosis, edema.  Radials/2+ and equal bilaterally.  Respiratory:  Respirations regular and unlabored, clear to auscultation bilaterally. GI: Soft, nontender, nondistended, BS + x 4. MS: no deformity or atrophy. Skin: warm and dry, no rash. Neuro:  Strength and sensation are intact. Psych: Normal affect.  Accessory Clinical Findings    ECG personally reviewed by me today -atrial fibrillation, 121, septal infarct - no acute changes.  Lab Results  Component Value Date   WBC 8.1 08/21/2018   HGB 14.0 08/21/2018   HCT 43.5 08/21/2018   MCV 91.6 08/21/2018   PLT 265 08/21/2018   Lab Results  Component Value Date   CREATININE 1.42 (H) 08/21/2018   BUN 14 08/21/2018   NA 137 08/21/2018   K 4.0 08/21/2018   CL 102 08/21/2018   CO2 25 08/21/2018   Lab Results  Component Value Date   ALT 24 08/06/2018   AST 35 08/06/2018   ALKPHOS 77 08/06/2018   BILITOT 2.0 (H) 08/06/2018    Assessment & Plan    1.  Persistent atrial fibrillation with rapid ventricular response: A. fib initially diagnosed in 2015 and he was cardioverted at that time and he believes he developed recurrent atrial fibrillation in May of this year following the death of his wife.  Hospitalized in June for heart failure and A. fib but could not be cardioverted secondary to left atrial appendage thrombus.  He has been compliant with beta-blocker and Eliquis though he was not able to tolerate digoxin secondary to malaise and fatigue.  His heart rate is elevated today though he says he is experiencing significant ankle pain which she feels might be driving his heart rate some as he  typically sees heart rates in the 80s to low  100s when he checks it at work.  He wishes to pursue TEE and cardioversion however, he has to work some things out with his employer and his work schedule.  He will give us a call back to schedule.  In the meantime, continue current dose of bisoprolol with dose being limited by soft blood pressures, and Eliquis.  2.  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  He is euvolemic today and remains on beta-blocker therapy.  He takes Lasix once or twice a week based on lower extremity swelling.  No ACE/ARB/MRA secondary to history of soft blood pressures.  As above, ultimate goal is cardioversion with plan for follow-up echo 2 to 3 months after.  3.  Left atrial appendage thrombus: Noted on TEE on July 1.  He reports compliance with Eliquis.  As noted above, plan for repeat TEE prior to cardioversion.  4.  Elevated troponin: Noted during hospitalization for A. fib and CHF.  If EF remains low despite conversion to sinus rhythm, will need ischemic evaluation.  5.  History of LVH with reported history of hypertrophic cardiomyopathy: Echo performed during hospitalization in June of this year did not support a diagnosis of hypertrophic cardiomyopathy.  Plan for follow-up echo after restoration of sinus rhythm and determine whether or not further work-up for hypertrophic cardiomyopathy would be appropriate-cardiac MRI.  6.  Morbid obesity: Patient says he is trying to lose weight the weight is stable compared to July.  Will need outpatient sleep study following management of A. Fib.  7.  Disposition: Patient is going to call us to let us know when we can schedule TEE and cardioversion.  He will need COVID-19 testing several days in advance.  Plan for follow-up in approximately 6 weeks, assuming he will get this done in the next month.   Nicolasa Duckinghristopher Tevis Conger, NP 10/10/2018, 12:28 PM

## 2018-10-19 ENCOUNTER — Telehealth: Payer: Self-pay | Admitting: Internal Medicine

## 2018-10-19 NOTE — Telephone Encounter (Signed)
Patient calling in wanting to schedule cardioversion and preprocedure testing. Please advise patient when able

## 2018-10-22 NOTE — Telephone Encounter (Signed)
No answer. Left message to call back.   

## 2018-10-22 NOTE — Telephone Encounter (Signed)
No answer. Left message to call back.  Looking at scheduling on 10/30/2018 for Dr End to perform.

## 2018-10-26 NOTE — Telephone Encounter (Signed)
Patient is returning your call.  

## 2018-10-26 NOTE — Telephone Encounter (Signed)
Patient calling in again to schedule cardioversion and pre testing, please call when able

## 2018-10-26 NOTE — Telephone Encounter (Signed)
Patient calling in again to schedule cardioversion and pre testing, please call when able °

## 2018-10-30 NOTE — Telephone Encounter (Signed)
Spoke with patient and scheduled him for TEE/DCCV for October 6th by Dr End. 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  He says he will follow COVID precautions by wearing a mask, social distance and wash hands. States he should be able to do administrative type work that shift if it is not too busy. He verbalized understanding of the instructions below as well as is listed on his AVS.    You are scheduled for a Cardioversion on ____10/6/2020______ with Dr.__END_____ Please arrive at the [facility] at _6:30___ a.m. on the day of your procedure.  DIET INSTRUCTIONS:  Nothing to eat or drink after midnight except your medications with a sip of water.         1) Labs: __October 2__________ A. [facility] for lab work prior to [facility] testing for [employer].  2) Medications:  YOU MAY TAKE ALL of your remaining medications with a small amount of water.  3) Must have a responsible person to drive you home.  4) Bring a current list of your medications and current insurance cards.    If you have any questions after you get home, please call the office at [phone]

## 2018-11-02 ENCOUNTER — Telehealth: Payer: Self-pay | Admitting: Internal Medicine

## 2018-11-02 NOTE — Telephone Encounter (Signed)
Routing to Dr End. 

## 2018-11-02 NOTE — Telephone Encounter (Signed)
There is no cardiac contraindication for the flu vaccine for Kenneth Ferguson.  Nelva Bush, MD [facility] Pager: [phone]

## 2018-11-02 NOTE — Telephone Encounter (Signed)
Patient wants to know if flu shot is safe considering cardiac issues and meds   If not indicated please write a letter for work.    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

## 2018-11-02 NOTE — Telephone Encounter (Signed)
Patient notified and verbalized understanding that the flu shot is ok for him.

## 2018-11-09 ENCOUNTER — Other Ambulatory Visit: Admission: RE | Admit: 2018-11-09 | Payer: 59 | Source: Ambulatory Visit

## 2018-11-12 ENCOUNTER — Telehealth: Payer: Self-pay | Admitting: Internal Medicine

## 2018-11-12 NOTE — Telephone Encounter (Signed)
Preadmit called, states patient did not show for his COVID test. Patient has procedure tomorrow

## 2018-11-12 NOTE — Telephone Encounter (Signed)
Called patient and he said he had totally forgot about his COVID test.  Furthermore, he started feeling nauseas yesterday and has been dry heaving since then.  He is not sure if it is a virus or food poisoning. His son has similar symptoms. He would like to reschedule his TEE/Cardioversion.  Advised patient that we could reschedule for 11/20/18. He would still need COVID and lab work on Friday, 10/9 and arrive at [facility] on 10/13 for the procedure. He is aware of the other pre procedural instructions as listed on his AVS. Also, I went ahead and rescheduled his office follow up appointment to be a few weeks out.

## 2018-11-14 ENCOUNTER — Telehealth: Payer: Self-pay | Admitting: Internal Medicine

## 2018-11-14 NOTE — Telephone Encounter (Signed)
Pt c/o medication issue:  1. Name of Medication: bisoprolol   2. How are you currently taking this medication (dosage and times per day)? 5 MG 1 tablet 2 times daily   3. Are you having a reaction (difficulty breathing--STAT)? Low BP, sometimes chest tightness  4. What is your medication issue? Patient calling, states the bisoprolol does not seem to be working well for him.  When he takes medication his BP gets too low.  Right now without taking meds BP was 111/79.  Please call to discuss.

## 2018-11-14 NOTE — Telephone Encounter (Signed)
Patient calling back. Stopped taking Bisoprolol about 1 week ago due to it decreasing his BP and chest tightness. BP would drop as low as around 88/50 when taking the bisoprolol and then he would have chest tightness. Denies dizziness. He recently had a stomach virus and is trying to rehydrate as he can.  However, no he is feeling more frequent palpitations. He continues to work and feels "crappy" with the palpitations. He would like to know if there's another medication he can take instead of the bisoprolol from now until the TEE/DCCV on 11/20/18. Routing to Ignacia Bayley, NP for advice.

## 2018-11-14 NOTE — Telephone Encounter (Signed)
No answer. Left message to call back.   

## 2018-11-15 MED ORDER — BISOPROLOL FUMARATE 5 MG PO TABS: 2.5000 mg | ORAL_TABLET | Freq: Two times a day (BID) | ORAL | 1 refills | Status: DC

## 2018-11-15 NOTE — Telephone Encounter (Signed)
PATIENT IS RETURNING YOUR CALL °

## 2018-11-15 NOTE — Telephone Encounter (Signed)
As bisoprolol has helped him previously, he might be well-served by trying to take 1/2 tab BID, rather than switching to an alternate  blocker or calcium channel blocker.

## 2018-11-15 NOTE — Telephone Encounter (Signed)
Called patient and he verbalized understanding to take bisoprolol 2.5 mg two times a day. He was appreciative and is aware to go get COVID pretest tomorrow.

## 2018-11-15 NOTE — Telephone Encounter (Signed)
No answer. Left message to call back.   

## 2018-11-16 ENCOUNTER — Other Ambulatory Visit: Payer: Self-pay

## 2018-11-16 ENCOUNTER — Other Ambulatory Visit
Admission: RE | Admit: 2018-11-16 | Discharge: 2018-11-16 | Disposition: A | Discharge status: Home / Self Care | Payer: 59 | Source: Ambulatory Visit | Attending: Internal Medicine | Admitting: Internal Medicine

## 2018-11-19 ENCOUNTER — Encounter: Payer: Self-pay | Admitting: Anesthesiology

## 2018-11-19 ENCOUNTER — Other Ambulatory Visit
Admission: RE | Admit: 2018-11-19 | Discharge: 2018-11-19 | Disposition: A | Discharge status: Home / Self Care | Payer: 59 | Source: Ambulatory Visit | Attending: Nurse Practitioner | Admitting: Nurse Practitioner

## 2018-11-19 ENCOUNTER — Telehealth: Payer: Self-pay | Admitting: Internal Medicine

## 2018-11-19 DIAGNOSIS — Z01812 Encounter for preprocedural laboratory examination: Secondary | ICD-10-CM | POA: Insufficient documentation

## 2018-11-19 DIAGNOSIS — I4891 Unspecified atrial fibrillation: Secondary | ICD-10-CM | POA: Diagnosis not present

## 2018-11-19 DIAGNOSIS — Z20828 Contact with and (suspected) exposure to other viral communicable diseases: Secondary | ICD-10-CM | POA: Insufficient documentation

## 2018-11-19 LAB — BASIC METABOLIC PANEL
Anion gap: 5 (ref 5–15)
BUN: 12 mg/dL (ref 6–20)
CO2: 27 mmol/L (ref 22–32)
Calcium: 8.6 mg/dL — ABNORMAL LOW (ref 8.9–10.3)
Chloride: 102 mmol/L (ref 98–111)
Creatinine, Ser: 1.11 mg/dL (ref 0.61–1.24)
GFR calc Af Amer: >60 mL/min (ref >60)
GFR calc non Af Amer: >60 mL/min (ref >60)
Glucose, Bld: 115 mg/dL — ABNORMAL HIGH (ref 70–99)
Potassium: 3.5 mmol/L (ref 3.5–5.1)
Sodium: 134 mmol/L — ABNORMAL LOW (ref 135–145)

## 2018-11-19 LAB — CBC
HCT: 45 % (ref 39.0–52.0)
Hemoglobin: 14.6 g/dL (ref 13.0–17.0)
MCH: 29.7 pg (ref 26.0–34.0)
MCHC: 32.4 g/dL (ref 30.0–36.0)
MCV: 91.6 fL (ref 80.0–100.0)
Platelets: 202 10*3/uL (ref 150–400)
RBC: 4.91 MIL/uL (ref 4.22–5.81)
RDW: 16.1 % — ABNORMAL HIGH (ref 11.5–15.5)
WBC: 6.8 10*3/uL (ref 4.0–10.5)
nRBC: 0 % (ref 0.0–0.2)

## 2018-11-19 NOTE — Telephone Encounter (Signed)
Returned call to patient.   I see where labs are active in the system, so I am not sure why registration told him that no active labs were in.   [facility] and [facility] for repeat Cv swab.   I called [facility] to make sure that labs were active and they agreed that they looked fine from their end.   Returned call to patient to make him aware.   Advised pt to call for any further questions or concerns.

## 2018-11-19 NOTE — Telephone Encounter (Signed)
Patient calling States that the hospital called and they have misplaced COVID results  Patient also went to get labs and was told that they did not see there were any labs to be done Patient has been frustrated with events and would like to know next steps Please call to discuss

## 2018-11-20 ENCOUNTER — Ambulatory Visit: Admit: 2018-11-20 | Payer: 59 | Admitting: Internal Medicine

## 2018-11-20 ENCOUNTER — Ambulatory Visit (HOSPITAL_BASED_OUTPATIENT_CLINIC_OR_DEPARTMENT_OTHER)
Admission: RE | Admit: 2018-11-20 | Discharge: 2018-11-20 | Disposition: A | Discharge status: Home / Self Care | Payer: 59 | Source: Home / Self Care | Attending: Internal Medicine | Admitting: Internal Medicine

## 2018-11-20 ENCOUNTER — Other Ambulatory Visit: Payer: Self-pay

## 2018-11-20 ENCOUNTER — Ambulatory Visit
Admission: RE | Admit: 2018-11-20 | Discharge: 2018-11-20 | Disposition: A | Discharge status: Home / Self Care | Payer: 59 | Attending: Internal Medicine | Admitting: Internal Medicine

## 2018-11-20 ENCOUNTER — Encounter
Admission: RE | Disposition: A | Discharge status: Home / Self Care | Payer: Self-pay | Source: Home / Self Care | Attending: Internal Medicine

## 2018-11-20 ENCOUNTER — Ambulatory Visit: Payer: 59 | Admitting: Anesthesiology

## 2018-11-20 ENCOUNTER — Telehealth: Payer: Self-pay

## 2018-11-20 DIAGNOSIS — Z6838 Body mass index (BMI) 38.0-38.9, adult: Secondary | ICD-10-CM | POA: Diagnosis not present

## 2018-11-20 DIAGNOSIS — I313 Pericardial effusion (noninflammatory): Secondary | ICD-10-CM | POA: Insufficient documentation

## 2018-11-20 DIAGNOSIS — Z7901 Long term (current) use of anticoagulants: Secondary | ICD-10-CM | POA: Insufficient documentation

## 2018-11-20 DIAGNOSIS — R0789 Other chest pain: Secondary | ICD-10-CM | POA: Insufficient documentation

## 2018-11-20 DIAGNOSIS — Z825 Family history of asthma and other chronic lower respiratory diseases: Secondary | ICD-10-CM | POA: Insufficient documentation

## 2018-11-20 DIAGNOSIS — I4819 Other persistent atrial fibrillation: Secondary | ICD-10-CM | POA: Diagnosis not present

## 2018-11-20 DIAGNOSIS — Z809 Family history of malignant neoplasm, unspecified: Secondary | ICD-10-CM | POA: Diagnosis not present

## 2018-11-20 DIAGNOSIS — I480 Paroxysmal atrial fibrillation: Secondary | ICD-10-CM | POA: Insufficient documentation

## 2018-11-20 DIAGNOSIS — F430 Acute stress reaction: Secondary | ICD-10-CM | POA: Insufficient documentation

## 2018-11-20 DIAGNOSIS — Z79899 Other long term (current) drug therapy: Secondary | ICD-10-CM | POA: Insufficient documentation

## 2018-11-20 DIAGNOSIS — I340 Nonrheumatic mitral (valve) insufficiency: Secondary | ICD-10-CM

## 2018-11-20 DIAGNOSIS — I5022 Chronic systolic (congestive) heart failure: Secondary | ICD-10-CM | POA: Diagnosis not present

## 2018-11-20 HISTORY — PX: CARDIOVERSION: SHX1299

## 2018-11-20 HISTORY — PX: TEE WITHOUT CARDIOVERSION: SHX5443

## 2018-11-20 LAB — SARS CORONAVIRUS 2 (TAT 6-24 HRS): SARS Coronavirus 2: NEGATIVE

## 2018-11-20 SURGERY — ECHOCARDIOGRAM, TRANSESOPHAGEAL
Anesthesia: Monitor Anesthesia Care

## 2018-11-20 SURGERY — ECHOCARDIOGRAM, TRANSESOPHAGEAL
Anesthesia: General

## 2018-11-20 SURGERY — CARDIOVERSION
Anesthesia: Monitor Anesthesia Care

## 2018-11-20 MED ORDER — LIDOCAINE HCL (CARDIAC) PF 100 MG/5ML IV SOSY
PREFILLED_SYRINGE | INTRAVENOUS | Status: DC | PRN
  Administered 2018-11-20: 100 mg via INTRATRACHEAL

## 2018-11-20 MED ORDER — SODIUM CHLORIDE 0.9 % IV SOLN
INTRAVENOUS | Status: DC | PRN
  Administered 2018-11-20: 08:00:00 via INTRAVENOUS

## 2018-11-20 MED ORDER — PROPOFOL 10 MG/ML IV BOLUS
INTRAVENOUS | Status: DC | PRN
  Administered 2018-11-20: 50 mg via INTRAVENOUS
  Administered 2018-11-20: 20 mg via INTRAVENOUS
  Administered 2018-11-20 (×3): 10 mg via INTRAVENOUS
  Administered 2018-11-20: 20 mg via INTRAVENOUS
  Administered 2018-11-20: 10 mg via INTRAVENOUS

## 2018-11-20 MED ORDER — SUCCINYLCHOLINE CHLORIDE 20 MG/ML IJ SOLN
INTRAMUSCULAR | Status: AC
  Filled 2018-11-20: qty 1

## 2018-11-20 MED ORDER — FUROSEMIDE 40 MG PO TABS: 40.0000 mg | ORAL_TABLET | Freq: Every day | ORAL | 5 refills | Status: DC

## 2018-11-20 MED ORDER — SODIUM CHLORIDE 0.9 % IV SOLN: INTRAVENOUS | Status: DC

## 2018-11-20 MED ORDER — PROPOFOL 500 MG/50ML IV EMUL
INTRAVENOUS | Status: AC
  Filled 2018-11-20: qty 50

## 2018-11-20 MED ORDER — LIDOCAINE VISCOUS HCL 2 % MT SOLN
OROMUCOSAL | Status: AC
  Filled 2018-11-20: qty 15

## 2018-11-20 MED ORDER — BISOPROLOL FUMARATE 5 MG PO TABS: 2.5000 mg | ORAL_TABLET | Freq: Every day | ORAL | 1 refills | Status: DC

## 2018-11-20 NOTE — H&P (Signed)
Cardiology Admission History and Physical:   Patient ID: Kenneth Ferguson MRN: [id number]; DOB: 1965/05/01   Admission date: 11/20/2018  Primary Care Provider: Patient, [name] Primary Cardiologist: Nelva Bush, MD  Primary Electrophysiologist:  None   Chief Complaint:  Atrial fibrillation  Patient Profile:   Kenneth Ferguson is a 53 y.o. male with history of paroxysmal atrial fibrillation, HFrEF, left atrial appendage thrombus, questionable history of HOCM, morbid obesity, and acute stress reaction.  History of Present Illness:   Kenneth Ferguson reports feeling well, though he notes intermittent lightheadedness and soft blood pressure.  He has experienced intermittent chest tightness with low blood pressures, which he attributes primarily to taking bisoprolol.  Heart rates have been variable.  He continues to work without limitations.  He remains compliant with his medications, including apixaban.  Heart Pathway Score:     Past Medical History:  Diagnosis Date  . HFrEF (heart failure with reduced ejection fraction) ([place])   . Persistent atrial fibrillation ([place])   . 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 CARDIOVERSION N/A 08/08/2018   Procedure: CARDIOVERSION;  Surgeon: Minna Merritts, MD;  Location: [facility] ORS;  Service: Cardiovascular;  Laterality: N/A;  . TEE WITHOUT CARDIOVERSION N/A 08/08/2018   Procedure: TRANSESOPHAGEAL ECHOCARDIOGRAM (TEE);  Surgeon: Minna Merritts, MD;  Location: [facility] ORS;  Service: Cardiovascular;  Laterality: N/A;     Medications Prior to Admission: Prior to Admission medications   Medication Sig Start Date Mammie Meras Date Taking? Authorizing Provider  apixaban (ELIQUIS) 5 MG TABS tablet Take 1 tablet (5 mg total) by mouth 2 (two) times daily. 10/10/18  Yes Theora Gianotti, NP  furosemide (LASIX) 40 MG tablet Take 1 tablet (40 mg total) by mouth daily as needed  for fluid or edema. 09/10/18  Yes Dunn, Areta Haber, PA-C  potassium chloride SA (K-DUR) 20 MEQ tablet Take 1 tablet (20 mEq total) by mouth daily as needed (on the days you take lasix). 09/10/18  Yes Dunn, Areta Haber, PA-C  bisoprolol (ZEBETA) 5 MG tablet Take 0.5 tablets (2.5 mg total) by mouth 2 (two) times daily. 11/15/18   Theora Gianotti, NP     Allergies:   No Known Allergies  Social History:   Social History   Tobacco Use  . Smoking status: Never Smoker  . Smokeless tobacco: Never Used  Substance Use Topics  . Alcohol use: Never    Frequency: Never  . Drug use: Never     Family History:   The patient's family history includes Cancer in his father; Emphysema in his mother.    ROS:  Please see the history of present illness. All other ROS reviewed and negative.     Physical Exam/Data:  There were no vitals filed for this visit. No intake or output data in the 24 hours ending 11/20/18 0733 Last 3 Weights 10/10/2018 08/21/2018 08/09/2018  Weight (lbs) 261 lb 260 lb 4 oz 284 lb 2.8 oz  Weight (kg) 118.389 kg 118.049 kg 128.9 kg     There is no height or weight on file to calculate BMI.  General:  Well nourished, well developed, in no acute distress HEENT: normal Lymph: no adenopathy Neck: no sniffing and JVD Endocrine:  No thryomegaly Vascular: 2+ radial pulses bilaterally. Cardiac: Tachycardic but regular without murmurs. Lungs:  clear to auscultation bilaterally, no wheezing, rhonchi or rales  Abd: soft, nontender, no hepatomegaly  Ext: no lower  extremity edema Musculoskeletal:  No deformities, BUE and BLE strength normal and equal Skin: warm and dry  Neuro:  CNs 2-12 intact, no focal abnormalities noted Psych:  Normal affect    EKG:  The ECG that was done today at 7:19 AM was personally reviewed and demonstrates atrial fibrillation with rapid ventricular response with PVCs versus aberrancy, poor R wave progression, and nonspecific T wave changes.  Laboratory Data:   High Sensitivity Troponin:  No results for input(s): TROPONINIHS in the last 720 hours.    Chemistry Recent Labs  Lab 11/19/18 1428  NA 134*  K 3.5  CL 102  CO2 27  GLUCOSE 115*  BUN 12  CREATININE 1.11  CALCIUM 8.6*  GFRNONAA >60  GFRAA >60  ANIONGAP 5    No results for input(s): PROT, ALBUMIN, AST, ALT, ALKPHOS, BILITOT in the last 168 hours. Hematology Recent Labs  Lab 11/19/18 1428  WBC 6.8  RBC 4.91  HGB 14.6  HCT 45.0  MCV 91.6  MCH 29.7  MCHC 32.4  RDW 16.1*  PLT 202   BNPNo results for input(s): BNP, PROBNP in the last 168 hours.  DDimer No results for input(s): DDIMER in the last 168 hours.   Radiology/Studies:  No results found.  Assessment and Plan:   Persistent atrial fibrillation: Patient remains in atrial fibrillation with elevated heart rates.  He has been compliant with apixaban.  Prior attempt at TEE guided cardioversion was complicated by left atrial thrombus.  We will proceed with TEE and cardioversion (if appropriate) today.  The patient will need to remain on indefinite anticoagulation.  I discussed the procedures as well as the risks and benefits with Kenneth Ferguson, who agrees to proceed.  Chronic systolic heart failure: Patient appears euvolemic with NYHA class II symptoms.  Continue current medications; further adjustments based on outcome of TEE/DCCV today.  For questions or updates, please contact [facility] Please consult [email or web address] for contact info under [facility].   Signed, Yvonne Kendall, MD  11/20/2018 7:33 AM

## 2018-11-20 NOTE — Anesthesia Preprocedure Evaluation (Signed)
Anesthesia Evaluation  Patient identified by MRN, date of birth, ID band Patient awake    Reviewed: Allergy & Precautions, NPO status , Patient's Chart, lab work & pertinent test results, reviewed documented beta blocker date and time   Airway Mallampati: III  TM Distance: >3 FB     Dental  (+) Chipped   Pulmonary           Cardiovascular +CHF  + dysrhythmias Atrial Fibrillation      Neuro/Psych    GI/Hepatic   Endo/Other    Renal/GU      Musculoskeletal   Abdominal   Peds  Hematology   Anesthesia Other Findings Obese. Last echo showed 40-45. 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  Reproductive/Obstetrics                             Anesthesia Physical Anesthesia Plan  ASA: III  Anesthesia Plan: General   Post-op Pain Management:    Induction: Intravenous  PONV Risk Score and Plan:   Airway Management Planned:   Additional Equipment:   Intra-op Plan:   Post-operative Plan:   Informed Consent: I have reviewed the patients History and Physical, chart, labs and discussed the procedure including the risks, benefits and alternatives for the proposed anesthesia with the patient or authorized representative who has indicated his/her understanding and acceptance.       Plan Discussed with: CRNA  Anesthesia Plan Comments:         Anesthesia Quick Evaluation

## 2018-11-20 NOTE — Anesthesia Post-op Follow-up Note (Signed)
Anesthesia QCDR form completed.        

## 2018-11-20 NOTE — CV Procedure (Signed)
    Transesophageal Echocardiogram Note  Kenneth Ferguson [id number] 26-Jun-1965  Procedure: Transesophageal Echocardiogram Indications: Atrial fibrillation and left atrial appendage thrombus  Procedure Details Consent: Obtained Time Out: Verified patient identification, verified procedure, site/side was marked, verified correct patient position, special equipment/implants available, Radiology Safety Procedures followed,  medications/allergies/relevent history reviewed, required imaging and test results available.  Performed  Medications:  During this procedure the patient, the patient was sedated with propofol by anesthesia services.  Left Ventrical:  LVH with severely reduced LVEF (<25%).  Mitral Valve: Moderate MR  Left Atrium/ Left atrial appendage: No thrombus  Complications: Complications of transient desaturation that resolved with removal of TEE probe. Patient did tolerate procedure well.  Nelva Bush, MD 11/20/2018, 8:24 AM

## 2018-11-20 NOTE — Transfer of Care (Signed)
Immediate Anesthesia Transfer of Care Note  Patient: Kenneth Ferguson  Procedure(s) Performed: TRANSESOPHAGEAL ECHOCARDIOGRAM (TEE) (N/A ) CARDIOVERSION (N/A )  Patient Location: PACU  Anesthesia Type:General  Level of Consciousness: sedated  Airway & Oxygen Therapy: Patient Spontanous Breathing and Patient connected to face mask oxygen  Post-op Assessment: Report given to RN and Post -op Vital signs reviewed and stable  Post vital signs: Reviewed and stable  Last Vitals:  Vitals Value Taken Time  BP 111/85 11/20/18 0817  Temp    Pulse 102 11/20/18 0817  Resp 19 11/20/18 0817  SpO2 99 % 11/20/18 0817    Last Pain:  Vitals:   11/20/18 0817  PainSc: 0-No pain         Complications: No apparent anesthesia complications

## 2018-11-20 NOTE — CV Procedure (Signed)
    Cardioversion Note  Tarique Loveall [id number] 1965-11-16  Procedure: DC Cardioversion Indications: atrial fibrillation  Procedure Details Consent: Obtained Time Out: Verified patient identification, verified procedure, site/side was marked, verified correct patient position, special equipment/implants available, Radiology Safety Procedures followed,  medications/allergies/relevent history reviewed, required imaging and test results available.  Performed  The patient has been on adequate anticoagulation.  The patient received IV propofol by anesthesia for sedation.  Synchronous cardioversion was performed at 200 joules x 1.  The cardioversion was successful with restoration of sinus rhythm.  Complications: No apparent complications Patient did tolerate procedure well.  Nelva Bush., MD 11/20/2018, 8:26 AM

## 2018-11-20 NOTE — Anesthesia Postprocedure Evaluation (Signed)
Anesthesia Post Note  Patient: Kenneth Ferguson  Procedure(s) Performed: TRANSESOPHAGEAL ECHOCARDIOGRAM (TEE) (N/A ) CARDIOVERSION (N/A )  Patient location during evaluation: Specials Recovery Anesthesia Type: General Level of consciousness: awake and alert Pain management: pain level controlled Vital Signs Assessment: post-procedure vital signs reviewed and stable Respiratory status: spontaneous breathing, nonlabored ventilation, respiratory function stable and patient connected to nasal cannula oxygen Cardiovascular status: blood pressure returned to baseline and stable Postop Assessment: no apparent nausea or vomiting Anesthetic complications: no     Last Vitals:  Vitals:   11/20/18 0845 11/20/18 0856  BP: 102/84 95/84  Pulse: 96 66  Resp: (!) 21 19  Temp:    SpO2: 98% 96%    Last Pain:  Vitals:   11/20/18 0856  PainSc: 0-No pain                 Lisseth Brazeau S

## 2018-11-20 NOTE — Interval H&P Note (Signed)
History and Physical Interval Note:  11/20/2018 7:57 AM  Kenneth Ferguson  has presented today for surgery, with the diagnosis of atrial fibrillation.  The various methods of treatment have been discussed with the patient and family. After consideration of risks, benefits and other options for treatment, the patient has consented to  Procedure(s): TRANSESOPHAGEAL ECHOCARDIOGRAM (TEE) (N/A) CARDIOVERSION (N/A) as a surgical intervention.  The patient's history has been reviewed, patient examined, no change in status, stable for surgery.  I have reviewed the patient's chart and labs.  Questions were answered to the patient's satisfaction.     [name]

## 2018-11-20 NOTE — Telephone Encounter (Signed)
Phone note made in error ?

## 2018-11-20 NOTE — Progress Notes (Signed)
*  PRELIMINARY RESULTS* Echocardiogram Echocardiogram Transesophageal has been performed.  Sherrie Sport 11/20/2018, 8:22 AM

## 2018-11-21 ENCOUNTER — Encounter: Payer: Self-pay | Admitting: Internal Medicine

## 2018-11-21 ENCOUNTER — Ambulatory Visit: Payer: 59 | Admitting: Internal Medicine

## 2018-11-22 ENCOUNTER — Telehealth: Payer: Self-pay | Admitting: Internal Medicine

## 2018-11-22 MED ORDER — FUROSEMIDE 40 MG PO TABS: 40.0000 mg | ORAL_TABLET | Freq: Every day | ORAL | 5 refills | Status: DC

## 2018-11-22 MED ORDER — POTASSIUM CHLORIDE CRYS ER 20 MEQ PO TBCR: 20.0000 meq | EXTENDED_RELEASE_TABLET | Freq: Every day | ORAL | 5 refills | Status: DC

## 2018-11-22 NOTE — Telephone Encounter (Signed)
Please call to discuss Lasix and Bisoprolol doseage

## 2018-11-22 NOTE — Telephone Encounter (Signed)
I recommend continuing bisoprolol 2.5 mg daily.  If he is still swollen, it is okay to increase furosemide to 40 mg BID and potassium chloride to 20 mEq BID.  Nelva Bush, MD [facility] Pager: [phone]

## 2018-11-22 NOTE — Telephone Encounter (Signed)
Patient verbalized understanding of Dr Darnelle Bos recommendations. Med list updated.

## 2018-11-22 NOTE — Telephone Encounter (Signed)
Called and spoke with patient. Prior to procedure, he was taking bisoprolol 5 mg two times a day; however, had stopped it a few weeks prior. Wants to clarify what Dr End meant by taking half daily. He is aware the chart says bisoprolol 2.5 mg (o.5 tablet) daily but needs clarification.  Also, he is taking the furosemide 40 mg daily and the potassium 20 mEq daily. He still feels his fluid is up. Current weight is 265 lb and he believes his normal dry weight is more around 250 lb. He would like to know if he can increase his furosemide to help get his fluid down more quickly.  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  Advised I will route to Dr End for further advice and clarification.

## 2018-11-27 ENCOUNTER — Telehealth: Payer: Self-pay | Admitting: Internal Medicine

## 2018-11-27 NOTE — Telephone Encounter (Signed)
Patient verbalized understanding that ok to take furosemide either way. He will plan to take it 80 mg once a day. He will let us know if anything changes.

## 2018-11-27 NOTE — Telephone Encounter (Signed)
I think it is fine to do either 40 mg BID or 80 mg daily, based on what is most convenient for Kenneth Ferguson.

## 2018-11-27 NOTE — Telephone Encounter (Signed)
Routing to Dr End. Please review and advise.

## 2018-11-27 NOTE — Telephone Encounter (Signed)
Pt c/o medication issue:  1. Name of Medication: furosemide (LASIX)    2. How are you currently taking this medication (dosage and times per day)? 80 MG   3. Are you having a reaction (difficulty breathing--STAT)? no  4. What is your medication issue? Patient would like to know - when taking 80 MG is he able to take it all at once or should he divide up the times he takes 40 MG.  Please call to discuss, will be ok to leave a detailed message with advice, patient is at work.

## 2018-11-29 ENCOUNTER — Telehealth: Payer: Self-pay | Admitting: Internal Medicine

## 2018-11-29 NOTE — Telephone Encounter (Signed)
Pt c/o medication issue:  1. Name of Medication: Furosemide   2. How are you currently taking this medication (dosage and times per day)?  40 mg po q d and BID prn   3. Are you having a reaction (difficulty breathing--STAT)? No   4. What is your medication issue? Concerned about continued fluid and sob with current dose   Scheduled 10/23 to discuss with End

## 2018-11-30 ENCOUNTER — Encounter: Payer: Self-pay | Admitting: Internal Medicine

## 2018-11-30 ENCOUNTER — Inpatient Hospital Stay: Payer: 59

## 2018-11-30 ENCOUNTER — Ambulatory Visit (INDEPENDENT_AMBULATORY_CARE_PROVIDER_SITE_OTHER): Payer: 59 | Admitting: Internal Medicine

## 2018-11-30 ENCOUNTER — Inpatient Hospital Stay
Admission: AD | Admit: 2018-11-30 | Discharge: 2018-12-07 | DRG: 308 | Disposition: A | Discharge status: Home / Self Care | Payer: 59 | Source: Ambulatory Visit | Attending: Internal Medicine | Admitting: Internal Medicine

## 2018-11-30 ENCOUNTER — Inpatient Hospital Stay (HOSPITAL_COMMUNITY)
Admission: AD | Admit: 2018-11-30 | Discharge: 2018-11-30 | Disposition: A | Discharge status: Home / Self Care | Payer: 59 | Source: Ambulatory Visit | Attending: Specialist | Admitting: Specialist

## 2018-11-30 ENCOUNTER — Encounter: Payer: Self-pay | Admitting: Specialist

## 2018-11-30 ENCOUNTER — Other Ambulatory Visit: Payer: Self-pay

## 2018-11-30 VITALS — BP 120/92 | HR 194 | Ht 68.0 in | Wt 278.0 lb

## 2018-11-30 DIAGNOSIS — Z20828 Contact with and (suspected) exposure to other viral communicable diseases: Secondary | ICD-10-CM | POA: Diagnosis present

## 2018-11-30 DIAGNOSIS — I361 Nonrheumatic tricuspid (valve) insufficiency: Secondary | ICD-10-CM

## 2018-11-30 DIAGNOSIS — N179 Acute kidney failure, unspecified: Secondary | ICD-10-CM | POA: Diagnosis present

## 2018-11-30 DIAGNOSIS — I110 Hypertensive heart disease with heart failure: Secondary | ICD-10-CM | POA: Diagnosis present

## 2018-11-30 DIAGNOSIS — I313 Pericardial effusion (noninflammatory): Secondary | ICD-10-CM | POA: Insufficient documentation

## 2018-11-30 DIAGNOSIS — E876 Hypokalemia: Secondary | ICD-10-CM | POA: Diagnosis not present

## 2018-11-30 DIAGNOSIS — I4891 Unspecified atrial fibrillation: Secondary | ICD-10-CM | POA: Diagnosis not present

## 2018-11-30 DIAGNOSIS — I5023 Acute on chronic systolic (congestive) heart failure: Secondary | ICD-10-CM

## 2018-11-30 DIAGNOSIS — I420 Dilated cardiomyopathy: Secondary | ICD-10-CM | POA: Diagnosis not present

## 2018-11-30 DIAGNOSIS — I340 Nonrheumatic mitral (valve) insufficiency: Secondary | ICD-10-CM | POA: Diagnosis not present

## 2018-11-30 DIAGNOSIS — I429 Cardiomyopathy, unspecified: Secondary | ICD-10-CM | POA: Diagnosis present

## 2018-11-30 DIAGNOSIS — R7989 Other specified abnormal findings of blood chemistry: Secondary | ICD-10-CM | POA: Diagnosis not present

## 2018-11-30 DIAGNOSIS — Z79899 Other long term (current) drug therapy: Secondary | ICD-10-CM | POA: Diagnosis not present

## 2018-11-30 DIAGNOSIS — I959 Hypotension, unspecified: Secondary | ICD-10-CM | POA: Diagnosis not present

## 2018-11-30 DIAGNOSIS — Z7901 Long term (current) use of anticoagulants: Secondary | ICD-10-CM | POA: Diagnosis not present

## 2018-11-30 DIAGNOSIS — T501X5A Adverse effect of loop [high-ceiling] diuretics, initial encounter: Secondary | ICD-10-CM | POA: Diagnosis not present

## 2018-11-30 DIAGNOSIS — Z6841 Body Mass Index (BMI) 40.0 and over, adult: Secondary | ICD-10-CM

## 2018-11-30 DIAGNOSIS — I4819 Other persistent atrial fibrillation: Secondary | ICD-10-CM

## 2018-11-30 DIAGNOSIS — Z825 Family history of asthma and other chronic lower respiratory diseases: Secondary | ICD-10-CM | POA: Diagnosis not present

## 2018-11-30 DIAGNOSIS — I3139 Other pericardial effusion (noninflammatory): Secondary | ICD-10-CM | POA: Insufficient documentation

## 2018-11-30 DIAGNOSIS — I5020 Unspecified systolic (congestive) heart failure: Secondary | ICD-10-CM | POA: Diagnosis not present

## 2018-11-30 DIAGNOSIS — I5043 Acute on chronic combined systolic (congestive) and diastolic (congestive) heart failure: Secondary | ICD-10-CM | POA: Diagnosis not present

## 2018-11-30 DIAGNOSIS — R0602 Shortness of breath: Secondary | ICD-10-CM

## 2018-11-30 DIAGNOSIS — I501 Left ventricular failure: Secondary | ICD-10-CM | POA: Diagnosis not present

## 2018-11-30 LAB — MAGNESIUM: Magnesium: 2.2 mg/dL (ref 1.7–2.4)

## 2018-11-30 LAB — COMPREHENSIVE METABOLIC PANEL
ALT: 24 U/L (ref 0–44)
AST: 33 U/L (ref 15–41)
Albumin: 3.6 g/dL (ref 3.5–5.0)
Alkaline Phosphatase: 87 U/L (ref 38–126)
Anion gap: 15 (ref 5–15)
BUN: 14 mg/dL (ref 6–20)
CO2: 20 mmol/L — ABNORMAL LOW (ref 22–32)
Calcium: 8.6 mg/dL — ABNORMAL LOW (ref 8.9–10.3)
Chloride: 99 mmol/L (ref 98–111)
Creatinine, Ser: 1.34 mg/dL — ABNORMAL HIGH (ref 0.61–1.24)
GFR calc Af Amer: >60 mL/min (ref >60)
GFR calc non Af Amer: 60 mL/min — ABNORMAL LOW (ref >60)
Glucose, Bld: 122 mg/dL — ABNORMAL HIGH (ref 70–99)
Potassium: 3.5 mmol/L (ref 3.5–5.1)
Sodium: 134 mmol/L — ABNORMAL LOW (ref 135–145)
Total Bilirubin: 2.4 mg/dL — ABNORMAL HIGH (ref 0.3–1.2)
Total Protein: 7 g/dL (ref 6.5–8.1)

## 2018-11-30 LAB — BASIC METABOLIC PANEL
Anion gap: 12 (ref 5–15)
BUN: 15 mg/dL (ref 6–20)
CO2: 22 mmol/L (ref 22–32)
Calcium: 8.7 mg/dL — ABNORMAL LOW (ref 8.9–10.3)
Chloride: 100 mmol/L (ref 98–111)
Creatinine, Ser: 1.46 mg/dL — ABNORMAL HIGH (ref 0.61–1.24)
GFR calc Af Amer: >60 mL/min (ref >60)
GFR calc non Af Amer: 54 mL/min — ABNORMAL LOW (ref >60)
Glucose, Bld: 137 mg/dL — ABNORMAL HIGH (ref 70–99)
Potassium: 3.6 mmol/L (ref 3.5–5.1)
Sodium: 134 mmol/L — ABNORMAL LOW (ref 135–145)

## 2018-11-30 LAB — GLUCOSE, CAPILLARY: Glucose-Capillary: 98 mg/dL (ref 70–99)

## 2018-11-30 LAB — PROTIME-INR
INR: 2.1 — ABNORMAL HIGH (ref 0.8–1.2)
Prothrombin Time: 23.1 seconds — ABNORMAL HIGH (ref 11.4–15.2)

## 2018-11-30 LAB — TROPONIN I (HIGH SENSITIVITY)
Troponin I (High Sensitivity): 29 ng/L — ABNORMAL HIGH (ref <18)
Troponin I (High Sensitivity): 31 ng/L — ABNORMAL HIGH (ref <18)
Troponin I (High Sensitivity): 36 ng/L — ABNORMAL HIGH (ref <18)
Troponin I (High Sensitivity): 30 ng/L — ABNORMAL HIGH (ref <18)

## 2018-11-30 LAB — CBC
HCT: 46.4 % (ref 39.0–52.0)
Hemoglobin: 15.1 g/dL (ref 13.0–17.0)
MCH: 30.2 pg (ref 26.0–34.0)
MCHC: 32.5 g/dL (ref 30.0–36.0)
MCV: 92.8 fL (ref 80.0–100.0)
Platelets: 208 10*3/uL (ref 150–400)
RBC: 5 MIL/uL (ref 4.22–5.81)
RDW: 15.9 % — ABNORMAL HIGH (ref 11.5–15.5)
WBC: 8.6 10*3/uL (ref 4.0–10.5)
nRBC: 0 % (ref 0.0–0.2)

## 2018-11-30 LAB — MRSA PCR SCREENING: MRSA by PCR: NEGATIVE

## 2018-11-30 LAB — ECHOCARDIOGRAM LIMITED
Height: 68 in
Weight: 4317.49 oz

## 2018-11-30 LAB — PHOSPHORUS: Phosphorus: 3.5 mg/dL (ref 2.5–4.6)

## 2018-11-30 LAB — HEPARIN LEVEL (UNFRACTIONATED): Heparin Unfractionated: >3.6 IU/mL — ABNORMAL HIGH (ref 0.30–0.70)

## 2018-11-30 LAB — APTT: aPTT: 35 seconds (ref 24–36)

## 2018-11-30 LAB — SARS CORONAVIRUS 2 BY RT PCR (HOSPITAL ORDER, PERFORMED IN ~~LOC~~ HOSPITAL LAB): SARS Coronavirus 2: NEGATIVE

## 2018-11-30 MED ORDER — PROMETHAZINE HCL 25 MG/ML IJ SOLN
6.2500 mg | Freq: Four times a day (QID) | INTRAMUSCULAR | Status: DC | PRN
  Administered 2018-11-30: 19:00:00 6.25 mg via INTRAVENOUS
  Filled 2018-11-30: qty 1

## 2018-11-30 MED ORDER — POTASSIUM CHLORIDE 10 MEQ/100ML IV SOLN
10.0000 meq | INTRAVENOUS | Status: AC
  Filled 2018-11-30 (×2): qty 100

## 2018-11-30 MED ORDER — ONDANSETRON HCL 4 MG PO TABS: 4.0000 mg | ORAL_TABLET | Freq: Four times a day (QID) | ORAL | Status: DC | PRN

## 2018-11-30 MED ORDER — BISOPROLOL FUMARATE 5 MG PO TABS
2.5000 mg | ORAL_TABLET | Freq: Every day | ORAL | Status: DC
  Filled 2018-11-30: qty 0.5

## 2018-11-30 MED ORDER — ONDANSETRON HCL 4 MG/2ML IJ SOLN
4.0000 mg | Freq: Four times a day (QID) | INTRAMUSCULAR | Status: DC | PRN
  Administered 2018-11-30 (×2): 4 mg via INTRAVENOUS
  Filled 2018-11-30 (×2): qty 2

## 2018-11-30 MED ORDER — ACETAMINOPHEN 650 MG RE SUPP: 650.0000 mg | Freq: Four times a day (QID) | RECTAL | Status: DC | PRN

## 2018-11-30 MED ORDER — AMIODARONE HCL IN DEXTROSE 360-4.14 MG/200ML-% IV SOLN
60.0000 mg/h | INTRAVENOUS | Status: DC
  Administered 2018-11-30: 30 mg/h via INTRAVENOUS
  Administered 2018-12-01 – 2018-12-03 (×9): 60 mg/h via INTRAVENOUS
  Filled 2018-11-30 (×10): qty 200

## 2018-11-30 MED ORDER — ACETAMINOPHEN 325 MG PO TABS
650.0000 mg | ORAL_TABLET | Freq: Four times a day (QID) | ORAL | Status: DC | PRN
  Administered 2018-12-01: 10:00:00 650 mg via ORAL
  Filled 2018-11-30: qty 2

## 2018-11-30 MED ORDER — HEPARIN BOLUS VIA INFUSION
5000.0000 [IU] | Freq: Once | INTRAVENOUS | Status: AC
  Administered 2018-11-30: 21:00:00 5000 [IU] via INTRAVENOUS
  Filled 2018-11-30: qty 5000

## 2018-11-30 MED ORDER — AMIODARONE LOAD VIA INFUSION
150.0000 mg | Freq: Once | INTRAVENOUS | Status: AC
  Administered 2018-11-30: 11:00:00 150 mg via INTRAVENOUS
  Filled 2018-11-30: qty 83.34

## 2018-11-30 MED ORDER — ZOLPIDEM TARTRATE 5 MG PO TABS
5.0000 mg | ORAL_TABLET | Freq: Every evening | ORAL | Status: DC | PRN
  Administered 2018-12-02 – 2018-12-06 (×6): 5 mg via ORAL
  Filled 2018-11-30 (×6): qty 1

## 2018-11-30 MED ORDER — ESCITALOPRAM OXALATE 10 MG PO TABS
5.0000 mg | ORAL_TABLET | Freq: Every day | ORAL | Status: DC
  Filled 2018-11-30 (×2): qty 0.5

## 2018-11-30 MED ORDER — DIGOXIN 250 MCG PO TABS
0.2500 mg | ORAL_TABLET | Freq: Once | ORAL | Status: AC
  Administered 2018-11-30: 0.25 mg via ORAL
  Filled 2018-11-30: qty 1

## 2018-11-30 MED ORDER — PROMETHAZINE HCL 25 MG/ML IJ SOLN: 12.5000 mg | Freq: Four times a day (QID) | INTRAMUSCULAR | Status: DC | PRN

## 2018-11-30 MED ORDER — HEPARIN (PORCINE) 25000 UT/250ML-% IV SOLN
1450.0000 [IU]/h | INTRAVENOUS | Status: DC
  Administered 2018-11-30: 21:00:00 1450 [IU]/h via INTRAVENOUS
  Filled 2018-11-30: qty 250

## 2018-11-30 MED ORDER — POTASSIUM CHLORIDE CRYS ER 20 MEQ PO TBCR
20.0000 meq | EXTENDED_RELEASE_TABLET | Freq: Two times a day (BID) | ORAL | Status: DC
  Administered 2018-11-30: 16:00:00 20 meq via ORAL
  Filled 2018-11-30: qty 1

## 2018-11-30 MED ORDER — CHLORHEXIDINE GLUCONATE CLOTH 2 % EX PADS
6.0000 | MEDICATED_PAD | Freq: Every day | CUTANEOUS | Status: DC
  Administered 2018-11-30: 11:00:00 6 via TOPICAL

## 2018-11-30 MED ORDER — PROMETHAZINE HCL 25 MG/ML IJ SOLN
6.2500 mg | INTRAMUSCULAR | Status: AC
  Administered 2018-11-30: 20:00:00 6.25 mg via INTRAVENOUS

## 2018-11-30 MED ORDER — FUROSEMIDE 10 MG/ML IJ SOLN
80.0000 mg | Freq: Two times a day (BID) | INTRAMUSCULAR | Status: DC
  Administered 2018-11-30 – 2018-12-04 (×10): 80 mg via INTRAVENOUS
  Filled 2018-11-30 (×11): qty 8

## 2018-11-30 MED ORDER — BISOPROLOL FUMARATE 5 MG PO TABS
2.5000 mg | ORAL_TABLET | Freq: Every day | ORAL | Status: DC
  Filled 2018-11-30 (×2): qty 0.5

## 2018-11-30 MED ORDER — MILRINONE LACTATE IN DEXTROSE 20-5 MG/100ML-% IV SOLN
0.1250 ug/kg/min | INTRAVENOUS | Status: DC
  Administered 2018-11-30 – 2018-12-03 (×5): 0.25 ug/kg/min via INTRAVENOUS
  Administered 2018-12-04: 22:00:00 0.125 ug/kg/min via INTRAVENOUS
  Administered 2018-12-04: 0.25 ug/kg/min via INTRAVENOUS
  Administered 2018-12-05: 0.125 ug/kg/min via INTRAVENOUS
  Filled 2018-11-30 (×12): qty 100

## 2018-11-30 MED ORDER — AMIODARONE HCL IN DEXTROSE 360-4.14 MG/200ML-% IV SOLN
60.0000 mg/h | INTRAVENOUS | Status: DC
  Administered 2018-11-30 (×2): 60 mg/h via INTRAVENOUS
  Filled 2018-11-30: qty 200

## 2018-11-30 MED ORDER — BISOPROLOL FUMARATE 5 MG PO TABS
1.2500 mg | ORAL_TABLET | Freq: Every day | ORAL | Status: DC
  Administered 2018-11-30: 18:00:00 2.5 mg via ORAL

## 2018-11-30 NOTE — Progress Notes (Signed)
*  PRELIMINARY RESULTS* Echocardiogram 2D Echocardiogram has been performed.  Sherrie Sport 11/30/2018, 2:44 PM

## 2018-11-30 NOTE — Progress Notes (Signed)
[place] for Electrolyte Monitoring and Replacement   Recent Labs: Potassium (mmol/L)  Date Value  11/30/2018 3.5   Magnesium (mg/dL)  Date Value  08/10/2018 2.2   Calcium (mg/dL)  Date Value  11/30/2018 8.6 (L)   Albumin (g/dL)  Date Value  11/30/2018 3.6   Sodium (mmol/L)  Date Value  11/30/2018 134 (L)     Assessment: 53 year old male with afib on Eliquis PTA. Admitted from [place] office. Patient to start on Lasix 80 mg IV BID per recommendation from Cardiology. Patient normally takes potassium supplement at home with his Lasix. Pharmacy to monitor electrolytes.  Goal of Therapy:  Electrolytes WNL  Plan:  Will give patient potassium 20 mEq BID to start with as he will be on Lasix 80 mg IV BID. Patient may require an increase in potassium supplement if continues on Lasix.  Tawnya Crook ,PharmD Clinical Pharmacist 11/30/2018 1:43 PM

## 2018-11-30 NOTE — Progress Notes (Signed)
Cardiology Consult    Patient ID: Kamarie Palma MRN: [id number], DOB/AGE: 1965/08/07   Admit date: 11/30/2018 Date of Consult: 11/30/2018  Primary Physician: Patient, [name] Primary Cardiologist: Yvonne Kendall, MD Requesting Provider: Joaquim Lai, MD  Patient Profile    Raine Elsass is a 53 y.o. male with a history of HFrEF, persistent atrial fibrillation status post recent cardioversion, prior left atrial appendage thrombus, questionable history of hypertrophic cardiomyopathy, moderate pericardial effusion, morbid obesity, and acute stress reaction who is being seen today for the evaluation of recurrent atrial fibrillation with rapid ventricular response and heart failure at the request of Dr. Cherlynn Kaiser.  Past Medical History   Past Medical History:  Diagnosis Date  . HFrEF (heart failure with reduced ejection fraction) (HCC)   . Persistent atrial fibrillation (HCC)   . 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 CARDIOVERSION N/A 08/08/2018   Procedure: CARDIOVERSION;  Surgeon: Antonieta Iba, MD;  Location: [facility];  Service: Cardiovascular;  Laterality: N/A;  . CARDIOVERSION N/A 11/20/2018   Procedure: CARDIOVERSION;  Surgeon: Yvonne Kendall, MD;  Location: [facility];  Service: Cardiovascular;  Laterality: N/A;  . CARDIOVERSION N/A 11/20/2018   Procedure: CARDIOVERSION;  Surgeon: Yvonne Kendall, MD;  Location: [facility];  Service: Cardiovascular;  Laterality: N/A;  . TEE WITHOUT CARDIOVERSION N/A 08/08/2018   Procedure: TRANSESOPHAGEAL ECHOCARDIOGRAM (TEE);  Surgeon: Antonieta Iba, MD;  Location: [facility];  Service: Cardiovascular;  Laterality: N/A;  . TEE WITHOUT CARDIOVERSION N/A 11/20/2018   Procedure: TRANSESOPHAGEAL ECHOCARDIOGRAM (TEE);  Surgeon: Yvonne Kendall, MD;  Location: [facility];  Service: Cardiovascular;  Laterality: N/A;     Allergies  No Known Allergies   History of Present Illness    **Mr. Milstein is a 53 y.o. year-old male with history of persistent atrial fibrillation, HFrEF with questionable history of hypertrophic cardiomyopathy, and acute stress reaction, who presents for follow-up of atrial fibrillation and heart failure.  He was last seen in our office in early September, at which time he remained in atrial fibrillation.  He did not have any significant edema at that time and was using furosemide only once or twice a week.  He underwent TEE 10 days ago, which showed interval resolution of left atrial thrombus with anticoagulation.  Cardioversion was performed with restoration of sinus rhythm.  LVEF was noted to be severely reduced on TEE.  Moderate pericardial effusion was also present.  Furosemide has subsequently been escalated to 40 mg twice daily.  Today, Mr. Leonetti presented to Dr. Okey Dupre for acute evaluation of worsening edema.  Despite escalation of furosemide to 40 mg twice daily (he has also tried taking 80 mg daily on occasion), he has not had any significant improvement in his urine output.  He notes progressive edema and weight gain.  He also has worsening shortness of breath and chronic orthopnea.  He denies chest pain.  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  He did not check his rhythm yesterday but believes that he may have reverted back to atrial fibrillation then.  He denies palpitations or lightheadedness.  He has been compliant with his medications, including apixaban and bisoprolol.  Inpatient Medications    . bisoprolol  2.5 mg Oral Daily  . Chlorhexidine Gluconate Cloth  6 each Topical Daily  . escitalopram  5 mg Oral Daily  . furosemide  80 mg Intravenous BID    Family History    Family History  Problem Relation Age of Onset  . Emphysema Mother   . Cancer Father    He indicated that his mother is deceased. He indicated that his father is deceased.   Social  History    Social History   Socioeconomic History  . Marital status: Widowed    Spouse name: Not on file  . Number of children: Not on file  . Years of education: Not on file  . Highest education level: Not on file  Occupational History  . Not on file  Social Needs  . Financial resource strain: Not on file  . Food insecurity    Worry: Not on file    Inability: Not on file  . Transportation needs    Medical: Not on file    Non-medical: Not on file  Tobacco Use  . Smoking status: Never Smoker  . Smokeless tobacco: Never Used  Substance and Sexual Activity  . Alcohol use: Never    Frequency: Never  . Drug use: Never  . Sexual activity: Not on file  Lifestyle  . Physical activity    Days per week: Not on file    Minutes per session: Not on file  . Stress: Not on file  Relationships  . Social Herbalist on phone: Not on file    Gets together: Not on file    Attends religious service: Not on file    Active member of club or organization: Not on file    Attends meetings of clubs or organizations: Not on file    Relationship status: Not on file  . Intimate partner violence    Fear of current or ex partner: Not on file    Emotionally abused: Not on file    Physically abused: Not on file    Forced sexual activity: Not on file  Other Topics Concern  . Not on file  Social History Narrative  . Not on file     Review of Systems    General:  No chills, fever, night sweats or weight changes.  Cardiovascular:  No chest pain, +++ dyspnea on exertion, +++ edema, +++ orthopnea, +++ palpitations, no paroxysmal nocturnal dyspnea. Dermatological: No rash, lesions/masses Respiratory: No cough, +++ dyspnea Urologic: No hematuria, dysuria Abdominal:   No nausea, vomiting, diarrhea, bright red blood per rectum, melena, or hematemesis Neurologic:  No visual changes, wkns, changes in mental status. All other systems reviewed and are otherwise negative except as noted above.   Physical Exam    Blood pressure 95/84, pulse (!) 32, temperature 97.7 F (36.5 C), temperature source Oral, resp. rate 16, height 5\' 8"  (1.727 m), weight 122.4 kg, SpO2 96 %.  General: Pleasant, NAD Psych: Normal affect. Neuro: Alert and oriented X 3. Moves all extremities spontaneously. HEENT: Normal  Neck: Supple without bruits.  JVP difficult to gauge secondary to body habitus but appears to be elevated at or above the angle of the mandible. Lungs:  Resp regular and unlabored, diminished breath sounds at bilateral bases. Heart: Tachycardic, irregularly irregular.  No s3, s4, or murmurs. Abdomen: Obese and firm.  Nontender.  BS + x 4.  Extremities: No clubbing, cyanosis.  3+ bilateral lower extremity edema to the calves. DP/PT/Radials 2+ and equal bilaterally.  Labs    Cardiac Enzymes Recent Labs  Lab 11/30/18 1052  TROPONINIHS 29*      Lab Results  Component Value Date  WBC 8.6 11/30/2018   HGB 15.1 11/30/2018   HCT 46.4 11/30/2018   MCV 92.8 11/30/2018   PLT 208 11/30/2018    Recent Labs  Lab 11/30/18 1048  NA 134*  K 3.5  CL 99  CO2 20*  BUN 14  CREATININE 1.34*  CALCIUM 8.6*  PROT 7.0  BILITOT 2.4*  ALKPHOS 87  ALT 24  AST 33  GLUCOSE 122*     Radiology Studies    Dg Chest Port 1 View  Result Date: 11/30/2018 CLINICAL DATA:  Shortness of breath. EXAM: PORTABLE CHEST 1 VIEW COMPARISON:  Chest x-ray dated August 06, 2018. FINDINGS: Stable cardiomegaly. Normal pulmonary vascularity. No focal consolidation, pleural effusion, or pneumothorax. No acute osseous abnormality. IMPRESSION: 1. Cardiomegaly.  No active disease. Electronically Signed   By: Obie Dredge M.D.   On: 11/30/2018 11:37    ECG & Cardiac Imaging    Atrial fibrillation with rapid ventricular response-92, low voltage, poor R wave progression - personally reviewed.  Assessment & Plan    1. Acute on chronic systolic heart failure: Mr. Mallard appears significantly volume overloaded and  has gained 17 pounds over the last 6 weeks.  He also endorses worsening edema and some exertional dyspnea/orthopnea.  Unfortunately, he did not experience any augmentation of his urine output with dose escalation of furosemide.  Given these findings as well as ongoing atrial fibrillation with rapid ventricular response, he was directly admitted from the office for diuresis and rate/rhythm mgmt.  Cont furosemide 80 mg IV twice daily and titration to achieve a urine output of at least net -2 L per 24 hours.  Continue current dose of bisoprolol 2.5 mg daily.  2. Persistent atrial fibrillation: Patient underwent successful TEE guided cardioversion 10 days ago believes that he may have gone back into atrial fibrillation yesterday.  Ventricular rate is poorly controlled at this time.  I recommend initiation of an amiodarone infusion for rate control as well as to hopefully facilitate maintenance of sinus rhythm in the future.  I would be hesitant to escalate his beta-blockade further at this time in the setting of acute decompensated heart failure.  Nondihydropyridine calcium channel blocker should also be avoided in the setting of severely reduced atrial fibrillation.  Short-term course of digoxin may be helpful, if renal function allows, though Mr. Dischler did not tolerate this long-term.  In the meantime, I think it would be best to hold apixaban and initiate a heparin infusion in case intervention (particularly pericardiocentesis) is needed.  3. Pericardial effusion: Moderate pericardial effusion noted on recent transesophageal echocardiogram.  I recommend obtaining a limited echo to reassess the effusion.  I suspect it is related to longstanding heart failure and will hopefully improve with diuresis.  However, if the effusion is large and there is evidence of tamponade physiology, pericardiocentesis may need to be considered down the road.  Signed, Nicolasa Ducking, NP 11/30/2018, 11:59 AM  For  questions or updates, please contact   Please consult [email or web address] for contact info under Cardiology/STEMI.

## 2018-11-30 NOTE — Patient Instructions (Addendum)
Medication Instructions:  - no changes  *If you need a refill on your cardiac medications before your next appointment, please call your pharmacy*  Lab Work: - none ordered  If you have labs (blood work) drawn today and your tests are completely normal, you will receive your results only by: Marland Kitchen MyChart Message (if you have MyChart) OR . A paper copy in the mail If you have any lab test that is abnormal or we need to change your treatment, we will call you to review the results.  Testing/Procedures: - none ordered  Follow-Up: At [facility], you and your health needs are our priority.  As part of our continuing mission to provide you with exceptional heart care, we have created designated Provider Care Teams.  These Care Teams include your primary Cardiologist (physician) and Advanced Practice Providers (APPs -  Physician Assistants and Nurse Practitioners) who all work together to provide you with the care you need, when you need it.  Your next appointment:   pending hospital discharge  The format for your next appointment:   n/a  Provider:   n/a  Other Instructions  - You are being admitted to [facility] step down/ ICU unit today- Room 17

## 2018-11-30 NOTE — Telephone Encounter (Signed)
Patient seen by Dr. Saunders Revel today 11/30/18 @ 8:20am.

## 2018-11-30 NOTE — Progress Notes (Signed)
[facility]  Date: 11/30/18 Time: 8:56 PM  I was contacted by the patient's RN regarding continued a-fib with RVR and the patient complaining of nausea and dizziness despite receiving ondansetron and promethazine.  I am concerned that his symptoms are reflective of low-output heart failure.  His urine output has been modest with furosemide 80 mg BID and creatinine has increased further since diuresis was initiated.  Digoxin 0.25 mg and bisoprolol 2.5 mg were given earlier today.  Kenneth Ferguson remains on an amiodarone infusion.  Echo earlier today showed LVEF 25-30%.  Though no pericardial effusion was reported on, there appears to be a small to moderate, predominantly posterior, pericardial effusion.  I do not believe that the effusion is the primary cause of his symptoms.  We will initiate milrinone 0.25 mcg/kg/min for inotropic support, though we will need to be very mindful of the patient's heart rate.  He will likely need cardioversion this admission, though I favor optimizing his HF status first unless he becomes unstable, as this will increase the likelihood that he maintains sinus rhythm.  Nelva Bush, MD [facility] Pager: [phone]

## 2018-11-30 NOTE — Progress Notes (Signed)
[facility] for heparin Indication: atrial fibrillation  No Known Allergies  Patient Measurements: Height: 5\' 8"  (172.7 cm) Weight: 269 lb 13.5 oz (122.4 kg) IBW/kg (Calculated) : 68.4 Heparin Dosing Weight: 96.6 kg  Vital Signs: Temp: 97.7 F (36.5 C) (10/23 1000) Temp Source: Oral (10/23 1000) BP: 110/84 (10/23 1000) Pulse Rate: 92 (10/23 1000)  Labs: No results for input(s): HGB, HCT, PLT, APTT, LABPROT, INR, HEPARINUNFRC, HEPRLOWMOCWT, CREATININE, CKTOTAL, CKMB, TROPONINIHS in the last 72 hours.  Estimated Creatinine Clearance: 96.8 mL/min (by C-G formula based on SCr of 1.11 mg/dL).   Medical History: Past Medical History:  Diagnosis Date  . HFrEF (heart failure with reduced ejection fraction) ([place])   . Persistent atrial fibrillation ([place])   . Thrombus of left atrial appendage without antecedent myocardial infarction     Assessment: 53 year old male with afib on Eliquis PTA. Patient admitted from [place] office for worsening SOB, progressive edema and weight gain. Per Cardiologist's recommendation, will hold Eliquis at this time and start heparin drip in case procedure is necessary. Pharmacy consulted for heparin drip management. Patient reports last dose of Eliquis 10/23 ~ 0800.  Goal of Therapy:  Heparin level 0.3-0.7 units/ml aPTT 66-102 seconds Monitor platelets by anticoagulation protocol: Yes   Plan:  Baseline labs have been ordered. With last dose of Eliquis at 0800, will defer starting heparin drip until approximately 2000. Will follow APTTs until correlation with HL. Heparin 5000 unit bolus followed by heparin drip at 1450 units/hr. APTT 10/24 at 0200. CBC with morning labs. Will defer HL until 10/25 am.  Tawnya Crook, PharmD 11/30/2018,10:31 AM

## 2018-11-30 NOTE — Progress Notes (Signed)
Due to multiple drips infusing and routine labs that need to be drawn for this patient, placement of a PICC line was ordered. However, upon speaking to the patient about these orders as well as benefits of the PICC line, the patient refused placement. Orders for placement, as well as CVP monitoring discontinued for now. Patient currently has two peripheral IVs. Will continue to monitor.   Cameron Ali, RN

## 2018-11-30 NOTE — H&P (Signed)
Kenneth Ferguson    MR#:  [id number]  DATE OF BIRTH:  January 01, 1966  DATE OF ADMISSION:  11/30/2018  PRIMARY CARE PHYSICIAN: DR. Harrell Gave End  REQUESTING/REFERRING PHYSICIAN: Dr. Harrell Gave End  CHIEF COMPLAINT:  No chief complaint on file. Shortness of breath, lower extremity edema  HISTORY OF PRESENT ILLNESS:  Kenneth Ferguson  is a 53 y.o. male with a known history of systolic congestive heart failure, chronic atrial fibrillation, history of left atrial thrombus on anticoagulation, status post TEE with cardioversion, history of pericardial effusion who presents to the hospital due to shortness of breath, worsening lower extremity edema and noted to be in atrial fibrillation with rapid ventricular response.  Patient was directly admitted from his cardiologist office as he presented there complaining of worsening lower extremity edema about a 20 pound weight gain over the past week, and feeling like his heart rate is back in atrial fibrillation.  Patient says he was in his usual state of health and over the past 2 days he has developed the above symptoms and went to see his cardiologist.  At the cardiologist office patient was noted to be in A. fib with RVR with heart rates in the 180s to 190s.  He was also noted to be volume overloaded and congestive heart failure.  He was sent over to the hospital for further management.  Patient denies any fevers, chills, cough, recent sick contacts or travel history.  Patient's COVID-19 test is pending.  Patient does admit to some two-pillow orthopnea, paroxysmal nocturnal dyspnea, but no other associated symptoms.  PAST MEDICAL HISTORY:   Past Medical History:  Diagnosis Date  . HFrEF (heart failure with reduced ejection fraction) ([place])   . Persistent atrial fibrillation ([place])   . Thrombus of left atrial appendage without antecedent myocardial infarction     PAST SURGICAL HISTORY:   Past Surgical History:  Procedure Laterality Date  . CARDIOVERSION N/A 08/08/2018   Procedure: CARDIOVERSION;  Surgeon: Minna Merritts, MD;  Location: [facility] ORS;  Service: Cardiovascular;  Laterality: N/A;  . CARDIOVERSION N/A 11/20/2018   Procedure: CARDIOVERSION;  Surgeon: Nelva Bush, MD;  Location: [place] [facility];  Service: Cardiovascular;  Laterality: N/A;  . CARDIOVERSION N/A 11/20/2018   Procedure: CARDIOVERSION;  Surgeon: Nelva Bush, MD;  Location: [facility] OR[facility];  Service: Cardiovascular;  Laterality: N/A;  . TEE WITHOUT CARDIOVERSION N/A 08/08/2018   Procedure: TRANSESOPHAGEAL ECHOCARDIOGRAM (TEE);  Surgeon: Minna Merritts, MD;  Location: [facility] OR[facility];  Service: Cardiovascular;  Laterality: N/A;  . TEE WITHOUT CARDIOVERSION N/A 11/20/2018   Procedure: TRANSESOPHAGEAL ECHOCARDIOGRAM (TEE);  Surgeon: Nelva Bush, MD;  Location: [facility] OR[facility];  Service: Cardiovascular;  Laterality: N/A;    SOCIAL HISTORY:   Social History   Tobacco Use  . Smoking status: Never Smoker  . Smokeless tobacco: Never Used  Substance Use Topics  . Alcohol use: Never    Frequency: Never    FAMILY HISTORY:   Family History  Problem Relation Age of Onset  . Emphysema Mother   . Cancer Father     DRUG ALLERGIES:  No Known Allergies  REVIEW OF SYSTEMS:   Review of Systems  Constitutional: Negative for fever and weight loss.  HENT: Negative for congestion, nosebleeds and tinnitus.   Eyes: Negative for blurred vision, double vision and redness.  Respiratory: Positive for shortness of breath. Negative for cough and hemoptysis.   Cardiovascular: Positive for orthopnea, leg swelling and PND. Negative for chest  pain.  Gastrointestinal: Negative for abdominal pain, diarrhea, melena, nausea and vomiting.  Genitourinary: Negative for dysuria, hematuria and urgency.  Musculoskeletal: Negative for falls and joint pain.  Neurological: Negative for dizziness, tingling, sensory change, focal weakness,  seizures, weakness and headaches.  Endo/Heme/Allergies: Negative for polydipsia. Does not bruise/bleed easily.  Psychiatric/Behavioral: Negative for depression and memory loss. The patient is not nervous/anxious.     MEDICATIONS AT HOME:   Prior to Admission medications   Medication Sig Start Date End Date Taking? Authorizing Provider  apixaban (ELIQUIS) 5 MG TABS tablet Take 1 tablet (5 mg total) by mouth 2 (two) times daily. 10/10/18   Creig Hines, NP  bisoprolol (ZEBETA) 5 MG tablet Take 0.5 tablets (2.5 mg total) by mouth daily. 11/20/18   End, Cristal Deer, MD  furosemide (LASIX) 40 MG tablet Take 1 tablet (40 mg total) by mouth daily. May take 40 mg two times a day if having increased swelling. 11/22/18   End, Cristal Deer, MD  potassium chloride SA (KLOR-CON) 20 MEQ tablet Take 1 tablet (20 mEq total) by mouth daily. Increase to 20 mEq two times a day if taking furosemide 2 times a day. 11/22/18   End, Cristal Deer, MD      VITAL SIGNS:  Blood pressure (!) 113/100, pulse (!) 111, temperature 97.7 F (36.5 C), temperature source Oral, resp. rate (!) 21, height 5\' 8"  (1.727 m), weight 122.4 kg, SpO2 95 %.  PHYSICAL EXAMINATION:  Physical Exam  GENERAL:  53 y.o.-year-old obese patient lying in the bed in no acute distress.  EYES: Pupils equal, round, reactive to light and accommodation. No scleral icterus. Extraocular muscles intact.  HEENT: Head atraumatic, normocephalic. Oropharynx and nasopharynx clear. No oropharyngeal erythema, moist oral mucosa  NECK:  Supple, + jugular venous distention. No thyroid enlargement, no tenderness.  LUNGS: Normal breath sounds bilaterally, no wheezing, bibasilar rales, No rhonchi. No use of accessory muscles of respiration.  CARDIOVASCULAR: S1, S2 RRR. No murmurs, rubs, gallops, clicks.  ABDOMEN: Soft, nontender, nondistended. Bowel sounds present. No organomegaly or mass.  EXTREMITIES: + 2 edema b/l, No cyanosis, or clubbing. + 2 pedal &  radial pulses b/l.   NEUROLOGIC: Cranial nerves II through XII are intact. No focal Motor or sensory deficits appreciated b/l.  PSYCHIATRIC: The patient is alert and oriented x 3.  SKIN: No obvious rash, lesion, or ulcer.   LABORATORY PANEL:   CBC Recent Labs  Lab 11/30/18 1048  WBC 8.6  HGB 15.1  HCT 46.4  PLT 208   ------------------------------------------------------------------------------------------------------------------  Chemistries  Recent Labs  Lab 11/30/18 1048  NA 134*  K 3.5  CL 99  CO2 20*  GLUCOSE 122*  BUN 14  CREATININE 1.34*  CALCIUM 8.6*  AST 33  ALT 24  ALKPHOS 87  BILITOT 2.4*   ------------------------------------------------------------------------------------------------------------------  Cardiac Enzymes No results for input(s): TROPONINI in the last 168 hours. ------------------------------------------------------------------------------------------------------------------  RADIOLOGY:  Dg Chest Port 1 View  Result Date: 11/30/2018 CLINICAL DATA:  Shortness of breath. EXAM: PORTABLE CHEST 1 VIEW COMPARISON:  Chest x-ray dated August 06, 2018. FINDINGS: Stable cardiomegaly. Normal pulmonary vascularity. No focal consolidation, pleural effusion, or pneumothorax. No acute osseous abnormality. IMPRESSION: 1. Cardiomegaly.  No active disease. Electronically Signed   By: Obie Dredge M.D.   On: 11/30/2018 11:37     IMPRESSION AND PLAN:   53 y.o. male with a known history of systolic congestive heart failure, chronic atrial fibrillation, history of left atrial thrombus on anticoagulation, status post TEE with cardioversion,  history of pericardial effusion who presents to the hospital due to shortness of breath, worsening lower extremity edema and noted to be in atrial fibrillation with rapid ventricular response.  1.  Atrial fibrillation with rapid ventricular response-patient has a history of persistent A. fib but now presents with  uncontrolled heart rates. -Admitted to stepdown level of care, started on amiodarone drip.  If does not chemically convert cardiology plans on doing a cardioversion next week. -We will get limited echocardiogram, continue rate control as per cardiology.  2.  CHF-acute on chronic systolic congestive heart failure.  Patient has underlying cardiomyopathy EF of 20 to 25%. -Continue aggressive diuresis with IV Lasix 80 mg twice daily, follow I's and O's and daily weights. -Continue rate control of A. fib as mentioned above.  Cardiology will continue to monitor and follow.  Limited echocardiogram ordered.  3.  History of pericardial effusion-no evidence of tamponade physiology presently. -We will get limited echocardiogram, further care as per cardiology.  4.  Essential hypertension-continue bisoprolol.  5.  Depression-continue Lexapro.  All the records are reviewed and case discussed with ED provider. Management plans discussed with the patient, family and they are in agreement.  CODE STATUS: Full code  TOTAL TIME TAKING CARE OF THIS PATIENT: 45 minutes.    Houston Siren M.D on 11/30/2018 at 12:27 PM  Between 7am to 6pm - Pager - [phone]  After 6pm go to [email or web address] - password [email or web address]  [facility]  Office  [phone]  CC: Primary care physician; Patient, [name]

## 2018-11-30 NOTE — Progress Notes (Signed)
Follow-up Outpatient Visit Date: 11/30/2018  Primary Care Provider: Patient, [name] No address on file  Chief Complaint: Shortness of breath  HPI:  Kenneth Ferguson is a 53 y.o. year-old male with history of persistent atrial fibrillation, HFrEF with questionable history of hypertrophic cardiomyopathy, and acute stress reaction, who presents for follow-up of atrial fibrillation and heart failure.  He was last seen in our office in early September, at which time he remained in atrial fibrillation.  He did not have any significant edema at that time and was using furosemide only once or twice a week.  He underwent TEE 10 days ago, which showed interval resolution of left atrial thrombus with anticoagulation.  Cardioversion was performed with restoration of sinus rhythm.  LVEF was noted to be severely reduced on TEE.  Moderate pericardial effusion was also present.  Furosemide has subsequently been escalated to 40 mg twice daily.  Today, Kenneth Ferguson presents for acute evaluation of worsening edema.  Despite escalation of furosemide to 40 mg twice daily (he has also tried taking 80 mg daily on occasion), he has not had any significant improvement in his urine output.  He notes progressive edema and weight gain.  He also has worsening shortness of breath and chronic orthopnea.  He denies chest pain.  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  He did not check his rhythm yesterday but believes that he may have reverted back to atrial fibrillation then.  He denies palpitations or lightheadedness.  He has been compliant with his medications, including apixaban and bisoprolol.  --------------------------------------------------------------------------------------------------  Past Medical History:  Diagnosis Date  . HFrEF (heart failure with reduced ejection fraction) ([place])   . Persistent atrial fibrillation ([place])   . Thrombus of left atrial appendage  without antecedent myocardial infarction    Past Surgical History:  Procedure Laterality Date  . CARDIOVERSION N/A 08/08/2018   Procedure: CARDIOVERSION;  Surgeon: Minna Merritts, MD;  Location: [facility] OR[facility];  Service: Cardiovascular;  Laterality: N/A;  . CARDIOVERSION N/A 11/20/2018   Procedure: CARDIOVERSION;  Surgeon: Nelva Bush, MD;  Location: [facility];  Service: Cardiovascular;  Laterality: N/A;  . CARDIOVERSION N/A 11/20/2018   Procedure: CARDIOVERSION;  Surgeon: Nelva Bush, MD;  Location: [facility] OR[facility];  Service: Cardiovascular;  Laterality: N/A;  . TEE WITHOUT CARDIOVERSION N/A 08/08/2018   Procedure: TRANSESOPHAGEAL ECHOCARDIOGRAM (TEE);  Surgeon: Minna Merritts, MD;  Location: [facility] OR[facility];  Service: Cardiovascular;  Laterality: N/A;  . TEE WITHOUT CARDIOVERSION N/A 11/20/2018   Procedure: TRANSESOPHAGEAL ECHOCARDIOGRAM (TEE);  Surgeon: Nelva Bush, MD;  Location: [facility] OR[facility];  Service: Cardiovascular;  Laterality: N/A;    Current Meds  Medication Sig  . apixaban (ELIQUIS) 5 MG TABS tablet Take 1 tablet (5 mg total) by mouth 2 (two) times daily.  . bisoprolol (ZEBETA) 5 MG tablet Take 0.5 tablets (2.5 mg total) by mouth daily.  . furosemide (LASIX) 40 MG tablet Take 1 tablet (40 mg total) by mouth daily. May take 40 mg two times a day if having increased swelling.  . potassium chloride SA (KLOR-CON) 20 MEQ tablet Take 1 tablet (20 mEq total) by mouth daily. Increase to 20 mEq two times a day if taking furosemide 2 times a day.    Allergies: Patient has no known allergies.  Social History   Tobacco Use  . Smoking status: Never Smoker  . Smokeless tobacco: Never Used  Substance Use Topics  . Alcohol use:  Never    Frequency: Never  . Drug use: Never    Family History  Problem Relation Age of Onset  . Emphysema Mother   . Cancer Father     Review of Systems: A 12-system review of systems was performed and was negative except as noted in the HPI.   --------------------------------------------------------------------------------------------------  Physical Exam: BP (!) 120/92 (BP Location: Left Arm, Patient Position: Sitting, Cuff Size: Normal)   Pulse (!) 194   Ht 5\' 8"  (1.727 m)   Wt 278 lb (126.1 kg)   SpO2 98%   BMI 42.27 kg/m   General: Obese man, appearing anxious in the exam room. HEENT: No conjunctival pallor or scleral icterus.  Facemask in place. Neck: Supple without lymphadenopathy or thyromegaly.  JVP difficult to appreciate due to body habitus but it appears to be elevated at or above the angle of the mandible. Lungs: Mildly increased work of breathing.  Diminished breath sounds at both lung bases with otherwise fair air movement. Heart: Tachycardic and irregularly irregular.  No murmurs or rubs.  Unable to assess PMI due to body habitus. Abd: Bowel sounds present.  Abdomen is obese and firm.  No tenderness.  Unable to assess HSM due to body habitus. Ext: 3+ pitting edema to the proximal calves bilaterally. Skin: Warm and dry without rash.  EKG: Atrial fibrillation with rapid ventricular response (ventricular rate under 92 bpm), low voltage, and poor R wave progression.  Lab Results  Component Value Date   WBC 6.8 11/19/2018   HGB 14.6 11/19/2018   HCT 45.0 11/19/2018   MCV 91.6 11/19/2018   PLT 202 11/19/2018    Lab Results  Component Value Date   NA 134 (L) 11/19/2018   K 3.5 11/19/2018   CL 102 11/19/2018   CO2 27 11/19/2018   BUN 12 11/19/2018   CREATININE 1.11 11/19/2018   GLUCOSE 115 (H) 11/19/2018   ALT 24 08/06/2018    No results found for: CHOL, HDL, LDLCALC, LDLDIRECT, TRIG, CHOLHDL  --------------------------------------------------------------------------------------------------  ASSESSMENT AND PLAN: Acute on chronic systolic heart failure: Kenneth Ferguson appears significantly volume overloaded and has gained 17 pounds over the last 6 weeks.  He also endorses worsening edema and some  exertional dyspnea/orthopnea.  Unfortunately, he did not experience any augmentation of his urine output with dose escalation of furosemide.  Given these findings as well as ongoing atrial fibrillation with rapid ventricular response, I do not think that he will tolerate outpatient management.  We will plan to directly admit him to stepdown for IV diuresis and heart rate control.  I suggest starting with furosemide 80 mg IV twice daily and titration to achieve a urine output of at least net -2 L per 24 hours.  Continue current dose of bisoprolol 2.5 mg daily.  Persistent atrial fibrillation: Patient underwent successful TEE guided cardioversion 10 days ago believes that he may have gone back into atrial fibrillation yesterday.  Ventricular rate is poorly controlled at this time.  I think Kenneth Ferguson will need inpatient management.  I recommend initiation of an amiodarone infusion for rate control as well as to hopefully facilitate maintenance of sinus rhythm in the future.  I would be hesitant to escalate his beta-blockade further at this time in the setting of acute decompensated heart failure.  Nondihydropyridine calcium channel blocker should also be avoided in the setting of severely reduced atrial fibrillation.  Short-term course of digoxin may be helpful, if renal function allows, though Kenneth Ferguson did not tolerate this long-term.  In the meantime, I think it would be best to hold apixaban and initiate a heparin infusion in case intervention (particularly pericardiocentesis) is needed.  Pericardial effusion: Moderate pericardial effusion noted on recent transesophageal echocardiogram.  I recommend obtaining a limited echo to reassess the effusion.  I suspect it is related to longstanding heart failure and will hopefully improve with diuresis.  However, if the effusion is large and there is evidence of tamponade physiology, pericardiocentesis may need to be considered down the road.  Follow-up: To be  determined based on hospital course.  Yvonne Kendall, MD 11/30/2018 9:26 AM

## 2018-11-30 NOTE — H&P (Addendum)
Name: Burak Zerbe MRN: [id number] DOB: 20-Oct-1965     CONSULTATION DATE: 11/30/2018  REFERRING MD :  Dr. Saunders Revel  CHIEF COMPLAINT:  Atrial Fibrillation with RVR    HISTORY OF PRESENT ILLNESS:   Dj "Nicole Kindred" Halley is a 53 year old male who presented to his cardiologist, Dr. Saunders Revel, this morning for worsening edema of his extremities and shortness of breath for the past week. Pt has a history of HFrEF, persistent atrial fibrillation and morbid obesity. Patient was cardioverted approximately one week ago for his afib. After the cardioversion last week, pt reports he noticed more episodes of SOB with minimal exertion and laying flat as well as more swelling of his hands and feet. Pt had been taking 40 mg Lasix BID, but after symptoms persisted, he received permission from Dr. Saunders Revel to take 80 mg BID. Pt reports symptoms still persisted. This morning when he went to see Dr. Saunders Revel, pt was found to be in afib with RVR. Pt was transferred to ICU for further management.    No evidence or signs of infection at this time No fevers, chills, nausea, vomiting, diarrhea No evidence hemoptysis    PAST MEDICAL HISTORY :   has a past medical history of HFrEF (heart failure with reduced ejection fraction) ([place]), Persistent atrial fibrillation ([place]), and Thrombus of left atrial appendage without antecedent myocardial infarction.  has a past surgical history that includes Cardioversion (N/A, 08/08/2018); TEE without cardioversion (N/A, 08/08/2018); TEE without cardioversion (N/A, 11/20/2018); Cardioversion (N/A, 11/20/2018); and Cardioversion (N/A, 11/20/2018). Prior to Admission medications   Medication Sig Start Date End Date Taking? Authorizing Provider  apixaban (ELIQUIS) 5 MG TABS tablet Take 1 tablet (5 mg total) by mouth 2 (two) times daily. 10/10/18   Theora Gianotti, NP  bisoprolol (ZEBETA) 5 MG tablet Take 0.5 tablets (2.5 mg total) by mouth daily. 11/20/18   End, Harrell Gave, MD  furosemide (LASIX)  40 MG tablet Take 1 tablet (40 mg total) by mouth daily. May take 40 mg two times a day if having increased swelling. 11/22/18   End, Harrell Gave, MD  potassium chloride SA (KLOR-CON) 20 MEQ tablet Take 1 tablet (20 mEq total) by mouth daily. Increase to 20 mEq two times a day if taking furosemide 2 times a day. 11/22/18   End, Harrell Gave, MD   No Known Allergies  FAMILY HISTORY:  family history includes Cancer in his father; Emphysema in his mother. SOCIAL HISTORY:  reports that he has never smoked. He has never used smokeless tobacco. He reports that he does not drink alcohol or use drugs.  REVIEW OF SYSTEMS:   Review of Systems  Constitutional: Negative for chills and fever.       Recent weight gain  HENT: Negative for congestion and sore throat.   Eyes: Negative for blurred vision.  Respiratory: Positive for shortness of breath (upon exertion. None reported currently).   Cardiovascular: Positive for leg swelling. Negative for chest pain and palpitations.  Gastrointestinal: Positive for abdominal pain ("tightness" in abdomen. Has improved slightly since admission), nausea and vomiting.  Genitourinary: Negative for dysuria and urgency.       Noticed urine output had decreased  Neurological: Negative for dizziness, tingling, sensory change and headaches.  Psychiatric/Behavioral: Positive for depression.       Wife died unexpectedly in Jun 27, 2022. States things have been difficult since then   CBC    Component Value Date/Time   WBC 8.6 11/30/2018 1048   RBC 5.00 11/30/2018 1048   HGB 15.1  11/30/2018 1048   HCT 46.4 11/30/2018 1048   PLT 208 11/30/2018 1048   MCV 92.8 11/30/2018 1048   MCH 30.2 11/30/2018 1048   MCHC 32.5 11/30/2018 1048   RDW 15.9 (H) 11/30/2018 1048   LYMPHSABS 1.7 08/08/2018 0155   MONOABS 0.6 08/08/2018 0155   EOSABS 0.1 08/08/2018 0155   BASOSABS 0.0 08/08/2018 0155   BMP Latest Ref Rng & Units 11/30/2018 11/19/2018 08/21/2018  Glucose 70 - 99 mg/dL 785(Y)  850(Y) 99  BUN 6 - 20 mg/dL 14 12 14   Creatinine 0.61 - 1.24 mg/dL 7.74(J) 2.87 8.67(E)  Sodium 135 - 145 mmol/L 134(L) 134(L) 137  Potassium 3.5 - 5.1 mmol/L 3.5 3.5 4.0  Chloride 98 - 111 mmol/L 99 102 102  CO2 22 - 32 mmol/L 20(L) 27 25  Calcium 8.9 - 10.3 mg/dL 7.2(C) 9.4(B) 9.1      VITAL SIGNS: Temp:  [97.7 F (36.5 C)] 97.7 F (36.5 C) (10/23 1000) Pulse Rate:  [32-194] 111 (10/23 1200) Resp:  [16-21] 21 (10/23 1200) BP: (95-120)/(84-100) 113/100 (10/23 1200) SpO2:  [95 %-99 %] 95 % (10/23 1200) Weight:  [122.4 kg-126.1 kg] 122.4 kg (10/23 1000)   No intake/output data recorded. No intake/output data recorded.   SpO2: 95 %  Physical Exam  Constitutional: He is oriented to person, place, and time and well-developed, well-nourished, and in no distress. No distress.  HENT:  Head: Normocephalic and atraumatic.  Mouth/Throat: Oropharynx is clear and moist.  Eyes: Pupils are equal, round, and reactive to light. No scleral icterus.  Neck: Neck supple.  Cardiovascular:  Irregular rate and rhythm  Pulmonary/Chest: Effort normal and breath sounds normal.  Abdominal: He exhibits distension.  Musculoskeletal:        General: Edema present.  Neurological: He is alert and oriented to person, place, and time.  Skin: Skin is warm and dry.    I personally reviewed lab work that was obtained in last 24 hrs. CXR Independently reviewed- Cardiomegaly, no effusions, no infiltrates, ne edema  MEDICATIONS: I have reviewed all medications and confirmed regimen as documented   CULTURE RESULTS   Recent Results (from the past 240 hour(s))  MRSA PCR Screening     Status: None   Collection Time: 11/30/18 10:05 AM   Specimen: Nasopharyngeal  Result Value Ref Range Status   MRSA by PCR NEGATIVE NEGATIVE Final    Comment:        The GeneXpert MRSA Assay (FDA approved for NASAL specimens only), is one component of a comprehensive MRSA colonization surveillance program. It is not  intended to diagnose MRSA infection nor to guide or monitor treatment for MRSA infections. Performed at [facility], [address] [place], [place] [address]   SARS Coronavirus 2 by RT PCR (hospital order, performed in [facility] hospital lab)     Status: None   Collection Time: 11/30/18 11:17 AM  Result Value Ref Range Status   SARS Coronavirus 2 NEGATIVE NEGATIVE Final    Comment: (NOTE) If result is NEGATIVE SARS-CoV-2 target nucleic acids are NOT DETECTED. The SARS-CoV-2 RNA is generally detectable in upper and lower  respiratory specimens during the acute phase of infection. The lowest  concentration of SARS-CoV-2 viral copies this assay can detect is 250  copies / mL. A negative result does not preclude SARS-CoV-2 infection  and should not be used as the sole basis for treatment or other  patient management decisions.  A negative result may occur with  improper specimen collection /  handling, submission of specimen other  than nasopharyngeal swab, presence of viral mutation(s) within the  areas targeted by this assay, and inadequate number of viral copies  (<250 copies / mL). A negative result must be combined with clinical  observations, patient history, and epidemiological information. If result is POSITIVE SARS-CoV-2 target nucleic acids are DETECTED. The SARS-CoV-2 RNA is generally detectable in upper and lower  respiratory specimens dur ing the acute phase of infection.  Positive  results are indicative of active infection with SARS-CoV-2.  Clinical  correlation with patient history and other diagnostic information is  necessary to determine patient infection status.  Positive results do  not rule out bacterial infection or co-infection with other viruses. If result is PRESUMPTIVE POSTIVE SARS-CoV-2 nucleic acids MAY BE PRESENT.   A presumptive positive result was obtained on the submitted specimen  and confirmed on repeat testing.  While 2019 novel  coronavirus  (SARS-CoV-2) nucleic acids may be present in the submitted sample  additional confirmatory testing may be necessary for epidemiological  and / or clinical management purposes  to differentiate between  SARS-CoV-2 and other Sarbecovirus currently known to infect humans.  If clinically indicated additional testing with an alternate test  methodology [phone]) is advised. The SARS-CoV-2 RNA is generally  detectable in upper and lower respiratory sp ecimens during the acute  phase of infection. The expected result is Negative. Fact Sheet for Patients:  BoilerBrush.com.cy Fact Sheet for Healthcare Providers: https://pope.com/ This test is not yet approved or cleared by the [place] FDA and has been authorized for detection and/or diagnosis of SARS-CoV-2 by FDA under an Emergency Use Authorization (EUA).  This EUA will remain in effect (meaning this test can be used) for the duration of the COVID-19 declaration under Section 564(b)(1) of the Act, 21 U.S.C. section 360bbb-3(b)(1), unless the authorization is terminated or revoked sooner. Performed at [facility], [address] [place], [place] [address]           IMAGING    Dg Chest Port 1 View  Result Date: 11/30/2018 CLINICAL DATA:  Shortness of breath. EXAM: PORTABLE CHEST 1 VIEW COMPARISON:  Chest x-ray dated August 06, 2018. FINDINGS: Stable cardiomegaly. Normal pulmonary vascularity. No focal consolidation, pleural effusion, or pneumothorax. No acute osseous abnormality. IMPRESSION: 1. Cardiomegaly.  No active disease. Electronically Signed   By: Obie Dredge M.D.   On: 11/30/2018 11:37      ASSESSMENT AND PLAN SYNOPSIS  Atrial Fibrillation with RVR -Continue amiodarone -Monitor on EKG   -Consult with cardiology. Repeat TEE may be needed Patient is REFUSING HEPARIN INFUSION   Acute HF with rEF -oxygen as needed -Lasix 80 mg BID IV as  tolerated  -follow up cardiac enzymes as indicated -follow up cardiology recs -monitor urine output    CARDIAC -ICU monitoring  GI GI PROPHYLAXIS as indicated  NUTRITIONAL STATUS DIET--> Cardiac diet as tolerated  Constipation protocol as indicated   ENDO - will use ICU hypoglycemic\Hyperglycemia protocol if needed  Psych -start patient on lexapro for depressed affect  ELECTROLYTES -follow labs as needed -replace as needed -pharmacy consultation and following   DVT/GI PRX ordered TRANSFUSIONS AS NEEDED MONITOR FSBS ASSESS the need for LABS    Patient prognosis is guarded. Will need cardiology follow up.      Lucie Leather, M.D.  [facility]  Medical Director [facility] Medical Director [facility] Cardio-Pulmonary Department

## 2018-12-01 DIAGNOSIS — E876 Hypokalemia: Secondary | ICD-10-CM | POA: Diagnosis not present

## 2018-12-01 DIAGNOSIS — R7989 Other specified abnormal findings of blood chemistry: Secondary | ICD-10-CM | POA: Diagnosis not present

## 2018-12-01 DIAGNOSIS — I5023 Acute on chronic systolic (congestive) heart failure: Secondary | ICD-10-CM | POA: Diagnosis not present

## 2018-12-01 DIAGNOSIS — I4819 Other persistent atrial fibrillation: Principal | ICD-10-CM

## 2018-12-01 LAB — BASIC METABOLIC PANEL
Anion gap: 11 (ref 5–15)
BUN: 17 mg/dL (ref 6–20)
CO2: 24 mmol/L (ref 22–32)
Calcium: 8.5 mg/dL — ABNORMAL LOW (ref 8.9–10.3)
Chloride: 100 mmol/L (ref 98–111)
Creatinine, Ser: 1.57 mg/dL — ABNORMAL HIGH (ref 0.61–1.24)
GFR calc Af Amer: 57 mL/min — ABNORMAL LOW (ref >60)
GFR calc non Af Amer: 49 mL/min — ABNORMAL LOW (ref >60)
Glucose, Bld: 107 mg/dL — ABNORMAL HIGH (ref 70–99)
Potassium: 3.5 mmol/L (ref 3.5–5.1)
Sodium: 135 mmol/L (ref 135–145)

## 2018-12-01 LAB — PROCALCITONIN: Procalcitonin: <0.1 ng/mL

## 2018-12-01 LAB — CBC
HCT: 43.6 % (ref 39.0–52.0)
Hemoglobin: 14.2 g/dL (ref 13.0–17.0)
MCH: 30.3 pg (ref 26.0–34.0)
MCHC: 32.6 g/dL (ref 30.0–36.0)
MCV: 93 fL (ref 80.0–100.0)
Platelets: 218 10*3/uL (ref 150–400)
RBC: 4.69 MIL/uL (ref 4.22–5.81)
RDW: 16 % — ABNORMAL HIGH (ref 11.5–15.5)
WBC: 9.2 10*3/uL (ref 4.0–10.5)
nRBC: 0 % (ref 0.0–0.2)

## 2018-12-01 LAB — PHOSPHORUS: Phosphorus: 3.6 mg/dL (ref 2.5–4.6)

## 2018-12-01 LAB — APTT
aPTT: >160 seconds (ref 24–36)
aPTT: 112 seconds — ABNORMAL HIGH (ref 24–36)

## 2018-12-01 LAB — CREATININE, SERUM
Creatinine, Ser: 1.53 mg/dL — ABNORMAL HIGH (ref 0.61–1.24)
GFR calc Af Amer: 59 mL/min — ABNORMAL LOW (ref >60)
GFR calc non Af Amer: 51 mL/min — ABNORMAL LOW (ref >60)

## 2018-12-01 LAB — MAGNESIUM: Magnesium: 2.1 mg/dL (ref 1.7–2.4)

## 2018-12-01 MED ORDER — HEPARIN (PORCINE) 25000 UT/250ML-% IV SOLN
1250.0000 [IU]/h | INTRAVENOUS | Status: DC
  Administered 2018-12-01: 1250 [IU]/h via INTRAVENOUS
  Filled 2018-12-01: qty 250

## 2018-12-01 MED ORDER — SODIUM CHLORIDE 0.9 % IV SOLN
0.0000 ug/min | INTRAVENOUS | Status: DC
  Filled 2018-12-01: qty 1

## 2018-12-01 MED ORDER — POTASSIUM CHLORIDE CRYS ER 20 MEQ PO TBCR
20.0000 meq | EXTENDED_RELEASE_TABLET | Freq: Two times a day (BID) | ORAL | Status: AC
  Administered 2018-12-01 (×2): 20 meq via ORAL
  Filled 2018-12-01 (×2): qty 1

## 2018-12-01 MED ORDER — HEPARIN (PORCINE) 25000 UT/250ML-% IV SOLN: 1150.0000 [IU]/h | INTRAVENOUS | Status: DC

## 2018-12-01 MED ORDER — DIGOXIN 250 MCG PO TABS
0.2500 mg | ORAL_TABLET | Freq: Four times a day (QID) | ORAL | Status: AC
  Administered 2018-12-01 (×2): 0.25 mg via ORAL
  Filled 2018-12-01 (×2): qty 1

## 2018-12-01 NOTE — Progress Notes (Signed)
ANTICOAGULATION CONSULT NOTE  Pharmacy Consult for heparin Indication: atrial fibrillation  No Known Allergies  Patient Measurements: Height: 5\' 8"  (172.7 cm) Weight: 272 lb 4.3 oz (123.5 kg) IBW/kg (Calculated) : 68.4 Heparin Dosing Weight: 96.6 kg  Vital Signs: Temp: 97.7 F (36.5 C) (10/24 1228) Temp Source: Oral (10/24 1228) BP: 95/67 (10/24 1600) Pulse Rate: 90 (10/24 1600)  Labs: Recent Labs    11/30/18 1048  11/30/18 1254 11/30/18 1823 11/30/18 2024 11/30/18 2206 12/01/18 0507 12/01/18 1353 12/01/18 1618  HGB 15.1  --   --   --   --   --  14.2  --   --   HCT 46.4  --   --   --   --   --  43.6  --   --   PLT 208  --   --   --   --   --  218  --   --   APTT 35  --   --   --   --   --  >160*  --  112*  LABPROT 23.1*  --   --   --   --   --   --   --   --   INR 2.1*  --   --   --   --   --   --   --   --   HEPARINUNFRC >3.60*  --   --   --   --   --   --   --   --   CREATININE 1.34*  --   --  1.46*  --   --  1.57* 1.53*  --   TROPONINIHS  --    < > 31*  --  36* 30*  --   --   --    < > = values in this interval not displayed.    Estimated Creatinine Clearance: 70.6 mL/min (A) (by C-G formula based on SCr of 1.53 mg/dL (H)).   Medical History: Past Medical History:  Diagnosis Date  . HFrEF (heart failure with reduced ejection fraction) ([place])   . Persistent atrial fibrillation ([place])   . Thrombus of left atrial appendage without antecedent myocardial infarction     Assessment: 53 year old male with afib on Eliquis PTA. Patient admitted from [place] office for worsening SOB, progressive edema and weight gain. Per Cardiologist's recommendation, will hold Eliquis at this time and start heparin drip in case procedure is necessary. Pharmacy consulted for heparin drip management. Patient reports last dose of Eliquis 10/23 ~ 0800.  10/24 @1618  aPTT 112. Heparin infusion was restarted at 1230. Confirmed with nurse the infusion was stopped, earlier, as  scheduled.   Goal of Therapy:  Heparin level 0.3-0.7 units/ml aPTT 66-102 seconds Monitor platelets by anticoagulation protocol: Yes   Plan:  1. Will HOLD infusion for 1 hour.   2. Decrease infusion to 1050 units/hr. Infusion will start at 1800, floor nurse was notified. Will check aPTT in 6 hours.   3.CBC with morning labs. Will defer HL until 10/25 am.     Rowland Lathe, [facility] 12/01/2018,4:49 PM

## 2018-12-01 NOTE — Progress Notes (Signed)
   Per primary rounding cardiologist, oral digoxin 0.25mg  q6h x2 ordered for rate control. Close eye on renal function recommended and will recheck serum creatinine.    Signed, Arvil Chaco, PA-C 12/01/2018, 10:32 AM Pager [phone]

## 2018-12-01 NOTE — Progress Notes (Signed)
[facility] for Electrolyte Monitoring and Replacement   Recent Labs: Potassium (mmol/L)  Date Value  12/01/2018 3.5   Magnesium (mg/dL)  Date Value  12/01/2018 2.1   Calcium (mg/dL)  Date Value  12/01/2018 8.5 (L)   Albumin (g/dL)  Date Value  11/30/2018 3.6   Phosphorus (mg/dL)  Date Value  12/01/2018 3.6   Sodium (mmol/L)  Date Value  12/01/2018 135     Assessment: 53 year old male with afib on Eliquis PTA. Admitted from [place] office. Patient to start on Lasix 80 mg IV BID per recommendation from Cardiology. Patient normally takes potassium supplement at home with his Lasix. Pharmacy to monitor electrolytes.  Goal of Therapy:  Electrolytes WNL  Plan:  Will give patient potassium 20 mEq BID today. Continue Lasix 80 mg IV BID. Patient may require an increase in potassium supplement if continues on Lasix.  Olivia Canter [facility] Clinical Pharmacist 12/01/2018 9:49 AM

## 2018-12-01 NOTE — Progress Notes (Signed)
Progress Note  Patient Name: Kenneth Ferguson Date of Encounter: 12/01/2018  Primary Cardiologist: Yvonne Kendall, MD   Subjective   He denies any chest pain, palpitations, or feeling of racing heart rate today.  He denies any shortness of breath at rest at the present moment.  His orthopnea improved overnight, as he was able to lay flat and on his side.  He also feels as if his abdominal distention has improved overnight, as well as his appetitie.  He reports that he was started on IV potassium overnight, which caused him severe muscle cramping and oral potassium has been ordered for today.  Overall, he feels much improved.  Inpatient Medications    Scheduled Meds: . bisoprolol  2.5 mg Oral Daily  . Chlorhexidine Gluconate Cloth  6 each Topical Daily  . escitalopram  5 mg Oral Daily  . furosemide  80 mg Intravenous BID   Continuous Infusions: . amiodarone 60 mg/hr (12/01/18 5993)  . heparin 1,450 Units/hr (12/01/18 0249)  . milrinone 0.25 mcg/kg/min (12/01/18 0300)  . phenylephrine (NEO-SYNEPHRINE) Adult infusion     PRN Meds: acetaminophen **OR** acetaminophen, ondansetron **OR** ondansetron (ZOFRAN) IV, promethazine, zolpidem   Vital Signs    Vitals:   12/01/18 0622 12/01/18 0650 12/01/18 0700 12/01/18 0742  BP: (!) 86/49 (!) 78/54 (!) 93/52 91/75  Pulse:  (!) 42 100 (!) 56  Resp: 18 16 16  (!) 21  Temp:    98.6 F (37 C)  TempSrc:    Oral  SpO2:  93% 92% 97%  Weight:      Height:        Intake/Output Summary (Last 24 hours) at 12/01/2018 0808 Last data filed at 12/01/2018 0530 Gross per 24 hour  Intake 560.01 ml  Output 1470 ml  Net -909.99 ml   Last 3 Weights 12/01/2018 11/30/2018 11/30/2018  Weight (lbs) 272 lb 4.3 oz 269 lb 13.5 oz 278 lb  Weight (kg) 123.5 kg 122.4 kg 126.1 kg      Telemetry    Atrial fibrillation with rapid ventricular rate, ventricular rate into the 160s, frequent PVCs, short/brief runs of NSVT- Personally Reviewed  ECG    No  new tracings- Personally Reviewed  Physical Exam   GEN: No acute distress.  Lying in bed. Neck: + JVD, though JVD difficult to assess due to body habitus Cardiac: IRIR, tachycardic / poorly controlled ventricular rate, no murmurs, rubs, or gallops.  Respiratory:  Bilateral wheezing, bibasilar crackles. GI:  Obese, firm MS: 2-3+ pitting bilateral lower extremity edema; No deformity. Neuro:  Nonfocal  Psych: Normal affect   Labs    High Sensitivity Troponin:   Recent Labs  Lab 11/30/18 1052 11/30/18 1254 11/30/18 2024 11/30/18 2206  TROPONINIHS 29* 31* 36* 30*      Cardiac EnzymesNo results for input(s): TROPONINI in the last 168 hours. No results for input(s): TROPIPOC in the last 168 hours.   Chemistry Recent Labs  Lab 11/30/18 1048 11/30/18 1823 12/01/18 0507  NA 134* 134* 135  K 3.5 3.6 3.5  CL 99 100 100  CO2 20* 22 24  GLUCOSE 122* 137* 107*  BUN 14 15 17   CREATININE 1.34* 1.46* 1.57*  CALCIUM 8.6* 8.7* 8.5*  PROT 7.0  --   --   ALBUMIN 3.6  --   --   AST 33  --   --   ALT 24  --   --   ALKPHOS 87  --   --   BILITOT 2.4*  --   --  GFRNONAA 60* 54* 49*  GFRAA >60 >60 57*  ANIONGAP 15 12 11      Hematology Recent Labs  Lab 11/30/18 1048 12/01/18 0507  WBC 8.6 9.2  RBC 5.00 4.69  HGB 15.1 14.2  HCT 46.4 43.6  MCV 92.8 93.0  MCH 30.2 30.3  MCHC 32.5 32.6  RDW 15.9* 16.0*  PLT 208 218    BNPNo results for input(s): BNP, PROBNP in the last 168 hours.   DDimer No results for input(s): DDIMER in the last 168 hours.   Radiology    Dg Chest Port 1 View  Result Date: 11/30/2018 CLINICAL DATA:  Shortness of breath. EXAM: PORTABLE CHEST 1 VIEW COMPARISON:  Chest x-ray dated August 06, 2018. FINDINGS: Stable cardiomegaly. Normal pulmonary vascularity. No focal consolidation, pleural effusion, or pneumothorax. No acute osseous abnormality. IMPRESSION: 1. Cardiomegaly.  No active disease. Electronically Signed   By: August 08, 2018 M.D.   On: 11/30/2018  11:37    Cardiac Studies   Echo 11/30/2018  1. Left ventricular ejection fraction, by visual estimation, is 25 to 30%. The left ventricle has moderate to severely decreased function. Normal left ventricular size. There is moderately increased left ventricular hypertrophy.Severe hypokinesis of the  anterior, anteroseptal and apical walls  2. Global right ventricle has moderately reduced systolic function.The right ventricular size is normal. No increase in right ventricular wall thickness.  3. Left atrial size was moderately dilated.  4. Mildly elevated pulmonary artery systolic pressure.  5. Arrhythmia noted, rate 133 bpm  11/20/2018 TEE  1. Left ventricular ejection fraction, by visual estimation, is 20 to 25%. The left ventricle has severely decreased function. There is moderately increased left ventricular hypertrophy.  2. Global right ventricle was not assessed.The right ventricular size is not assessed. Right vetricular wall thickness was not assessed.  3. Left atrial size was not assessed.  4. There is no left atrial or left atrial appendage thrombus.  5. Right atrial size was not assessed.  6. Moderate pericardial effusion.  7. The pericardial effusion is circumferential.  8. The mitral valve is normal in structure. Moderate mitral valve regurgitation.  9. The tricuspid valve is not assessed. Tricuspid valve regurgitation was not assessed by color flow Doppler. 10. The aortic valve was not assessed Aortic valve regurgitation was not assessed by color flow Doppler. 11. The pulmonic valve was not assessed. Pulmonic valve regurgitation was not assessed by color flow Doppler. 12. Aortic root could not be assessed. 13. The interatrial septum was not assessed.  Patient Profile     53 y.o. male with a history of HFrEF, persistent atrial fibrillation s/p recent cardioversion, prior left atrial appendage thrombus, questionable history of hypertrophic cardiomyopathy, moderate  pericardial effusion, morbid obesity, and acute stress reaction who is being seen today for the evaluation of recurrent atrial fibrillation with RVR and heart failure.  Assessment & Plan    Acute on chronic systolic heart failure --Presented with worsening edema and exertional dyspnea/orthopnea. Urine output has not been optimal with escalation of furosemide.  --Echo shows LVEF 25-30%, improved from previous TEE estimated LVEF of 20-25%.  --Still significantly volume overloaded on exam. Diuresis complicated by increasing SCr. Continue to monitor I/Os, daily weights. Daily BMET. Cr 1.46  1.57; BUN 15  17. -910cc yesterday with goal output of -2L/24h. Weight increased overnight 269.84lbs  272.27lbs with patient reporting his baseline at 250lbs.  --Continue bisoprolol 2.5mg  daily, digoxin 0.25mg , amiodarone, milrinone 0.25mg /kg/min for ionotropic support and with consideration of his HR and BP. Continue  IV lasix 80mg  BID.  --Optimize HF treatment and volume status before tentative plan for DCCV as below. Started on phenylephrine this AM, which will elevated rates already into the 160s. Consider that continued elevated ventricular rates, as well as hypotension, will likely result in continued poor urine output and prerenal AKI.   Hypokalemia --Patient reports responded poorly to potassium drip. --Ordered KCl tab 74mEq x2. Replete with goal 4.0.  --Mg at goal and 2.1 with goal 2.0. Daily BMET.  Persistent atrial fibrillation  --Cardiology contacted overnight d/t nausea and dizziness despite antiemetics. S/p initially successful TEE/DCCV ~2 weeks ago and back in Afib with RVR this admisison. Patient reports usually asymptomatic in Afib.  --Continue amiodarone, bisoprolol, milrinone, and digoxin. BB escalation has been limited by low output heart failure. Avoid non-dihydropyridine CCB in this setting. Continue IV heparin with current [place] held during admission.  --Tentative plan for DCCV next week  (Monday) if still in atrial fibrillation with difficult to control rate. Plan to optimize heart failure before first and before DCCV to increase the chances of holding cardioversion.  Elevated High Sensitivity Tn --Troponin minimally elevated and flat trending, peaking at 36, and not consistent with ACS. Likely supply demand ischemia in the setting of AKI, rapid ventricular rate, & volume overload. Due to significantly reduced EF, will need reassessment of EF and future ischemic workup if it remains reduced once back in SR.  Pericardial effusion --Moderate pericardial effusion on TEE with repeat limited imaging per progress notes showing a moderate, posterior, pericardial effusion though not reported on. Of note, it is not thought that the effusion is causing his sx.     For questions or updates, please contact [place] Please consult [email or web address] for contact info under        Signed, Arvil Chaco, PA-C  12/01/2018, 8:08 AM

## 2018-12-01 NOTE — Progress Notes (Signed)
[facility] NAME: Kenneth Ferguson    MR#:  [id number]  DATE OF BIRTH:  Oct 01, 1965  SUBJECTIVE:  Patient received digoxin yesterday and started on milrinone.  Patient still on heparin drip and amiodarone. Shortness of breath or palpitations.  Denies chest pain.  REVIEW OF SYSTEMS:    Review of Systems  Constitutional: Negative for fever, chills weight loss HENT: Negative for ear pain, nosebleeds, congestion, facial swelling, rhinorrhea, neck pain, neck stiffness and ear discharge.   Respiratory: Negative for cough, shortness of breath, wheezing  Cardiovascular: Negative for chest pain, palpitations and leg swelling.  Gastrointestinal: Negative for heartburn, abdominal pain, vomiting, diarrhea or consitpation Genitourinary: Negative for dysuria, urgency, frequency, hematuria Musculoskeletal: Negative for back pain or joint pain Neurological: Negative for dizziness, seizures, syncope, focal weakness,  numbness and headaches.  Hematological: Does not bruise/bleed easily.  Psychiatric/Behavioral: Negative for hallucinations, confusion, dysphoric mood    Tolerating Diet: yes      DRUG ALLERGIES:  No Known Allergies  VITALS:  Blood pressure 95/63, pulse 84, temperature 98.6 F (37 C), temperature source Oral, resp. rate 18, height 5\' 8"  (1.727 m), weight 123.5 kg, SpO2 95 %.  PHYSICAL EXAMINATION:  Constitutional: Appears well-developed and well-nourished. No distress. HENT: Normocephalic. Marland Kitchen Oropharynx is clear and moist.  Eyes: Conjunctivae and EOM are normal. PERRLA, no scleral icterus.  Neck: Normal ROM. Neck supple. No JVD. No tracheal deviation. CVS: Tachycardic with irregular irregular heartbeat no murmurs no gallops, no carotid bruit.  Pulmonary: Effort and breath sounds normal, no stridor, rhonchi, wheezes, rales.  Abdominal: Soft. BS +,  no distension, tenderness, rebound or guarding.  Musculoskeletal: Normal range of  motion. No edema and no tenderness.  Neuro: Alert. CN 2-12 grossly intact. No focal deficits. Skin: Skin is warm and dry. No rash noted. Psychiatric: Normal mood and affect.      LABORATORY PANEL:   CBC Recent Labs  Lab 12/01/18 0507  WBC 9.2  HGB 14.2  HCT 43.6  PLT 218   ------------------------------------------------------------------------------------------------------------------  Chemistries  Recent Labs  Lab 11/30/18 1048  12/01/18 0507  NA 134*   < > 135  K 3.5   < > 3.5  CL 99   < > 100  CO2 20*   < > 24  GLUCOSE 122*   < > 107*  BUN 14   < > 17  CREATININE 1.34*   < > 1.57*  CALCIUM 8.6*   < > 8.5*  MG  --    < > 2.1  AST 33  --   --   ALT 24  --   --   ALKPHOS 87  --   --   BILITOT 2.4*  --   --    < > = values in this interval not displayed.   ------------------------------------------------------------------------------------------------------------------  Cardiac Enzymes No results for input(s): TROPONINI in the last 168 hours. ------------------------------------------------------------------------------------------------------------------  RADIOLOGY:  Dg Chest Port 1 View  Result Date: 11/30/2018 CLINICAL DATA:  Shortness of breath. EXAM: PORTABLE CHEST 1 VIEW COMPARISON:  Chest x-ray dated August 06, 2018. FINDINGS: Stable cardiomegaly. Normal pulmonary vascularity. No focal consolidation, pleural effusion, or pneumothorax. No acute osseous abnormality. IMPRESSION: 1. Cardiomegaly.  No active disease. Electronically Signed   By: Titus Dubin M.D.   On: 11/30/2018 11:37     ASSESSMENT AND PLAN:   53 year old male with heart failure reduced ejection fraction, persistent atrial fibrillation status post recent cardioversion and left atrial appendage  thrombus who presented to the emergency room due to shortness of breath and found to have atrial fibrillation with RVR.   1.  Persistent atrial fibrillation with RVR status post recent TEE guided  cardioversion: Patient's heart rate still uncontrolled despite amiodarone and milrinone.  Digoxin ordered by cardiologist morning.  Plan for cardioversion tomorrow. Continue heparin  2.  Acute on chronic systolic heart failure percent: Continue Lasix 80 mg IV twice daily Continue to monitor intake and output with daily weight Continue bisoprolol CHF clinic upon discharge  3. Essential hypertension: Continue bisoprolol  4.  Depression with the loss of his wife recently: Continue Lexapro   Management plans discussed with the patient and he is in agreement.  CODE STATUS: full  TOTAL TIME TAKING CARE OF THIS PATIENT: 30 minutes.     POSSIBLE D/C 3-4 days, DEPENDING ON CLINICAL CONDITION.   Adrian Saran M.D on 12/01/2018 at 11:34 AM  Between 7am to 6pm - Pager - [phone] After 6pm go to [email or web address] - password [email or web address]  [facility]  Office  [phone]  CC: Primary care physician; Patient, [name]  Note: This dictation was prepared with Dragon dictation along with smaller phrase technology. Any transcriptional errors that result from this process are unintentional.

## 2018-12-01 NOTE — Progress Notes (Signed)
ANTICOAGULATION CONSULT NOTE  Pharmacy Consult for heparin Indication: atrial fibrillation  No Known Allergies  Patient Measurements: Height: 5\' 8"  (172.7 cm) Weight: 272 lb 4.3 oz (123.5 kg) IBW/kg (Calculated) : 68.4 Heparin Dosing Weight: 96.6 kg  Vital Signs: Temp: 98.6 F (37 C) (10/24 0742) Temp Source: Oral (10/24 0742) BP: 106/85 (10/24 0800) Pulse Rate: 108 (10/24 0800)  Labs: Recent Labs    11/30/18 1048  11/30/18 1254 11/30/18 1823 11/30/18 2024 11/30/18 2206 12/01/18 0507  HGB 15.1  --   --   --   --   --  14.2  HCT 46.4  --   --   --   --   --  43.6  PLT 208  --   --   --   --   --  218  APTT 35  --   --   --   --   --  >160*  LABPROT 23.1*  --   --   --   --   --   --   INR 2.1*  --   --   --   --   --   --   HEPARINUNFRC >3.60*  --   --   --   --   --   --   CREATININE 1.34*  --   --  1.46*  --   --  1.57*  TROPONINIHS  --    < > 31*  --  36* 30*  --    < > = values in this interval not displayed.    Estimated Creatinine Clearance: 68.8 mL/min (A) (by C-G formula based on SCr of 1.57 mg/dL (H)).   Medical History: Past Medical History:  Diagnosis Date  . HFrEF (heart failure with reduced ejection fraction) ([place])   . Persistent atrial fibrillation ([place])   . Thrombus of left atrial appendage without antecedent myocardial infarction     Assessment: 53 year old male with afib on Eliquis PTA. Patient admitted from [place] office for worsening SOB, progressive edema and weight gain. Per Cardiologist's recommendation, will hold Eliquis at this time and start heparin drip in case procedure is necessary. Pharmacy consulted for heparin drip management. Patient reports last dose of Eliquis 10/23 ~ 0800.  Goal of Therapy:  Heparin level 0.3-0.7 units/ml aPTT 66-102 seconds Monitor platelets by anticoagulation protocol: Yes   Plan:  Baseline labs have been ordered. With last dose of Eliquis at 0800, will defer starting heparin drip until  approximately 2000. Will follow APTTs until correlation with HL. Heparin 5000 unit bolus followed by heparin drip at 1450 units/hr. APTT 10/24 at 0200. CBC with morning labs. Will defer HL until 10/25 am.    10/24 0507 (reported at 0810) aPTT >160.   Stop Heparin drip for 60 minutes as per protocol.  Restart at 1250 units/hr. Recheck aPTT in 6 hours.  Jaishaun Mcnab K, RPH 12/01/2018,8:48 AM

## 2018-12-01 NOTE — Consult Note (Signed)
CRITICAL CARE NOTE      CHIEF COMPLAINT:   AFrvr   SUBJECTIVE FINDINGS & SIGNIFICANT EVENTS    CONSULTATION DATE: 11/30/2018  REFERRING MD :  Dr. Okey Dupre  CHIEF COMPLAINT:  Atrial Fibrillation with RVR    HISTORY OF PRESENT ILLNESS:   Kenneth Ferguson is a 53 year old male who presented to his cardiologist, Dr. Okey Dupre, this morning for worsening edema of his extremities and shortness of breath for the past week. Pt has a history of HFrEF, persistent atrial fibrillation and morbid obesity. Patient was cardioverted approximately one week ago for his afib. After the cardioversion last week, pt reports he noticed more episodes of SOB with minimal exertion and laying flat as well as more swelling of his hands and feet. Pt had been taking 40 mg Lasix BID, but after symptoms persisted, he received permission from Dr. Okey Dupre to take 80 mg BID. Pt reports symptoms still persisted. This morning when he went to see Dr. Okey Dupre, pt was found to be in afib with RVR. Pt was transferred to ICU for further management.    PAST MEDICAL HISTORY   Past Medical History:  Diagnosis Date  . HFrEF (heart failure with reduced ejection fraction) (HCC)   . Persistent atrial fibrillation (HCC)   . Thrombus of left atrial appendage without antecedent myocardial infarction      SURGICAL HISTORY   Past Surgical History:  Procedure Laterality Date  . CARDIOVERSION N/A 08/08/2018   Procedure: CARDIOVERSION;  Surgeon: Antonieta Iba, MD;  Location: [facility]S;  Service: Cardiovascular;  Laterality: N/A;  . CARDIOVERSION N/A 11/20/2018   Procedure: CARDIOVERSION;  Surgeon: Yvonne Kendall, MD;  Location: [facility];  Service: Cardiovascular;  Laterality: N/A;  . CARDIOVERSION N/A 11/20/2018   Procedure: CARDIOVERSION;  Surgeon: Yvonne Kendall,  MD;  Location: [facility];  Service: Cardiovascular;  Laterality: N/A;  . TEE WITHOUT CARDIOVERSION N/A 08/08/2018   Procedure: TRANSESOPHAGEAL ECHOCARDIOGRAM (TEE);  Surgeon: Antonieta Iba, MD;  Location: [facility] OR[facility];  Service: Cardiovascular;  Laterality: N/A;  . TEE WITHOUT CARDIOVERSION N/A 11/20/2018   Procedure: TRANSESOPHAGEAL ECHOCARDIOGRAM (TEE);  Surgeon: Yvonne Kendall, MD;  Location: [facility];  Service: Cardiovascular;  Laterality: N/A;     FAMILY HISTORY   Family History  Problem Relation Age of Onset  . Emphysema Mother   . Cancer Father      SOCIAL HISTORY   Social History   Tobacco Use  . Smoking status: Never Smoker  . Smokeless tobacco: Never Used  Substance Use Topics  . Alcohol use: Never    Frequency: Never  . Drug use: Never     MEDICATIONS   Current Medication:  Current Facility-Administered Medications:  .  acetaminophen (TYLENOL) tablet 650 mg, 650 mg, Oral, Q6H PRN, 650 mg at 12/01/18 1008 **OR** acetaminophen (TYLENOL) suppository 650 mg, 650 mg, Rectal, Q6H PRN, Cherlynn Kaiser, Rolly Pancake, MD .  [address] amiodarone (NEXTERONE) 1.8 mg/mL load via infusion 150 mg, 150 mg, Intravenous, Once, 150 mg at 11/30/18 1032 **FOLLOWED BY** [EXPIRED] amiodarone (NEXTERONE PREMIX) 360-4.14 MG/200ML-% (1.8 mg/mL) IV infusion, 60 mg/hr, Intravenous, Continuous, Stopped at 11/30/18 2242 **FOLLOWED BY** amiodarone (NEXTERONE PREMIX) 360-4.14 MG/200ML-% (1.8 mg/mL) IV infusion, 60 mg/hr, Intravenous, Continuous, Blakeney, Dana G, NP, Last Rate: 33.3 mL/hr at 12/01/18 0632, 60 mg/hr at 12/01/18 6237 .  Chlorhexidine Gluconate Cloth 2 % PADS 6 each, 6 each, Topical, Daily, Erin Fulling, MD, 6 each at 11/30/18 1057 .  digoxin (LANOXIN) tablet 0.25 mg, 0.25 mg, Oral, Q6H,  Marrianne Mood D, PA-C, 0.25 mg at 12/01/18 1008 .  furosemide (LASIX) injection 80 mg, 80 mg, Intravenous, BID, Kasa, Kurian, MD, 80 mg at 12/01/18 0743 .  heparin ADULT infusion 100 units/mL (25000  units/215mL sodium chloride 0.45%), 1,250 Units/hr, Intravenous, Continuous, Sainani, Vivek J, MD, Last Rate: 12.5 mL/hr at 12/01/18 1230, 1,250 Units/hr at 12/01/18 1230 .  milrinone (PRIMACOR) 20 MG/100 ML (0.2 mg/mL) infusion, 0.25 mcg/kg/min, Intravenous, Continuous, End, Christopher, MD, Last Rate: 9.18 mL/hr at 12/01/18 0300, 0.25 mcg/kg/min at 12/01/18 0300 .  ondansetron (ZOFRAN) tablet 4 mg, 4 mg, Oral, Q6H PRN **OR** ondansetron (ZOFRAN) injection 4 mg, 4 mg, Intravenous, Q6H PRN, Henreitta Leber, MD, 4 mg at 11/30/18 1834 .  potassium chloride SA (KLOR-CON) CR tablet 20 mEq, 20 mEq, Oral, BID, Visser, Jacquelyn D, PA-C, 20 mEq at 12/01/18 1008 .  promethazine (PHENERGAN) injection 12.5-25 mg, 12.5-25 mg, Intravenous, Q6H PRN, Awilda Bill, NP .  zolpidem (AMBIEN) tablet 5 mg, 5 mg, Oral, QHS PRN, End, Harrell Gave, MD    ALLERGIES   Patient has no known allergies.    REVIEW OF SYSTEMS    10 point ros done and is negative except as per HPI and subj findings  PHYSICAL EXAMINATION   Vitals:   12/01/18 1225 12/01/18 1228  BP:    Pulse: (!) 112 (!) 29  Resp: (!) 23 20  Temp:  97.7 F (36.5 C)  SpO2: 95% 95%    GENERAL:NAD HEAD: Normocephalic, atraumatic.  EYES: Pupils equal, round, reactive to light.  No scleral icterus.  MOUTH: Moist mucosal membrane. NECK: Supple. No thyromegaly. No nodules. No JVD.  PULMONARY: decreased bs b/l CARDIOVASCULAR: S1 and S2. IRRegular rate and rhythm. No murmurs, rubs, or gallops.  GASTROINTESTINAL: Soft, nontender, non-distended. No masses. Positive bowel sounds. No hepatosplenomegaly.  MUSCULOSKELETAL: No swelling, clubbing, or edema.  NEUROLOGIC: Mild distress due to acute illness SKIN:intact,warm,dry   PERTINENT DATA      Lines / Drains: PIV   Cultures / Sepsis markers: none  Antibiotics: None    Protocols / Consultants: cardiology  Tests / Events: Poss cardioversion  Overnight: improved       Infusions: . amiodarone 60 mg/hr (12/01/18 8101)  . heparin 1,250 Units/hr (12/01/18 1230)  . milrinone 0.25 mcg/kg/min (12/01/18 0300)   Scheduled Medications: . Chlorhexidine Gluconate Cloth  6 each Topical Daily  . digoxin  0.25 mg Oral Q6H  . furosemide  80 mg Intravenous BID  . potassium chloride  20 mEq Oral BID   PRN Medications: acetaminophen **OR** acetaminophen, ondansetron **OR** ondansetron (ZOFRAN) IV, promethazine, zolpidem Hemodynamic parameters:   Intake/Output: 10/23 0701 - 10/24 0700 In: 560 [I.V.:543.9; IV Piggyback:16.1] Out: 7510 [Urine:1470]  Ventilator  Settings:      LAB RESULTS:  Basic Metabolic Panel: Recent Labs  Lab 11/30/18 1048 11/30/18 1823 12/01/18 0507 12/01/18 1353  NA 134* 134* 135  --   K 3.5 3.6 3.5  --   CL 99 100 100  --   CO2 20* 22 24  --   GLUCOSE 122* 137* 107*  --   BUN 14 15 17   --   CREATININE 1.34* 1.46* 1.57* 1.53*  CALCIUM 8.6* 8.7* 8.5*  --   MG  --  2.2 2.1  --   PHOS  --  3.5 3.6  --    Liver Function Tests: Recent Labs  Lab 11/30/18 1048  AST 33  ALT 24  ALKPHOS 87  BILITOT 2.4*  PROT 7.0  ALBUMIN 3.6  No results for input(s): LIPASE, AMYLASE in the last 168 hours. No results for input(s): AMMONIA in the last 168 hours. CBC: Recent Labs  Lab 11/30/18 1048 12/01/18 0507  WBC 8.6 9.2  HGB 15.1 14.2  HCT 46.4 43.6  MCV 92.8 93.0  PLT 208 218   Cardiac Enzymes: No results for input(s): CKTOTAL, CKMB, CKMBINDEX, TROPONINI in the last 168 hours. BNP: Invalid input(s): POCBNP CBG: Recent Labs  Lab 11/30/18 0956  GLUCAP 98     IMAGING RESULTS:  Imaging: Dg Chest Port 1 View  Result Date: 11/30/2018 CLINICAL DATA:  Shortness of breath. EXAM: PORTABLE CHEST 1 VIEW COMPARISON:  Chest x-ray dated August 06, 2018. FINDINGS: Stable cardiomegaly. Normal pulmonary vascularity. No focal consolidation, pleural effusion, or pneumothorax. No acute osseous abnormality. IMPRESSION: 1. Cardiomegaly.   No active disease. Electronically Signed   By: Obie Dredge M.D.   On: 11/30/2018 11:37         ASSESSMENT AND PLAN      Acute on chronic decompensated systolic CHF with EF 25-30% -cardiology on case appreciate input -diuresis - lasix 80 bid - strict I&O - patient counselled regarding fluid intake - has large jugs of sweet tea at bedsdie Bisoprolol 2.5 daily and dig 0.25mg  daily  -dig level    AF rvr -amio drip -heparin gtt -plan for cardioversion when chf is medically optimized ICU monitoring  Renal Failure acute - stage 1  -close monitoring with aggressive diuresis -follow chem 7 -follow UO -continue Foley Catheter-assess need daily    GI/Nutrition GI PROPHYLAXIS as indicated-currently on heparin gtt DIET-->TF's as tolerated Constipation protocol as indicated  ENDO - ICU hypoglycemic\Hyperglycemia protocol -check FSBS per protocol   ELECTROLYTES -follow labs as needed -replace as needed -pharmacy consultation   DVT/GI PRX ordered -SCDs  TRANSFUSIONS AS NEEDED MONITOR FSBS ASSESS the need for LABS as needed   Critical care provider statement:    Critical care time (minutes):  32   Critical care time was exclusive of:  Separately billable procedures and treating other patients   Critical care was necessary to treat or prevent imminent or life-threatening deterioration of the following conditions:   Acute on chronic systolic CHF, atrial fibrillation with rapid ventricular response, multiple comorbid conditions   Critical care was time spent personally by me on the following activities:  Development of treatment plan with patient or surrogate, discussions with consultants, evaluation of patient's response to treatment, examination of patient, obtaining history from patient or surrogate, ordering and performing treatments and interventions, ordering and review of laboratory studies and re-evaluation of patient's condition.  I assumed direction of critical  care for this patient from another provider in my specialty: no    This document was prepared using Dragon voice recognition software and may include unintentional dictation errors.    Vida Rigger, M.D.  Division of Pulmonary & Critical Care Medicine  [facility]

## 2018-12-02 DIAGNOSIS — I4819 Other persistent atrial fibrillation: Secondary | ICD-10-CM | POA: Diagnosis not present

## 2018-12-02 DIAGNOSIS — I5023 Acute on chronic systolic (congestive) heart failure: Secondary | ICD-10-CM | POA: Diagnosis not present

## 2018-12-02 LAB — CBC WITH DIFFERENTIAL/PLATELET
Abs Immature Granulocytes: 0.02 10*3/uL (ref 0.00–0.07)
Basophils Absolute: 0 10*3/uL (ref 0.0–0.1)
Basophils Relative: 1 %
Eosinophils Absolute: 0.1 10*3/uL (ref 0.0–0.5)
Eosinophils Relative: 1 %
HCT: 42.4 % (ref 39.0–52.0)
Hemoglobin: 13.6 g/dL (ref 13.0–17.0)
Immature Granulocytes: 0 %
Lymphocytes Relative: 33 %
Lymphs Abs: 2.3 10*3/uL (ref 0.7–4.0)
MCH: 29.6 pg (ref 26.0–34.0)
MCHC: 32.1 g/dL (ref 30.0–36.0)
MCV: 92.2 fL (ref 80.0–100.0)
Monocytes Absolute: 0.7 10*3/uL (ref 0.1–1.0)
Monocytes Relative: 10 %
Neutro Abs: 3.8 10*3/uL (ref 1.7–7.7)
Neutrophils Relative %: 55 %
Platelets: 166 10*3/uL (ref 150–400)
RBC: 4.6 MIL/uL (ref 4.22–5.81)
RDW: 16.1 % — ABNORMAL HIGH (ref 11.5–15.5)
WBC: 6.9 10*3/uL (ref 4.0–10.5)
nRBC: 0 % (ref 0.0–0.2)

## 2018-12-02 LAB — BASIC METABOLIC PANEL
Anion gap: 12 (ref 5–15)
BUN: 16 mg/dL (ref 6–20)
CO2: 24 mmol/L (ref 22–32)
Calcium: 8.2 mg/dL — ABNORMAL LOW (ref 8.9–10.3)
Chloride: 100 mmol/L (ref 98–111)
Creatinine, Ser: 1.47 mg/dL — ABNORMAL HIGH (ref 0.61–1.24)
GFR calc Af Amer: >60 mL/min (ref >60)
GFR calc non Af Amer: 53 mL/min — ABNORMAL LOW (ref >60)
Glucose, Bld: 97 mg/dL (ref 70–99)
Potassium: 3 mmol/L — ABNORMAL LOW (ref 3.5–5.1)
Sodium: 136 mmol/L (ref 135–145)

## 2018-12-02 LAB — APTT
aPTT: 60 seconds — ABNORMAL HIGH (ref 24–36)
aPTT: >160 seconds (ref 24–36)

## 2018-12-02 LAB — MAGNESIUM: Magnesium: 2 mg/dL (ref 1.7–2.4)

## 2018-12-02 LAB — HEPARIN LEVEL (UNFRACTIONATED): Heparin Unfractionated: 1.47 IU/mL — ABNORMAL HIGH (ref 0.30–0.70)

## 2018-12-02 LAB — DIGOXIN LEVEL: Digoxin Level: 1 ng/mL (ref 0.8–2.0)

## 2018-12-02 LAB — POTASSIUM: Potassium: 3.6 mmol/L (ref 3.5–5.1)

## 2018-12-02 LAB — PHOSPHORUS: Phosphorus: 3.2 mg/dL (ref 2.5–4.6)

## 2018-12-02 MED ORDER — POTASSIUM CHLORIDE 10 MEQ/100ML IV SOLN
10.0000 meq | INTRAVENOUS | Status: DC
  Filled 2018-12-02 (×4): qty 100

## 2018-12-02 MED ORDER — APIXABAN 5 MG PO TABS
5.0000 mg | ORAL_TABLET | Freq: Two times a day (BID) | ORAL | Status: DC
  Administered 2018-12-02 – 2018-12-07 (×11): 5 mg via ORAL
  Filled 2018-12-02 (×11): qty 1

## 2018-12-02 MED ORDER — CHLORHEXIDINE GLUCONATE CLOTH 2 % EX PADS
6.0000 | MEDICATED_PAD | Freq: Every day | CUTANEOUS | Status: DC
  Administered 2018-12-03 – 2018-12-05 (×2): 6 via TOPICAL

## 2018-12-02 MED ORDER — POTASSIUM CHLORIDE CRYS ER 20 MEQ PO TBCR
40.0000 meq | EXTENDED_RELEASE_TABLET | Freq: Once | ORAL | Status: AC
  Administered 2018-12-02: 40 meq via ORAL
  Filled 2018-12-02: qty 2

## 2018-12-02 NOTE — Progress Notes (Signed)
Progress Note  Patient Name: Kenneth Ferguson Date of Encounter: 12/02/2018  Primary Cardiologist: Yvonne Kendall, MD   Subjective   Patient states feeling better, feels less short of breath, denies chest pain or palpitations, states his edema is improving.  Inpatient Medications    Scheduled Meds: . apixaban  5 mg Oral BID  . Chlorhexidine Gluconate Cloth  6 each Topical Daily  . furosemide  80 mg Intravenous BID   Continuous Infusions: . amiodarone 60 mg/hr (12/02/18 0933)  . milrinone Stopped (12/01/18 2229)  . potassium chloride     PRN Meds: acetaminophen **OR** acetaminophen, ondansetron **OR** ondansetron (ZOFRAN) IV, promethazine, zolpidem   Vital Signs    Vitals:   12/02/18 0200 12/02/18 0300 12/02/18 0400 12/02/18 0500  BP: (!) 92/59 93/70 (!) 87/63 (!) 87/51  Pulse: (!) 105 69 71 96  Resp: 17 16 (!) 23 15  Temp:   (!) 97.4 F (36.3 C)   TempSrc:   Oral   SpO2: 92% 93% (!) 83% 95%  Weight:      Height:        Intake/Output Summary (Last 24 hours) at 12/02/2018 0940 Last data filed at 12/02/2018 0600 Gross per 24 hour  Intake 1102.45 ml  Output 2100 ml  Net -997.55 ml   Last 3 Weights 12/01/2018 11/30/2018 11/30/2018  Weight (lbs) 272 lb 4.3 oz 269 lb 13.5 oz 278 lb  Weight (kg) 123.5 kg 122.4 kg 126.1 kg      Telemetry    Atrial fibrillation heart rate 110s- Personally Reviewed  ECG    Performed today-  Physical Exam   GEN: No acute distress.   Neck: No JVD Cardiac:  Irregular irregular, tachycardic, no murmurs, rubs, or gallops.  Respiratory: Clear to auscultation bilaterally. GI: Soft, nontender, non-distended  MS:  2+ edema; No deformity. Neuro:  Nonfocal  Psych: Normal affect   Labs    High Sensitivity Troponin:   Recent Labs  Lab 11/30/18 1052 11/30/18 1254 11/30/18 2024 11/30/18 2206  TROPONINIHS 29* 31* 36* 30*      Chemistry Recent Labs  Lab 11/30/18 1048 11/30/18 1823 12/01/18 0507 12/01/18 1353 12/02/18  0532  NA 134* 134* 135  --  136  K 3.5 3.6 3.5  --  3.0*  CL 99 100 100  --  100  CO2 20* 22 24  --  24  GLUCOSE 122* 137* 107*  --  97  BUN 14 15 17   --  16  CREATININE 1.34* 1.46* 1.57* 1.53* 1.47*  CALCIUM 8.6* 8.7* 8.5*  --  8.2*  PROT 7.0  --   --   --   --   ALBUMIN 3.6  --   --   --   --   AST 33  --   --   --   --   ALT 24  --   --   --   --   ALKPHOS 87  --   --   --   --   BILITOT 2.4*  --   --   --   --   GFRNONAA 60* 54* 49* 51* 53*  GFRAA >60 >60 57* 59* >60  ANIONGAP 15 12 11   --  12     Hematology Recent Labs  Lab 11/30/18 1048 12/01/18 0507 12/02/18 0532  WBC 8.6 9.2 6.9  RBC 5.00 4.69 4.60  HGB 15.1 14.2 13.6  HCT 46.4 43.6 42.4  MCV 92.8 93.0 92.2  MCH 30.2 30.3 29.6  MCHC 32.5 32.6 32.1  RDW 15.9* 16.0* 16.1*  PLT 208 218 166    BNPNo results for input(s): BNP, PROBNP in the last 168 hours.   DDimer No results for input(s): DDIMER in the last 168 hours.   Radiology    Dg Chest Port 1 View  Result Date: 11/30/2018 CLINICAL DATA:  Shortness of breath. EXAM: PORTABLE CHEST 1 VIEW COMPARISON:  Chest x-ray dated August 06, 2018. FINDINGS: Stable cardiomegaly. Normal pulmonary vascularity. No focal consolidation, pleural effusion, or pneumothorax. No acute osseous abnormality. IMPRESSION: 1. Cardiomegaly.  No active disease. Electronically Signed   By: Titus Dubin M.D.   On: 11/30/2018 11:37    Cardiac Studies   None obtained today  Patient Profile     53 y.o. male with history of heart failure reduced ejection fraction EF 20 to 25%, persistent atrial fibrillation who presents due to fluid overload.  Assessment & Plan    Overall patient feels much better, he is net -1.3 L.  His creatinine has been stable, slightly improved compared to yesterday.   1.  Heart failure reduced ejection fraction, EF 20 to 25%. Patient is net -1.3 L over the past 24 hours. Continue Lasix at current dose of 80 mg twice daily Start milrinone at 0.25 MCG per  kilogram per minute   2.    Persistent atrial fibrillation -Continue amiodarone for now -Stop heparin, start Eliquis 5 mg twice daily   Greater than 50% was spent in counseling and coordination of care with patient Total encounter time 35 minutes or more    Signed, Kate Sable, MD  12/02/2018, 9:40 AM

## 2018-12-02 NOTE — Progress Notes (Signed)
[employer] - [place] at [facility]   PATIENT NAME: Kenneth Ferguson    MR#:  [id number]  DATE OF BIRTH:  1965/02/12  SUBJECTIVE:   Patient feels better.  Denies shortness of breath.  Heart rate better controlled this morning.  Denies chest pain.  No PND or orthopnea.  REVIEW OF SYSTEMS:    Review of Systems  Constitutional: Negative for fever, chills weight loss HENT: Negative for ear pain, nosebleeds, congestion, facial swelling, rhinorrhea, neck pain, neck stiffness and ear discharge.   Respiratory: Negative for cough, shortness of breath, wheezing  Cardiovascular: Negative for chest pain, palpitations and leg swelling.  Gastrointestinal: Negative for heartburn, abdominal pain, vomiting, diarrhea or consitpation Genitourinary: Negative for dysuria, urgency, frequency, hematuria Musculoskeletal: Negative for back pain or joint pain Neurological: Negative for dizziness, seizures, syncope, focal weakness,  numbness and headaches.  Hematological: Does not bruise/bleed easily.  Psychiatric/Behavioral: Negative for hallucinations, confusion, dysphoric mood    Tolerating Diet: yes      DRUG ALLERGIES:  No Known Allergies  VITALS:  Blood pressure (!) 87/51, pulse 96, temperature (!) 97.4 F (36.3 C), temperature source Oral, resp. rate 15, height 5\' 8"  (1.727 m), weight 123.5 kg, SpO2 95 %.  PHYSICAL EXAMINATION:  Constitutional: Appears well-developed and well-nourished. No distress. HENT: Normocephalic. Oropharynx is clear and moist.  Eyes: Conjunctivae and EOM are normal. PERRLA, no scleral icterus.  Neck: Normal ROM. Neck supple. No JVD. No tracheal deviation. CVS: Tachycardic with irregular irregular heartbeat no murmurs no gallops, no carotid bruit.  Pulmonary: Effort and breath sounds normal, no stridor, rhonchi, wheezes, rales.  Abdominal: Soft. BS +,  no distension, tenderness, rebound or guarding.  Musculoskeletal: Normal range of motion. No edema  and no tenderness.  Neuro: Alert. CN 2-12 grossly intact. No focal deficits. Skin: Skin is warm and dry. No rash noted. Psychiatric: Normal mood and affect.      LABORATORY PANEL:   CBC Recent Labs  Lab 12/02/18 0532  WBC 6.9  HGB 13.6  HCT 42.4  PLT 166   ------------------------------------------------------------------------------------------------------------------  Chemistries  Recent Labs  Lab 11/30/18 1048  12/02/18 0532  NA 134*   < > 136  K 3.5   < > 3.0*  CL 99   < > 100  CO2 20*   < > 24  GLUCOSE 122*   < > 97  BUN 14   < > 16  CREATININE 1.34*   < > 1.47*  CALCIUM 8.6*   < > 8.2*  MG  --    < > 2.0  AST 33  --   --   ALT 24  --   --   ALKPHOS 87  --   --   BILITOT 2.4*  --   --    < > = values in this interval not displayed.   ------------------------------------------------------------------------------------------------------------------  Cardiac Enzymes No results for input(s): TROPONINI in the last 168 hours. ------------------------------------------------------------------------------------------------------------------  RADIOLOGY:  Dg Chest Port 1 View  Result Date: 11/30/2018 CLINICAL DATA:  Shortness of breath. EXAM: PORTABLE CHEST 1 VIEW COMPARISON:  Chest x-ray dated August 06, 2018. FINDINGS: Stable cardiomegaly. Normal pulmonary vascularity. No focal consolidation, pleural effusion, or pneumothorax. No acute osseous abnormality. IMPRESSION: 1. Cardiomegaly.  No active disease. Electronically Signed   By: August 08, 2018 M.D.   On: 11/30/2018 11:37     ASSESSMENT AND PLAN:   53 year old male with heart failure reduced ejection fraction, persistent atrial fibrillation status post recent cardioversion and left  atrial appendage thrombus who presented to the emergency room due to shortness of breath and found to have atrial fibrillation with RVR.   1.  Persistent atrial fibrillation with RVR status post recent TEE guided  cardioversion: Patient on amiodarone and milrinone better heart rates.. Continue heparin drip Management as per cardiology Considering repeat cardioversion vs medications alone   2.  Acute on chronic systolic heart failure percent:  Consider decreasing dose of Lasix as patient appears to be euvolemic  continue to monitor intake and output with daily weight Continue bisoprolol CHF clinic upon discharge  3. Essential hypertension: Continue bisoprolol  4.  Depression with the loss of his wife recently: Continue Lexapro 5.  Hypokalemia from Lasix: Replete and recheck in a.m.  Management plans discussed with the patient and he is in agreement.  CODE STATUS: full  TOTAL TIME TAKING CARE OF THIS PATIENT: 25 minutes.     POSSIBLE D/C 3-4 days, DEPENDING ON CLINICAL CONDITION.   Bettey Costa M.D on 12/02/2018 at 8:56 AM  Between 7am to 6pm - Pager - [phone] After 6pm go to [email or web address] - password [email or web address] [place] [facility]  Office  [phone]  CC: Primary care physician; Patient, [name]  Note: This dictation was prepared with Dragon dictation along with smaller phrase technology. Any transcriptional errors that result from this process are unintentional.

## 2018-12-02 NOTE — Progress Notes (Signed)
ANTICOAGULATION CONSULT NOTE  Pharmacy Consult for heparin Indication: atrial fibrillation  No Known Allergies  Patient Measurements: Height: 5\' 8"  (172.7 cm) Weight: 272 lb 4.3 oz (123.5 kg) IBW/kg (Calculated) : 68.4 Heparin Dosing Weight: 96.6 kg  Vital Signs: Temp: 97.5 F (36.4 C) (10/24 2040) Temp Source: Oral (10/24 2040) BP: 103/76 (10/25 0100) Pulse Rate: 67 (10/25 0100)  Labs: Recent Labs    11/30/18 1048  11/30/18 1254 11/30/18 1823 11/30/18 2024 11/30/18 2206 12/01/18 0507 12/01/18 1353 12/01/18 1618 12/02/18 0010  HGB 15.1  --   --   --   --   --  14.2  --   --   --   HCT 46.4  --   --   --   --   --  43.6  --   --   --   PLT 208  --   --   --   --   --  218  --   --   --   APTT 35  --   --   --   --   --  >160*  --  112* 60*  LABPROT 23.1*  --   --   --   --   --   --   --   --   --   INR 2.1*  --   --   --   --   --   --   --   --   --   HEPARINUNFRC >3.60*  --   --   --   --   --   --   --   --   --   CREATININE 1.34*  --   --  1.46*  --   --  1.57* 1.53*  --   --   TROPONINIHS  --    < > 31*  --  36* 30*  --   --   --   --    < > = values in this interval not displayed.    Estimated Creatinine Clearance: 70.6 mL/min (A) (by C-G formula based on SCr of 1.53 mg/dL (H)).   Medical History: Past Medical History:  Diagnosis Date  . HFrEF (heart failure with reduced ejection fraction) ([place])   . Persistent atrial fibrillation ([place])   . Thrombus of left atrial appendage without antecedent myocardial infarction     Assessment: 53 year old male with afib on Eliquis PTA. Patient admitted from [place] office for worsening SOB, progressive edema and weight gain. Per Cardiologist's recommendation, will hold Eliquis at this time and start heparin drip in case procedure is necessary. Pharmacy consulted for heparin drip management. Patient reports last dose of Eliquis 10/23 ~ 0800.  10/24 @1618  aPTT 112. Heparin infusion was restarted at 1230.  Confirmed with nurse the infusion was stopped, earlier, as scheduled.   Goal of Therapy:  Heparin level 0.3-0.7 units/ml aPTT 66-102 seconds Monitor platelets by anticoagulation protocol: Yes   Plan:  10/25 @ 0010 aPTT 60 seconds subtherapeutic. Will increase rate to 1150 units/hr and will recheck aPTT at 0800 w/ HL, CBC stable will continue to monitor  Tobie Lords, PharmD, BCPS Clinical Pharmacist 12/02/2018,1:53 AM

## 2018-12-02 NOTE — Progress Notes (Signed)
[place] for Electrolyte Monitoring and Replacement   Recent Labs: Potassium (mmol/L)  Date Value  12/02/2018 3.0 (L)   Magnesium (mg/dL)  Date Value  12/02/2018 2.0   Calcium (mg/dL)  Date Value  12/02/2018 8.2 (L)   Albumin (g/dL)  Date Value  11/30/2018 3.6   Phosphorus (mg/dL)  Date Value  12/02/2018 3.2   Sodium (mmol/L)  Date Value  12/02/2018 136     Assessment: 53 year old male with afib on Eliquis PTA. Admitted from [place] office. Patient to start on Lasix 80 mg IV BID per recommendation from Cardiology. Patient normally takes potassium supplement at home with his Lasix. Pharmacy to monitor electrolytes.  Goal of Therapy:  Electrolytes WNL  Plan:  Will give patient potassium 40 mEq IV today. Recheck K tonight at 18:00.  Continue Lasix 80 mg IV BID. Patient may require an increase in potassium supplement if continues on Lasix.  Olivia Canter [facility] Clinical Pharmacist 12/02/2018 9:34 AM

## 2018-12-02 NOTE — Consult Note (Signed)
CRITICAL CARE NOTE      CHIEF COMPLAINT:   AFrvr   SUBJECTIVE FINDINGS & SIGNIFICANT EVENTS    CONSULTATION DATE: 11/30/2018  REFERRING MD :  Dr. Okey Dupre  CHIEF COMPLAINT:  Atrial Fibrillation with RVR    HISTORY OF PRESENT ILLNESS:   Kenneth Ferguson is a 53 year old male who presented to his cardiologist, Dr. Okey Dupre, this morning for worsening edema of his extremities and shortness of breath for the past week. Pt has a history of HFrEF, persistent atrial fibrillation and morbid obesity. Patient was cardioverted approximately one week ago for his afib. After the cardioversion last week, pt reports he noticed more episodes of SOB with minimal exertion and laying flat as well as more swelling of his hands and feet. Pt had been taking 40 mg Lasix BID, but after symptoms persisted, he received permission from Dr. Okey Dupre to take 80 mg BID. Pt reports symptoms still persisted. This morning when he went to see Dr. Okey Dupre, pt was found to be in afib with RVR. Pt was transferred to ICU for further management.   10/25 - clinically improved, reports less SOB, no overnight events. BM this morning no diarreah.    PAST MEDICAL HISTORY   Past Medical History:  Diagnosis Date  . HFrEF (heart failure with reduced ejection fraction) (HCC)   . Persistent atrial fibrillation (HCC)   . Thrombus of left atrial appendage without antecedent myocardial infarction      SURGICAL HISTORY   Past Surgical History:  Procedure Laterality Date  . CARDIOVERSION N/A 08/08/2018   Procedure: CARDIOVERSION;  Surgeon: Antonieta Iba, MD;  Location: [facility];  Service: Cardiovascular;  Laterality: N/A;  . CARDIOVERSION N/A 11/20/2018   Procedure: CARDIOVERSION;  Surgeon: Yvonne Kendall, MD;  Location: [facility];  Service: Cardiovascular;   Laterality: N/A;  . CARDIOVERSION N/A 11/20/2018   Procedure: CARDIOVERSION;  Surgeon: Yvonne Kendall, MD;  Location: [facility];  Service: Cardiovascular;  Laterality: N/A;  . TEE WITHOUT CARDIOVERSION N/A 08/08/2018   Procedure: TRANSESOPHAGEAL ECHOCARDIOGRAM (TEE);  Surgeon: Antonieta Iba, MD;  Location: [facility];  Service: Cardiovascular;  Laterality: N/A;  . TEE WITHOUT CARDIOVERSION N/A 11/20/2018   Procedure: TRANSESOPHAGEAL ECHOCARDIOGRAM (TEE);  Surgeon: Yvonne Kendall, MD;  Location: [facility];  Service: Cardiovascular;  Laterality: N/A;     FAMILY HISTORY   Family History  Problem Relation Age of Onset  . Emphysema Mother   . Cancer Father      SOCIAL HISTORY   Social History   Tobacco Use  . Smoking status: Never Smoker  . Smokeless tobacco: Never Used  Substance Use Topics  . Alcohol use: Never    Frequency: Never  . Drug use: Never     MEDICATIONS   Current Medication:  Current Facility-Administered Medications:  .  acetaminophen (TYLENOL) tablet 650 mg, 650 mg, Oral, Q6H PRN, 650 mg at 12/01/18 1008 **OR** acetaminophen (TYLENOL) suppository 650 mg, 650 mg, Rectal, Q6H PRN, Cherlynn Kaiser, Rolly Pancake, MD .  [address] amiodarone (NEXTERONE) 1.8 mg/mL load via infusion 150 mg, 150 mg, Intravenous, Once, 150 mg at 11/30/18 1032 **FOLLOWED BY** [EXPIRED] amiodarone (NEXTERONE PREMIX) 360-4.14 MG/200ML-% (1.8 mg/mL) IV infusion, 60 mg/hr, Intravenous, Continuous, Stopped at 11/30/18 2242 **FOLLOWED BY** amiodarone (NEXTERONE PREMIX) 360-4.14 MG/200ML-% (1.8 mg/mL) IV infusion, 60 mg/hr, Intravenous, Continuous, Blakeney, Dana G, NP, Last Rate: 33.3 mL/hr at 12/02/18 0933, 60 mg/hr at 12/02/18 0933 .  apixaban (ELIQUIS) tablet 5 mg, 5 mg, Oral, BID, Debbe Odea, MD .  Chlorhexidine Gluconate Cloth 2 % PADS 6 each, 6 each, Topical, Daily, Kasa, Kurian, MD, 6 each at 11/30/18 1057 .  furosemide (LASIX) injection 80 mg, 80 mg, Intravenous, BID, Flora Lipps, MD, 80  mg at 12/02/18 0937 .  milrinone (PRIMACOR) 20 MG/100 ML (0.2 mg/mL) infusion, 0.25 mcg/kg/min, Intravenous, Continuous, End, Harrell Gave, MD, Stopped at 12/01/18 [phone] .  ondansetron (ZOFRAN) tablet 4 mg, 4 mg, Oral, Q6H PRN **OR** ondansetron (ZOFRAN) injection 4 mg, 4 mg, Intravenous, Q6H PRN, Henreitta Leber, MD, 4 mg at 11/30/18 1834 .  potassium chloride SA (KLOR-CON) CR tablet 40 mEq, 40 mEq, Oral, Once, Benita Gutter, RPH .  promethazine (PHENERGAN) injection 12.5-25 mg, 12.5-25 mg, Intravenous, Q6H PRN, Awilda Bill, NP .  zolpidem (AMBIEN) tablet 5 mg, 5 mg, Oral, QHS PRN, End, Christopher, MD, 5 mg at 12/02/18 0015    ALLERGIES   Patient has no known allergies.    REVIEW OF SYSTEMS    10 point ros done and is negative except as per HPI and subj findings  PHYSICAL EXAMINATION   Vitals:   12/02/18 0500 12/02/18 0900  BP: (!) 87/51 (!) 89/41  Pulse: 96 (!) 28  Resp: 15 17  Temp:  97.9 F (36.6 C)  SpO2: 95% 93%    GENERAL:NAD HEAD: Normocephalic, atraumatic.  EYES: Pupils equal, round, reactive to light.  No scleral icterus.  MOUTH: Moist mucosal membrane. NECK: Supple. No thyromegaly. No nodules. No JVD.  PULMONARY: decreased bs b/l CARDIOVASCULAR: S1 and S2. IRRegular rate and rhythm. No murmurs, rubs, or gallops.  GASTROINTESTINAL: Soft, nontender, non-distended. No masses. Positive bowel sounds. No hepatosplenomegaly.  MUSCULOSKELETAL: No swelling, clubbing, or edema.  NEUROLOGIC: Mild distress due to acute illness SKIN:intact,warm,dry   PERTINENT DATA      Lines / Drains: PIV   Cultures / Sepsis markers: none  Antibiotics: None    Protocols / Consultants: cardiology  Tests / Events: Poss cardioversion  Overnight: improved      Infusions: . amiodarone 60 mg/hr (12/02/18 0933)  . milrinone Stopped (12/01/18 7902)   Scheduled Medications: . apixaban  5 mg Oral BID  . Chlorhexidine Gluconate Cloth  6 each Topical Daily  .  furosemide  80 mg Intravenous BID  . potassium chloride  40 mEq Oral Once   PRN Medications: acetaminophen **OR** acetaminophen, ondansetron **OR** ondansetron (ZOFRAN) IV, promethazine, zolpidem Hemodynamic parameters:   Intake/Output: 10/24 0701 - 10/25 0700 In: 1102.5 [I.V.:1102.5] Out: 4097 [Urine:2425]  Ventilator  Settings:      LAB RESULTS:  Basic Metabolic Panel: Recent Labs  Lab 11/30/18 1048 11/30/18 1823 12/01/18 0507 12/01/18 1353 12/02/18 0532  NA 134* 134* 135  --  136  K 3.5 3.6 3.5  --  3.0*  CL 99 100 100  --  100  CO2 20* 22 24  --  24  GLUCOSE 122* 137* 107*  --  97  BUN 14 15 17   --  16  CREATININE 1.34* 1.46* 1.57* 1.53* 1.47*  CALCIUM 8.6* 8.7* 8.5*  --  8.2*  MG  --  2.2 2.1  --  2.0  PHOS  --  3.5 3.6  --  3.2   Liver Function Tests: Recent Labs  Lab 11/30/18 1048  AST 33  ALT 24  ALKPHOS 87  BILITOT 2.4*  PROT 7.0  ALBUMIN 3.6   No results for input(s): LIPASE, AMYLASE in the last 168 hours. No results for input(s): AMMONIA in the last 168 hours. CBC: Recent Labs  Lab 11/30/18 1048 12/01/18 0507 12/02/18 0532  WBC 8.6 9.2 6.9  NEUTROABS  --   --  3.8  HGB 15.1 14.2 13.6  HCT 46.4 43.6 42.4  MCV 92.8 93.0 92.2  PLT 208 218 166   Cardiac Enzymes: No results for input(s): CKTOTAL, CKMB, CKMBINDEX, TROPONINI in the last 168 hours. BNP: Invalid input(s): POCBNP CBG: Recent Labs  Lab 11/30/18 0956  GLUCAP 98     IMAGING RESULTS:  Imaging: Dg Chest Port 1 View  Result Date: 11/30/2018 CLINICAL DATA:  Shortness of breath. EXAM: PORTABLE CHEST 1 VIEW COMPARISON:  Chest x-ray dated August 06, 2018. FINDINGS: Stable cardiomegaly. Normal pulmonary vascularity. No focal consolidation, pleural effusion, or pneumothorax. No acute osseous abnormality. IMPRESSION: 1. Cardiomegaly.  No active disease. Electronically Signed   By: Obie Dredge M.D.   On: 11/30/2018 11:37         ASSESSMENT AND PLAN      Acute on  chronic decompensated systolic CHF with EF 25-30% -cardiology on case appreciate input -diuresis - lasix 80 bid - strict I&O - patient counselled regarding fluid intake - has large jugs of sweet tea at bedsdie Bisoprolol 2.5 daily and dig 0.25mg  daily  -dig level  ->2400 cc urine output in last 24hr  AF rvr -amio drip -heparin gtt -plan for cardioversion when chf is medically optimized ICU monitoring  Renal Failure acute - stage 1  -close monitoring with aggressive diuresis -follow chem 7 -follow UO -continue Foley Catheter-assess need daily    GI/Nutrition GI PROPHYLAXIS as indicated-currently on heparin gtt DIET-->TF's as tolerated Constipation protocol as indicated  ENDO - ICU hypoglycemic\Hyperglycemia protocol -check FSBS per protocol   ELECTROLYTES -follow labs as needed -replace as needed -pharmacy consultation   DVT/GI PRX ordered -SCDs  TRANSFUSIONS AS NEEDED MONITOR FSBS ASSESS the need for LABS as needed   Critical care provider statement:    Critical care time (minutes):  32   Critical care time was exclusive of:  Separately billable procedures and treating other patients   Critical care was necessary to treat or prevent imminent or life-threatening deterioration of the following conditions:   Acute on chronic systolic CHF, atrial fibrillation with rapid ventricular response, multiple comorbid conditions   Critical care was time spent personally by me on the following activities:  Development of treatment plan with patient or surrogate, discussions with consultants, evaluation of patient's response to treatment, examination of patient, obtaining history from patient or surrogate, ordering and performing treatments and interventions, ordering and review of laboratory studies and re-evaluation of patient's condition.  I assumed direction of critical care for this patient from another provider in my specialty: no    This document was prepared using Dragon  voice recognition software and may include unintentional dictation errors.    Vida Rigger, M.D.  Division of Pulmonary & Critical Care Medicine  [facility]

## 2018-12-02 NOTE — Progress Notes (Addendum)
ANTICOAGULATION CONSULT NOTE  Pharmacy Consult for heparin Indication: atrial fibrillation  No Known Allergies  Patient Measurements: Height: 5\' 8"  (172.7 cm) Weight: 272 lb 4.3 oz (123.5 kg) IBW/kg (Calculated) : 68.4 Heparin Dosing Weight: 96.6 kg  Vital Signs: Temp: 97.4 F (36.3 C) (10/25 0400) Temp Source: Oral (10/25 0400) BP: 87/51 (10/25 0500) Pulse Rate: 96 (10/25 0500)  Labs: Recent Labs    11/30/18 1048  11/30/18 1254  11/30/18 2024 11/30/18 2206 12/01/18 0507 12/01/18 1353 12/01/18 1618 12/02/18 0010 12/02/18 0532 12/02/18 0816  HGB 15.1  --   --   --   --   --  14.2  --   --   --  13.6  --   HCT 46.4  --   --   --   --   --  43.6  --   --   --  42.4  --   PLT 208  --   --   --   --   --  218  --   --   --  166  --   APTT 35  --   --   --   --   --  >160*  --  112* 60*  --  >160*  LABPROT 23.1*  --   --   --   --   --   --   --   --   --   --   --   INR 2.1*  --   --   --   --   --   --   --   --   --   --   --   HEPARINUNFRC >3.60*  --   --   --   --   --   --   --   --   --   --  1.47*  CREATININE 1.34*  --   --    < >  --   --  1.57* 1.53*  --   --  1.47*  --   TROPONINIHS  --    < > 31*  --  36* 30*  --   --   --   --   --   --    < > = values in this interval not displayed.    Estimated Creatinine Clearance: 73.5 mL/min (A) (by C-G formula based on SCr of 1.47 mg/dL (H)).   Medical History: Past Medical History:  Diagnosis Date  . HFrEF (heart failure with reduced ejection fraction) ([place])   . Persistent atrial fibrillation ([place])   . Thrombus of left atrial appendage without antecedent myocardial infarction     Assessment: 53 year old male with afib on Eliquis PTA. Patient admitted from [place] office for worsening SOB, progressive edema and weight gain. Per Cardiologist's recommendation, will hold Eliquis at this time and start heparin drip in case procedure is necessary. Pharmacy consulted for heparin drip management. Patient reports  last dose of Eliquis 10/23 ~ 0800.  Goal of Therapy:  Heparin level 0.3-0.7 units/ml aPTT 66-102 seconds Monitor platelets by anticoagulation protocol: Yes   Plan:  10/25 Will follow APTTs until correlation with HL. Current Heparin drip at 1150 units/hr.   10/25 09:25  aPTT >160.  HL 1.47.  Plan to stop Heparin drip and begin Apixaban.  Eleanor Gatliff K, RPH 12/02/2018,9:28 AM

## 2018-12-02 NOTE — Progress Notes (Signed)
Kenneth Ferguson for Electrolyte Monitoring and Replacement   Recent Labs: Potassium (mmol/L)  Date Value  12/02/2018 3.6   Magnesium (mg/dL)  Date Value  12/02/2018 2.0   Calcium (mg/dL)  Date Value  12/02/2018 8.2 (L)   Albumin (g/dL)  Date Value  11/30/2018 3.6   Phosphorus (mg/dL)  Date Value  12/02/2018 3.2   Sodium (mmol/L)  Date Value  12/02/2018 136     Assessment: 53 year old male with afib on Eliquis PTA. Admitted from [place] office. Patient to start on Lasix 80 mg IV BID per recommendation from Cardiology. Patient normally takes potassium supplement at home with his Lasix. Pharmacy to monitor electrolytes.  Goal of Therapy:  Electrolytes WNL  Plan:  K 3.6 _ No additional replacement needed at this time. Will follow electrolytes with AM labs.    Continue Lasix 80 mg IV BID. Patient may require an increase in potassium supplement if continues on Lasix.  Rowland Lathe, [facility] Clinical Pharmacist 12/02/2018 4:21 PM

## 2018-12-02 NOTE — Progress Notes (Signed)
[place] for Eliquis Indication: atrial fibrillation  No Known Allergies  Patient Measurements: Height: 5\' 8"  (172.7 cm) Weight: 272 lb 4.3 oz (123.5 kg) IBW/kg (Calculated) : 68.4 Heparin Dosing Weight: 96.6 kg  Vital Signs: Temp: 98 F (36.7 C) (10/25 1600) Temp Source: Oral (10/25 1600) BP: 124/88 (10/25 1600) Pulse Rate: 28 (10/25 0900)  Labs: Recent Labs    11/30/18 1048  11/30/18 1254  11/30/18 2024 11/30/18 2206 12/01/18 0507 12/01/18 1353 12/01/18 1618 12/02/18 0010 12/02/18 0532 12/02/18 0816  HGB 15.1  --   --   --   --   --  14.2  --   --   --  13.6  --   HCT 46.4  --   --   --   --   --  43.6  --   --   --  42.4  --   PLT 208  --   --   --   --   --  218  --   --   --  166  --   APTT 35  --   --   --   --   --  >160*  --  112* 60*  --  >160*  LABPROT 23.1*  --   --   --   --   --   --   --   --   --   --   --   INR 2.1*  --   --   --   --   --   --   --   --   --   --   --   HEPARINUNFRC >3.60*  --   --   --   --   --   --   --   --   --   --  1.47*  CREATININE 1.34*  --   --    < >  --   --  1.57* 1.53*  --   --  1.47*  --   TROPONINIHS  --    < > 31*  --  36* 30*  --   --   --   --   --   --    < > = values in this interval not displayed.    Estimated Creatinine Clearance: 73.5 mL/min (A) (by C-G formula based on SCr of 1.47 mg/dL (H)).   Medical History: Past Medical History:  Diagnosis Date  . HFrEF (heart failure with reduced ejection fraction) ([place])   . Persistent atrial fibrillation ([place])   . Thrombus of left atrial appendage without antecedent myocardial infarction     Assessment: 53 year old male with afib on Eliquis PTA. Patient admitted from [place] office for worsening SOB, progressive edema and weight gain. Per Cardiologist's recommendation, will hold Eliquis at this time and start heparin drip in case procedure is necessary. Pharmacy consulted for heparin drip management. Patient reports  last dose of Eliquis 10/23 ~ 0800.  During this admission, patient was on heparin drip. Heparin was stopped today@ 0927. Since that time, patient received first dose of Eliquis today at 1100.   Goal of Therapy:  Monitor platelets by anticoagulation protocol: Yes   Plan:  Will continue Eliquis 5 mg BID. Will follow CBC and Scr every 3 days, per protocol.   Rowland Lathe, RPH 12/02/2018,6:52 PM

## 2018-12-03 DIAGNOSIS — I4819 Other persistent atrial fibrillation: Secondary | ICD-10-CM | POA: Diagnosis not present

## 2018-12-03 DIAGNOSIS — I4891 Unspecified atrial fibrillation: Secondary | ICD-10-CM | POA: Diagnosis not present

## 2018-12-03 DIAGNOSIS — I5020 Unspecified systolic (congestive) heart failure: Secondary | ICD-10-CM | POA: Diagnosis not present

## 2018-12-03 LAB — BASIC METABOLIC PANEL
Anion gap: 11 (ref 5–15)
BUN: 14 mg/dL (ref 6–20)
CO2: 29 mmol/L (ref 22–32)
Calcium: 8.2 mg/dL — ABNORMAL LOW (ref 8.9–10.3)
Chloride: 97 mmol/L — ABNORMAL LOW (ref 98–111)
Creatinine, Ser: 1.27 mg/dL — ABNORMAL HIGH (ref 0.61–1.24)
GFR calc Af Amer: >60 mL/min (ref >60)
GFR calc non Af Amer: >60 mL/min (ref >60)
Glucose, Bld: 96 mg/dL (ref 70–99)
Potassium: 2.7 mmol/L — CL (ref 3.5–5.1)
Sodium: 137 mmol/L (ref 135–145)

## 2018-12-03 LAB — MAGNESIUM: Magnesium: 1.8 mg/dL (ref 1.7–2.4)

## 2018-12-03 MED ORDER — MAGNESIUM SULFATE 2 GM/50ML IV SOLN
2.0000 g | Freq: Once | INTRAVENOUS | Status: AC
  Administered 2018-12-03: 2 g via INTRAVENOUS
  Filled 2018-12-03: qty 50

## 2018-12-03 MED ORDER — POTASSIUM CHLORIDE 10 MEQ/100ML IV SOLN
10.0000 meq | INTRAVENOUS | Status: DC
  Filled 2018-12-03 (×4): qty 100

## 2018-12-03 MED ORDER — DIGOXIN 250 MCG PO TABS
0.2500 mg | ORAL_TABLET | Freq: Four times a day (QID) | ORAL | Status: AC
  Administered 2018-12-03 (×2): 0.25 mg via ORAL
  Filled 2018-12-03 (×2): qty 1

## 2018-12-03 MED ORDER — POTASSIUM CHLORIDE 20 MEQ PO PACK
40.0000 meq | PACK | Freq: Four times a day (QID) | ORAL | Status: DC
  Administered 2018-12-03: 40 meq via ORAL
  Filled 2018-12-03: qty 2

## 2018-12-03 MED ORDER — BISOPROLOL FUMARATE 5 MG PO TABS
2.5000 mg | ORAL_TABLET | Freq: Every day | ORAL | Status: DC
  Administered 2018-12-03 – 2018-12-07 (×5): 2.5 mg via ORAL
  Filled 2018-12-03 (×5): qty 0.5

## 2018-12-03 MED ORDER — POTASSIUM CHLORIDE CRYS ER 20 MEQ PO TBCR
40.0000 meq | EXTENDED_RELEASE_TABLET | Freq: Four times a day (QID) | ORAL | Status: DC
  Administered 2018-12-03 (×2): 40 meq via ORAL
  Filled 2018-12-03 (×2): qty 2

## 2018-12-03 NOTE — Progress Notes (Signed)
Progress Note  Patient Name: Kenneth Ferguson Date of Encounter: 12/03/2018  Primary Cardiologist: Nelva Bush, MD   Subjective   Patient states feeling better much better.  His swelling has improved.  No acute events overnight. Inpatient Medications    Scheduled Meds: . apixaban  5 mg Oral BID  . Chlorhexidine Gluconate Cloth  6 each Topical Daily  . digoxin  0.25 mg Oral Q6H  . furosemide  80 mg Intravenous BID   Continuous Infusions: . milrinone 0.25 mcg/kg/min (12/03/18 3235)  . potassium chloride     PRN Meds: acetaminophen **OR** acetaminophen, ondansetron **OR** ondansetron (ZOFRAN) IV, promethazine, zolpidem   Vital Signs    Vitals:   12/03/18 0455 12/03/18 0500 12/03/18 0600 12/03/18 1000  BP:  (!) 84/61 (!) 87/62 115/69  Pulse:   68 72  Resp:  16 16 20   Temp:      TempSrc:      SpO2: 94%  92% 94%  Weight:  119.9 kg    Height:        Intake/Output Summary (Last 24 hours) at 12/03/2018 1138 Last data filed at 12/03/2018 1004 Gross per 24 hour  Intake 1097.48 ml  Output 5325 ml  Net -4227.52 ml   Last 3 Weights 12/03/2018 12/01/2018 11/30/2018  Weight (lbs) 264 lb 5.3 oz 272 lb 4.3 oz 269 lb 13.5 oz  Weight (kg) 119.9 kg 123.5 kg 122.4 kg      Telemetry    Atrial fibrillation heart rate 116- Personally Reviewed  ECG    Not performed today  Physical Exam   GEN: No acute distress.   Neck: No JVD Cardiac:  Irregular irregular, tachycardic, no murmurs, rubs, or gallops.  Respiratory: Clear to auscultation bilaterally. GI: Soft, nontender, non-distended  MS:  1-2+ edema; No deformity. Neuro:  Nonfocal  Psych: Normal affect   Labs    High Sensitivity Troponin:   Recent Labs  Lab 11/30/18 1052 11/30/18 1254 11/30/18 2024 11/30/18 2206  TROPONINIHS 29* 31* 36* 30*      Chemistry Recent Labs  Lab 11/30/18 1048  12/01/18 0507 12/01/18 1353 12/02/18 0532 12/02/18 1509 12/03/18 0526  NA 134*   < > 135  --  136  --  137  K 3.5    < > 3.5  --  3.0* 3.6 2.7*  CL 99   < > 100  --  100  --  97*  CO2 20*   < > 24  --  24  --  29  GLUCOSE 122*   < > 107*  --  97  --  96  BUN 14   < > 17  --  16  --  14  CREATININE 1.34*   < > 1.57* 1.53* 1.47*  --  1.27*  CALCIUM 8.6*   < > 8.5*  --  8.2*  --  8.2*  PROT 7.0  --   --   --   --   --   --   ALBUMIN 3.6  --   --   --   --   --   --   AST 33  --   --   --   --   --   --   ALT 24  --   --   --   --   --   --   ALKPHOS 87  --   --   --   --   --   --   BILITOT 2.4*  --   --   --   --   --   --  GFRNONAA 60*   < > 49* 51* 53*  --  >60  GFRAA >60   < > 57* 59* >60  --  >60  ANIONGAP 15   < > 11  --  12  --  11   < > = values in this interval not displayed.     Hematology Recent Labs  Lab 11/30/18 1048 12/01/18 0507 12/02/18 0532  WBC 8.6 9.2 6.9  RBC 5.00 4.69 4.60  HGB 15.1 14.2 13.6  HCT 46.4 43.6 42.4  MCV 92.8 93.0 92.2  MCH 30.2 30.3 29.6  MCHC 32.5 32.6 32.1  RDW 15.9* 16.0* 16.1*  PLT 208 218 166    BNPNo results for input(s): BNP, PROBNP in the last 168 hours.   DDimer No results for input(s): DDIMER in the last 168 hours.   Radiology    No results found.  Cardiac Studies   None obtained today  Patient Profile     53 y.o. male with history of heart failure reduced ejection fraction EF 20 to 25%, persistent atrial fibrillation who presents due to fluid overload.  Assessment & Plan    Overall patient feels much better, he is net -4 L over the past 24 hours.  His creatinine has improved to 1.27.  He is still tachycardic, blood pressure is on the low side.  1.  Heart failure reduced ejection fraction, EF 20 to 25%. Patient is net -4 L over the past 24 hours. Continue Lasix at current dose of 80 mg twice daily Continue milrinone    2.    Persistent atrial fibrillation -We will stop amiodarone due to low blood pressures with systolic in the 70s. -Dose digoxin 0.25 mg for 2 doses, 6 hours apart.  Check dig levels in the morning.  -Continue Eliquis Eliquis 5 mg twice daily   Greater than 50% was spent in counseling and coordination of care with patient Total encounter time 35 minutes or more    Signed, Debbe Odea, MD  12/03/2018, 11:38 AM

## 2018-12-03 NOTE — Progress Notes (Signed)
[employer] - [place] [facility]   PATIENT NAME: Kenneth Ferguson    MR#:  [id number]  DATE OF BIRTH:  May 09, 1965  SUBJECTIVE:   Heart rates are still uncontrolled.  Patient denies chest pain, palpitations shortness of breath.  He remains in atrial fibrillation.  It appears that milrinone was turned on yesterday.  REVIEW OF SYSTEMS:    Review of Systems  Constitutional: Negative for fever, chills weight loss HENT: Negative for ear pain, nosebleeds, congestion, facial swelling, rhinorrhea, neck pain, neck stiffness and ear discharge.   Respiratory: Negative for cough, shortness of breath, wheezing  Cardiovascular: Negative for chest pain, palpitations and leg swelling.  Gastrointestinal: Negative for heartburn, abdominal pain, vomiting, diarrhea or consitpation Genitourinary: Negative for dysuria, urgency, frequency, hematuria Musculoskeletal: Negative for back pain or joint pain Neurological: Negative for dizziness, seizures, syncope, focal weakness,  numbness and headaches.  Hematological: Does not bruise/bleed easily.  Psychiatric/Behavioral: Negative for hallucinations, confusion, dysphoric mood    Tolerating Diet: yes      DRUG ALLERGIES:  No Known Allergies  VITALS:  Blood pressure 115/69, pulse 72, temperature 98.3 F (36.8 C), temperature source Oral, resp. rate 20, height 5\' 8"  (1.727 m), weight 119.9 kg, SpO2 94 %.  PHYSICAL EXAMINATION:  Constitutional: Appears well-developed and well-nourished. No distress. HENT: Normocephalic. Oropharynx is clear and moist.  Eyes: Conjunctivae and EOM are normal. PERRLA, no scleral icterus.  Neck: Normal ROM. Neck supple. No JVD. No tracheal deviation. CVS: Tachycardic with irregular irregular heartbeat no murmurs no gallops, no carotid bruit.  Pulmonary: Effort and breath sounds normal, no stridor, rhonchi, wheezes, rales.  Abdominal: Soft. BS +,  no distension, tenderness, rebound or guarding.   Musculoskeletal: Normal range of motion. No edema and no tenderness.  Neuro: Alert. CN 2-12 grossly intact. No focal deficits. Skin: Skin is warm and dry. No rash noted. Psychiatric: Normal mood and affect.      LABORATORY PANEL:   CBC Recent Labs  Lab 12/02/18 0532  WBC 6.9  HGB 13.6  HCT 42.4  PLT 166   ------------------------------------------------------------------------------------------------------------------  Chemistries  Recent Labs  Lab 11/30/18 1048  12/03/18 0526  NA 134*   < > 137  K 3.5   < > 2.7*  CL 99   < > 97*  CO2 20*   < > 29  GLUCOSE 122*   < > 96  BUN 14   < > 14  CREATININE 1.34*   < > 1.27*  CALCIUM 8.6*   < > 8.2*  MG  --    < > 1.8  AST 33  --   --   ALT 24  --   --   ALKPHOS 87  --   --   BILITOT 2.4*  --   --    < > = values in this interval not displayed.   ------------------------------------------------------------------------------------------------------------------  Cardiac Enzymes No results for input(s): TROPONINI in the last 168 hours. ------------------------------------------------------------------------------------------------------------------  RADIOLOGY:  No results found.   ASSESSMENT AND PLAN:   53 year old male with heart failure reduced ejection fraction, persistent atrial fibrillation status post recent cardioversion and left atrial appendage thrombus who presented to the emergency room due to shortness of breath and found to have atrial fibrillation with RVR.   1.  Persistent atrial fibrillation with RVR status post recent TEE guided cardioversion: Patient on milrinone and digoxin. He has been transitioned to oral Eliquis  management as per cardiology    2.  Acute on chronic  systolic heart failure percent:  Consider decreasing dose of Lasix as patient appears to be euvolemic  continue to monitor intake and output with daily weight CHF clinic upon discharge Blood pressure too low for data  blocker/ACE inhibitor/ARB/Entresto   3. Essential hypertension: Blood pressure too low at this moment  4.  Depression with the loss of his wife recently: Continue Lexapro 5.  Hypokalemia from Lasix: Replete and recheck in a.m.  Management plans discussed with the patient and he is in agreement.  CODE STATUS: full  TOTAL TIME TAKING CARE OF THIS PATIENT: 25 minutes.     POSSIBLE D/C 3-4 days, DEPENDING ON CLINICAL CONDITION.   Bettey Costa M.D on 12/03/2018 at 11:13 AM  Between 7am to 6pm - Pager - [phone] After 6pm go to [email or web address] - password [email or web address] [email or web address] [facility]  Office  [phone]  CC: Primary care physician; Patient, [name]  Note: This dictation was prepared with Dragon dictation along with smaller phrase technology. Any transcriptional errors that result from this process are unintentional.

## 2018-12-03 NOTE — Progress Notes (Signed)
Interval note  Patient seen later this evening with son at bedside.  His blood pressure is much improved compared to the morning after stopping the amiodarone drip.  Current blood pressure reading is 121/66.  His heart rates are not well controlled, with current heart rates in the 130s.  He is net -2.7 L so far today (was net -4 L yesterday).  Creatinine is improving with diuresing.  It seems digoxin did not work to help control heart rate.  I will restart his home dose of bisoprolol 2.5 mg daily since his blood pressure is now better.  We will monitor his heart rate and blood pressure and titrate the beta-blocker accordingly.   Signed, Kate Sable, M.D. 12/03/18 [place], [place]

## 2018-12-03 NOTE — Progress Notes (Signed)
[place] for Electrolyte Monitoring and Replacement   Recent Labs: Potassium (mmol/L)  Date Value  12/03/2018 2.7 (LL)   Magnesium (mg/dL)  Date Value  12/02/2018 2.0   Calcium (mg/dL)  Date Value  12/03/2018 8.2 (L)   Albumin (g/dL)  Date Value  11/30/2018 3.6   Phosphorus (mg/dL)  Date Value  12/02/2018 3.2   Sodium (mmol/L)  Date Value  12/03/2018 137   Corrected Ca: 8.52 mg/dL Add-On Magnesium: 1.8 mg/dL  Assessment: 53 year old male with afib on Eliquis PTA. Admitted from [place] office. Patient to start on Lasix 80 mg IV BID per recommendation from Cardiology. Patient normally takes potassium supplement at home with his Lasix. Pharmacy to monitor electrolytes.  Goal of Therapy:  Given Previous Cardiac History: Potassium 4.0 - 5.1 mmol/L Magnesium 2.0 - 2.4 mg/dL All other Electrolytes WNL  Plan:   Replace magnesium with 2 grams IV magnesium sulfate  This patient is refusing IV potassium replacements: replace with 40 mEq QID x 3  BMP, magnesium in am  Dallie Piles, PharmD Clinical Pharmacist 12/03/2018 7:11 AM

## 2018-12-03 NOTE — Progress Notes (Addendum)
CRITICAL CARE NOTE  CC  follow up atrial fibrillation with RVR and worsening HF symptoms   SUBJECTIVE Patient condition is improving - edema in legs has significantly decreased and pt feels better overall since admission. He has no new complaints today.  Pt is hypotensive and tachycardic.      BP 115/69 (BP Location: Right Arm)   Pulse 72   Temp 98.3 F (36.8 C) (Oral)   Resp 20   Ht 5\' 8"  (1.727 m)   Wt 119.9 kg   SpO2 94%   BMI 40.19 kg/m    I/O last 3 completed shifts: In: 2608.2 [P.O.:480; I.V.:2128.2] Out: 6475 [Urine:6475] Total I/O In: -  Out: 300 [Urine:300]  SpO2: 94 %   SIGNIFICANT EVENTS 10/23: Admitted to the ICU for afib with RVR. Started on amiodarone and heparin drip. Given 80 mg lasix IV BID for fluid overload. Echo showed EF of 25-30% and a small-moderate pericardial effusion. Milrinone infusion ordered.  10/24: Pt continued on amiodarone and heparin drip. Cardioversion considered.  10/25 - clinically improved, reports less SOB, no overnight events. BM this morning no diarrhea  10/26 - Pt condition is improving, states he feels better. Urine output was 5500 mL yesterday. Creatinine improving. Pt has been hypotensive and tachycardic. Amiodarone discontinued. Digoxin given this AM. Per nurse, milrinone infusion was not initiated until yesterday, although ordered Friday.   REVIEW OF SYSTEMS  Review of Systems  Constitutional: Negative for chills, diaphoresis and fever.  Respiratory: Negative for shortness of breath.   Cardiovascular: Negative for chest pain, palpitations and leg swelling.  Gastrointestinal: Negative for abdominal pain, nausea and vomiting.  Genitourinary:       Has been urinating frequently due to lasix and fluid overload  Neurological: Negative for sensory change and headaches.     PHYSICAL EXAMINATION:  GENERAL: Pt appears to be in no acute distress. He is sitting on the side of the bed and looks comfortable. HEAD:  Normocephalic, atraumatic.  EYES: Pupils equal, round, reactive to light.  No scleral icterus.  MOUTH: Moist mucosal membrane. NECK: Supple.  PULMONARY: CTA bilaterally  CARDIOVASCULAR: S1 and S2. irregular rate and rhythm. No murmurs, rubs, or gallops.  GASTROINTESTINAL: Soft, nontender, -distended. No masses. Positive bowel sounds. No hepatosplenomegaly.  MUSCULOSKELETAL: No swelling, clubbing, or edema.  NEUROLOGIC: A+Ox4.  SKIN:intact,warm,dry  MEDICATIONS: I have reviewed all medications and confirmed regimen as documented   CULTURE RESULTS   Recent Results (from the past 240 hour(s))  MRSA PCR Screening     Status: None   Collection Time: 11/30/18 10:05 AM   Specimen: Nasopharyngeal  Result Value Ref Range Status   MRSA by PCR NEGATIVE NEGATIVE Final    Comment:        The GeneXpert MRSA Assay (FDA approved for NASAL specimens only), is one component of a comprehensive MRSA colonization surveillance program. It is not intended to diagnose MRSA infection nor to guide or monitor treatment for MRSA infections. Performed at [facility], [address] [place], [place] [address]   SARS Coronavirus 2 by RT PCR (hospital order, performed in [facility] hospital lab)     Status: None   Collection Time: 11/30/18 11:17 AM  Result Value Ref Range Status   SARS Coronavirus 2 NEGATIVE NEGATIVE Final    Comment: (NOTE) If result is NEGATIVE SARS-CoV-2 target nucleic acids are NOT DETECTED. The SARS-CoV-2 RNA is generally detectable in upper and lower  respiratory specimens during the acute phase of infection. The lowest  concentration  of SARS-CoV-2 viral copies this assay can detect is 250  copies / mL. A negative result does not preclude SARS-CoV-2 infection  and should not be used as the sole basis for treatment or other  patient management decisions.  A negative result may occur with  improper specimen collection / handling, submission of specimen other  than  nasopharyngeal swab, presence of viral mutation(s) within the  areas targeted by this assay, and inadequate number of viral copies  (<250 copies / mL). A negative result must be combined with clinical  observations, patient history, and epidemiological information. If result is POSITIVE SARS-CoV-2 target nucleic acids are DETECTED. The SARS-CoV-2 RNA is generally detectable in upper and lower  respiratory specimens dur ing the acute phase of infection.  Positive  results are indicative of active infection with SARS-CoV-2.  Clinical  correlation with patient history and other diagnostic information is  necessary to determine patient infection status.  Positive results do  not rule out bacterial infection or co-infection with other viruses. If result is PRESUMPTIVE POSTIVE SARS-CoV-2 nucleic acids MAY BE PRESENT.   A presumptive positive result was obtained on the submitted specimen  and confirmed on repeat testing.  While 2019 novel coronavirus  (SARS-CoV-2) nucleic acids may be present in the submitted sample  additional confirmatory testing may be necessary for epidemiological  and / or clinical management purposes  to differentiate between  SARS-CoV-2 and other Sarbecovirus currently known to infect humans.  If clinically indicated additional testing with an alternate test  methodology [phone]) is advised. The SARS-CoV-2 RNA is generally  detectable in upper and lower respiratory sp ecimens during the acute  phase of infection. The expected result is Negative. Fact Sheet for Patients:  StrictlyIdeas.no Fact Sheet for Healthcare Providers: BankingDealers.co.za This test is not yet approved or cleared by the [place] FDA and has been authorized for detection and/or diagnosis of SARS-CoV-2 by FDA under an Emergency Use Authorization (EUA).  This EUA will remain in effect (meaning this test can be used) for the duration of  the COVID-19 declaration under Section 564(b)(1) of the Act, 21 U.S.C. section 360bbb-3(b)(1), unless the authorization is terminated or revoked sooner. Performed at [facility], [address] [place], [place] [address]         CBC    Component Value Date/Time   WBC 6.9 12/02/2018 0532   RBC 4.60 12/02/2018 0532   HGB 13.6 12/02/2018 0532   HCT 42.4 12/02/2018 0532   PLT 166 12/02/2018 0532   MCV 92.2 12/02/2018 0532   MCH 29.6 12/02/2018 0532   MCHC 32.1 12/02/2018 0532   RDW 16.1 (H) 12/02/2018 0532   LYMPHSABS 2.3 12/02/2018 0532   MONOABS 0.7 12/02/2018 0532   EOSABS 0.1 12/02/2018 0532   BASOSABS 0.0 12/02/2018 0532   BMP Latest Ref Rng & Units 12/03/2018 12/02/2018 12/02/2018  Glucose 70 - 99 mg/dL 96 - 97  BUN 6 - 20 mg/dL 14 - 16  Creatinine 0.61 - 1.24 mg/dL 1.27(H) - 1.47(H)  Sodium 135 - 145 mmol/L 137 - 136  Potassium 3.5 - 5.1 mmol/L 2.7(LL) 3.6 3.0(L)  Chloride 98 - 111 mmol/L 97(L) - 100  CO2 22 - 32 mmol/L 29 - 24  Calcium 8.9 - 10.3 mg/dL 8.2(L) - 8.2(L)       ASSESSMENT AND PLAN SYNOPSIS  Atrial Fibrillation with RVR -Discontinue amiodarone. Continue digoxin.  -Continue Eliquis for anticoagulation  -Monitor on EKG     Acute HF with rEF with EF  20-30%  Continue Milrinone infusion as per cardiology -oxygen as needed -Lasix 80 mg BID IV as tolerated  -follow up cardiac enzymes as indicated -follow up cardiology recs - appreciate cardiology consults  -monitor urine output    CARDIAC -ICU monitoring  GI GI PROPHYLAXIS as indicated  NUTRITIONAL STATUS DIET--> Cardiac diet as tolerated  Constipation protocol as indicated   ENDO - will use ICU hypoglycemic\Hyperglycemia protocol if needed  Psych -continue patient on lexapro   ELECTROLYTES -follow labs as needed -replace as needed -pharmacy consultation and following   DVT/GI PRX ordered TRANSFUSIONS AS NEEDED MONITOR FSBS ASSESS the need for LABS    Overall, patient's acute condition is improving. Will need cardiology follow up for further medication management and possible repeat cardioversion.    Lucie Leather, M.D.  [facility]  Medical Director [facility] Medical Director [facility] Cardio-Pulmonary Department

## 2018-12-04 DIAGNOSIS — I5023 Acute on chronic systolic (congestive) heart failure: Secondary | ICD-10-CM | POA: Diagnosis not present

## 2018-12-04 DIAGNOSIS — I4891 Unspecified atrial fibrillation: Secondary | ICD-10-CM | POA: Diagnosis not present

## 2018-12-04 LAB — CBC WITH DIFFERENTIAL/PLATELET
Abs Immature Granulocytes: 0.02 10*3/uL (ref 0.00–0.07)
Basophils Absolute: 0 10*3/uL (ref 0.0–0.1)
Basophils Relative: 0 %
Eosinophils Absolute: 0.1 10*3/uL (ref 0.0–0.5)
Eosinophils Relative: 2 %
HCT: 43.8 % (ref 39.0–52.0)
Hemoglobin: 14.4 g/dL (ref 13.0–17.0)
Immature Granulocytes: 0 %
Lymphocytes Relative: 26 %
Lymphs Abs: 1.8 10*3/uL (ref 0.7–4.0)
MCH: 30 pg (ref 26.0–34.0)
MCHC: 32.9 g/dL (ref 30.0–36.0)
MCV: 91.3 fL (ref 80.0–100.0)
Monocytes Absolute: 0.9 10*3/uL (ref 0.1–1.0)
Monocytes Relative: 13 %
Neutro Abs: 4 10*3/uL (ref 1.7–7.7)
Neutrophils Relative %: 59 %
Platelets: 209 10*3/uL (ref 150–400)
RBC: 4.8 MIL/uL (ref 4.22–5.81)
RDW: 15.6 % — ABNORMAL HIGH (ref 11.5–15.5)
WBC: 6.8 10*3/uL (ref 4.0–10.5)
nRBC: 0 % (ref 0.0–0.2)

## 2018-12-04 LAB — BASIC METABOLIC PANEL
Anion gap: 8 (ref 5–15)
BUN: 11 mg/dL (ref 6–20)
CO2: 32 mmol/L (ref 22–32)
Calcium: 8.7 mg/dL — ABNORMAL LOW (ref 8.9–10.3)
Chloride: 97 mmol/L — ABNORMAL LOW (ref 98–111)
Creatinine, Ser: 1.29 mg/dL — ABNORMAL HIGH (ref 0.61–1.24)
GFR calc Af Amer: >60 mL/min (ref >60)
GFR calc non Af Amer: >60 mL/min (ref >60)
Glucose, Bld: 96 mg/dL (ref 70–99)
Potassium: 3.2 mmol/L — ABNORMAL LOW (ref 3.5–5.1)
Sodium: 137 mmol/L (ref 135–145)

## 2018-12-04 LAB — DIGOXIN LEVEL: Digoxin Level: 0.9 ng/mL (ref 0.8–2.0)

## 2018-12-04 LAB — POTASSIUM: Potassium: 3.7 mmol/L (ref 3.5–5.1)

## 2018-12-04 LAB — MAGNESIUM: Magnesium: 2 mg/dL (ref 1.7–2.4)

## 2018-12-04 MED ORDER — AMIODARONE HCL 200 MG PO TABS
200.0000 mg | ORAL_TABLET | Freq: Two times a day (BID) | ORAL | Status: DC
  Administered 2018-12-04 – 2018-12-07 (×7): 200 mg via ORAL
  Filled 2018-12-04 (×7): qty 1

## 2018-12-04 MED ORDER — POTASSIUM CHLORIDE CRYS ER 20 MEQ PO TBCR
20.0000 meq | EXTENDED_RELEASE_TABLET | Freq: Once | ORAL | Status: AC
  Administered 2018-12-04: 16:00:00 20 meq via ORAL
  Filled 2018-12-04: qty 1

## 2018-12-04 MED ORDER — DIGOXIN 125 MCG PO TABS
0.1250 mg | ORAL_TABLET | Freq: Every day | ORAL | Status: DC
  Administered 2018-12-04 – 2018-12-07 (×4): 0.125 mg via ORAL
  Filled 2018-12-04 (×4): qty 1

## 2018-12-04 MED ORDER — POTASSIUM CHLORIDE CRYS ER 20 MEQ PO TBCR
30.0000 meq | EXTENDED_RELEASE_TABLET | ORAL | Status: AC
  Administered 2018-12-04 (×2): 30 meq via ORAL
  Filled 2018-12-04 (×2): qty 2

## 2018-12-04 MED ORDER — SPIRONOLACTONE 25 MG PO TABS
25.0000 mg | ORAL_TABLET | Freq: Every day | ORAL | Status: DC
  Administered 2018-12-04 – 2018-12-07 (×4): 25 mg via ORAL
  Filled 2018-12-04 (×4): qty 1

## 2018-12-04 NOTE — Progress Notes (Signed)
Progress Note  Patient Name: Kenneth Ferguson Date of Encounter: 12/04/2018  Primary Cardiologist: Yvonne Kendall, MD   Subjective   Pt feels much better with minimal LE edema.  No chest pain or shortness of breath.  Nausea resolved.  Able to lie flat.  Wanted to go home.  Inpatient Medications    Scheduled Meds: . amiodarone  200 mg Oral BID  . apixaban  5 mg Oral BID  . bisoprolol  2.5 mg Oral Daily  . Chlorhexidine Gluconate Cloth  6 each Topical Daily  . digoxin  0.125 mg Oral Daily  . furosemide  80 mg Intravenous BID  . potassium chloride  30 mEq Oral Q4H  . spironolactone  25 mg Oral Daily   Continuous Infusions: . milrinone 0.25 mcg/kg/min (12/04/18 0631)   PRN Meds: acetaminophen **OR** acetaminophen, ondansetron **OR** ondansetron (ZOFRAN) IV, promethazine, zolpidem   Vital Signs    Vitals:   12/04/18 0300 12/04/18 0400 12/04/18 0500 12/04/18 0600  BP:      Pulse: 63 (!) 53 76 93  Resp: 17 16 17 16   Temp:      TempSrc:      SpO2: 92% 90% 93% 92%  Weight:   112.2 kg   Height:        Intake/Output Summary (Last 24 hours) at 12/04/2018 0849 Last data filed at 12/04/2018 0631 Gross per 24 hour  Intake 1254.12 ml  Output 6150 ml  Net -4895.88 ml   Last 3 Weights 12/04/2018 12/03/2018 12/01/2018  Weight (lbs) 247 lb 5.7 oz 264 lb 5.3 oz 272 lb 4.3 oz  Weight (kg) 112.2 kg 119.9 kg 123.5 kg      Telemetry    A-fib with RVR (100-150 bpm) with PVC's versus aberrancy and brief NSVT - Personally Reviewed  ECG    No new tracing  Physical Exam   GEN: No acute distress.   Neck: JVP ~10 cm Cardiac: Tachycardic and irregularly irregular Respiratory: Clear to auscultation bilaterally. GI: Soft, nontender, non-distended  MS: 1+ ankle edema bilaterally Neuro:  Nonfocal  Psych: Normal affect   Labs    High Sensitivity Troponin:   Recent Labs  Lab 11/30/18 1052 11/30/18 1254 11/30/18 2024 11/30/18 2206  TROPONINIHS 29* 31* 36* 30*       Chemistry Recent Labs  Lab 11/30/18 1048  12/02/18 0532 12/02/18 1509 12/03/18 0526 12/04/18 0452  NA 134*   < > 136  --  137 137  K 3.5   < > 3.0* 3.6 2.7* 3.2*  CL 99   < > 100  --  97* 97*  CO2 20*   < > 24  --  29 32  GLUCOSE 122*   < > 97  --  96 96  BUN 14   < > 16  --  14 11  CREATININE 1.34*   < > 1.47*  --  1.27* 1.29*  CALCIUM 8.6*   < > 8.2*  --  8.2* 8.7*  PROT 7.0  --   --   --   --   --   ALBUMIN 3.6  --   --   --   --   --   AST 33  --   --   --   --   --   ALT 24  --   --   --   --   --   ALKPHOS 87  --   --   --   --   --  BILITOT 2.4*  --   --   --   --   --   GFRNONAA 60*   < > 53*  --  >60 >60  GFRAA >60   < > >60  --  >60 >60  ANIONGAP 15   < > 12  --  11 8   < > = values in this interval not displayed.     Hematology Recent Labs  Lab 12/01/18 0507 12/02/18 0532 12/04/18 0452  WBC 9.2 6.9 6.8  RBC 4.69 4.60 4.80  HGB 14.2 13.6 14.4  HCT 43.6 42.4 43.8  MCV 93.0 92.2 91.3  MCH 30.3 29.6 30.0  MCHC 32.6 32.1 32.9  RDW 16.0* 16.1* 15.6*  PLT 218 166 209    BNPNo results for input(s): BNP, PROBNP in the last 168 hours.   DDimer No results for input(s): DDIMER in the last 168 hours.   Radiology    No results found.  Cardiac Studies   Limited echo (11/30/2018):  1. Left ventricular ejection fraction, by visual estimation, is 25 to 30%. The left ventricle has moderate to severely decreased function. Normal left ventricular size. There is moderately increased left ventricular hypertrophy.Severe hypokinesis of the  anterior, anteroseptal and apical walls  2. Global right ventricle has moderately reduced systolic function.The right ventricular size is normal. No increase in right ventricular wall thickness.  3. Left atrial size was moderately dilated.  4. Mildly elevated pulmonary artery systolic pressure.  5. Arrhythmia noted, rate 133 bpm  Patient Profile     53 y.o. male with history of persistent atrial fibrillation, HFrEF of uncertain  etiology, and questionable HCM, admitted with acute on chronic HFrEF in the setting of atrial fibrillation with rapid ventricular response.  Assessment & Plan    Atrial fibrillation with rapid ventricular response: Ventricular rates still suboptimally controlled despite further digoxin loading yesterday and reinitiation of low-dose bisoprolol.  Amiodarone gtt stopped yesterday due to low blood pressure.  Continue digoxin 0.125 mg daily.  Continue bisoprolol 2.5 mg daily.  Start amiodarone 200 mg BID; patient had recurrent a-fib following cardioversion earlier this month and will need antiarrhythmic therapy in an effort to maintain sinus rhythm.  Wean milrinone.  Once off milrinone and euvolemic, patient will need repeat DCCV (likely Thursday or Friday).  Continue apixaban 5 mg BID.  Acute on chronic HFrEF: Volume status much improved since last week with robust urine output.  Patient is down 25 pounds since admission.  Nausea resolved.  Some LE edema and JVD still present.  Renal function also stable, suggesting further diuresis is still possible.  Continue furosemide 80 mg IV BID.  Decrease milrinone to 0.125 mcg/kg/min today.  Add spironolactone 25 mg daily.  Continue bisoprolol 2.5 mg daily.  Maintain digoxin 0.125 mg daily.  If blood pressure tolerates, consider trial of ACEI/ARB in the coming days.  Replete potassium for goal > 4.0.  Importance of aggressive evidence-based HF therapy and volume management were discussed with the patient.  Though he is eager to go home, I advised him that he will likely need at least a few more days in the hospital to optimize his heart failure and atrial fibrillation.   For questions or updates, please contact [place] Please consult [email or web address] for contact info under [facility] Cardiology.     Signed, Nelva Bush, MD  12/04/2018, 8:49 AM

## 2018-12-04 NOTE — Progress Notes (Addendum)
CRITICAL CARE NOTE  CC  follow up atrial fibrillation with RVR and worsening HF symptoms   SUBJECTIVE Patient condition is improving - edema in legs has significantly decreased and pt feels better overall since admission.  Pt's hypotension is resolving, but he remains tachycardic in the 120s-130s.    BP (!) 89/51 (BP Location: Right Arm)   Pulse 93   Temp 97.9 F (36.6 C) (Oral)   Resp 20   Ht 5\' 8"  (1.727 m)   Wt 112.2 kg   SpO2 95%   BMI 37.61 kg/m    I/O last 3 completed shifts: In: 2168.7 [P.O.:1200; I.V.:918.7; IV Piggyback:50] Out: 8050 [Urine:8050] Total I/O In: 493.6 [P.O.:480; I.V.:13.6] Out: -   SpO2: 95 %   SIGNIFICANT EVENTS 10/23: Admitted to the ICU for afib with RVR. Started on amiodarone and heparin drip. Given 80 mg lasix IV BID for fluid overload. Echo showed EF of 25-30% and a small-moderate pericardial effusion. Milrinone infusion ordered.  10/24: Pt continued on amiodarone and heparin drip. Cardioversion considered.  10/25 - clinically improved, reports less SOB, no overnight events. BM this morning no diarrhea  10/26 - Pt condition is improving, states he feels better. Urine output was 5500 mL yesterday. Creatinine improving. Pt has been hypotensive and tachycardic. Amiodarone discontinued. Digoxin given this AM. Per nurse, milrinone infusion was not initiated until yesterday, although ordered Friday.  10/27 - Pt's heart failure symptoms have been improving. His hypotension has also been improving, but rate is still elevated. Cardiology restarted his bisoprolol, and started him on oral amiodarone and digoxin daily to try and control his rate. Cardioversion considered for this Thursday or Friday. BUN and creatinine improving.   REVIEW OF SYSTEMS  Review of Systems  Constitutional: Positive for weight loss (has lost 25 pounds of fluid since admission 3 days ago). Negative for chills and fever.  Eyes: Negative for blurred vision.  Respiratory: Negative  for cough and shortness of breath.   Cardiovascular: Negative for chest pain, palpitations and leg swelling.  Gastrointestinal: Negative for abdominal pain, constipation, diarrhea, nausea and vomiting.  Genitourinary:       Has been urinating frequently due to lasix treatment and volume overload  Neurological: Negative for sensory change and headaches.     PHYSICAL EXAMINATION:  GENERAL: Pt appears to be in no acute distress and looks comfortable HEAD: Normocephalic, atraumatic.  EYES: Pupils equal, round, reactive to light.  No scleral icterus.  MOUTH: Moist mucosal membrane. NECK: Supple.  PULMONARY: CTA bilaterally  CARDIOVASCULAR: S1 and S2. irregular rate and rhythm. No murmurs, rubs, or gallops.  GASTROINTESTINAL: Soft, nontender, -distended. No masses. Positive bowel sounds. No hepatosplenomegaly.  MUSCULOSKELETAL: No swelling, clubbing, or edema. NEUROLOGIC: A+Ox4.  SKIN:intact,warm,dry PSYCH: Flat affect, frustrated and depressed with situation.   MEDICATIONS: I have reviewed all medications and confirmed regimen as documented   CULTURE RESULTS   Recent Results (from the past 240 hour(s))  MRSA PCR Screening     Status: None   Collection Time: 11/30/18 10:05 AM   Specimen: Nasopharyngeal  Result Value Ref Range Status   MRSA by PCR NEGATIVE NEGATIVE Final    Comment:        The GeneXpert MRSA Assay (FDA approved for NASAL specimens only), is one component of a comprehensive MRSA colonization surveillance program. It is not intended to diagnose MRSA infection nor to guide or monitor treatment for MRSA infections. Performed at [facility], [address] [place], [place]   SARS Coronavirus 2  by RT PCR (hospital order, performed in [facility] hospital lab)     Status: None   Collection Time: 11/30/18 11:17 AM  Result Value Ref Range Status   SARS Coronavirus 2 NEGATIVE NEGATIVE Final    Comment: (NOTE) If result is NEGATIVE SARS-CoV-2  target nucleic acids are NOT DETECTED. The SARS-CoV-2 RNA is generally detectable in upper and lower  respiratory specimens during the acute phase of infection. The lowest  concentration of SARS-CoV-2 viral copies this assay can detect is 250  copies / mL. A negative result does not preclude SARS-CoV-2 infection  and should not be used as the sole basis for treatment or other  patient management decisions.  A negative result may occur with  improper specimen collection / handling, submission of specimen other  than nasopharyngeal swab, presence of viral mutation(s) within the  areas targeted by this assay, and inadequate number of viral copies  (<250 copies / mL). A negative result must be combined with clinical  observations, patient history, and epidemiological information. If result is POSITIVE SARS-CoV-2 target nucleic acids are DETECTED. The SARS-CoV-2 RNA is generally detectable in upper and lower  respiratory specimens dur ing the acute phase of infection.  Positive  results are indicative of active infection with SARS-CoV-2.  Clinical  correlation with patient history and other diagnostic information is  necessary to determine patient infection status.  Positive results do  not rule out bacterial infection or co-infection with other viruses. If result is PRESUMPTIVE POSTIVE SARS-CoV-2 nucleic acids MAY BE PRESENT.   A presumptive positive result was obtained on the submitted specimen  and confirmed on repeat testing.  While 2019 novel coronavirus  (SARS-CoV-2) nucleic acids may be present in the submitted sample  additional confirmatory testing may be necessary for epidemiological  and / or clinical management purposes  to differentiate between  SARS-CoV-2 and other Sarbecovirus currently known to infect humans.  If clinically indicated additional testing with an alternate test  methodology [phone]) is advised. The SARS-CoV-2 RNA is generally  detectable in upper and lower  respiratory sp ecimens during the acute  phase of infection. The expected result is Negative. Fact Sheet for Patients:  BoilerBrush.com.cy Fact Sheet for Healthcare Providers: https://pope.com/ This test is not yet approved or cleared by the [place] FDA and has been authorized for detection and/or diagnosis of SARS-CoV-2 by FDA under an Emergency Use Authorization (EUA).  This EUA will remain in effect (meaning this test can be used) for the duration of the COVID-19 declaration under Section 564(b)(1) of the Act, 21 U.S.C. section 360bbb-3(b)(1), unless the authorization is terminated or revoked sooner. Performed at [facility], [address] [place], [place] [address]    CBC    Component Value Date/Time   WBC 6.8 12/04/2018 0452   RBC 4.80 12/04/2018 0452   HGB 14.4 12/04/2018 0452   HCT 43.8 12/04/2018 0452   PLT 209 12/04/2018 0452   MCV 91.3 12/04/2018 0452   MCH 30.0 12/04/2018 0452   MCHC 32.9 12/04/2018 0452   RDW 15.6 (H) 12/04/2018 0452   LYMPHSABS 1.8 12/04/2018 0452   MONOABS 0.9 12/04/2018 0452   EOSABS 0.1 12/04/2018 0452   BASOSABS 0.0 12/04/2018 0452   CMP     Component Value Date/Time   NA 137 12/04/2018 0452   K 3.2 (L) 12/04/2018 0452   CL 97 (L) 12/04/2018 0452   CO2 32 12/04/2018 0452   GLUCOSE 96 12/04/2018 0452   BUN 11 12/04/2018 0452  CREATININE 1.29 (H) 12/04/2018 0452   CALCIUM 8.7 (L) 12/04/2018 0452   PROT 7.0 11/30/2018 1048   ALBUMIN 3.6 11/30/2018 1048   AST 33 11/30/2018 1048   ALT 24 11/30/2018 1048   ALKPHOS 87 11/30/2018 1048   BILITOT 2.4 (H) 11/30/2018 1048   GFRNONAA >60 12/04/2018 0452   GFRAA >60 12/04/2018 0452       ASSESSMENT AND PLAN SYNOPSIS  Atrial Fibrillation with RVR -Continue oral amiodarone, digoxin and bisoprolol as recommended by cardiology  -Continue Eliquis for anticoagulation  -Monitor on EKG    Acute HF with rEF with EF  20-30%  -begin to wean milrinone drip  -oxygen as needed -Lasix80 mg BID IV as tolerated -follow up cardiac enzymes as indicated -follow up cardiology recs - appreciate cardiology consults  -monitor urine output- 6 L urine output yesterday    CARDIAC -ICU monitoring  GI GI PROPHYLAXIS as indicated  NUTRITIONAL STATUS DIET--> Cardiac diet as tolerated Constipation protocol as indicated   ENDO - will use ICU hypoglycemic\Hyperglycemia protocol if needed  Psych -continue patient on lexapro   ELECTROLYTES -follow labs as needed -replace as needed - continue potassium supplementation  -pharmacy consultation and following   DVT/GI PRX ordered TRANSFUSIONS AS NEEDED MONITOR FSBS ASSESS the need for LABS  Overall, patient's acute condition is improving. Will need cardiology follow up for further medication management and possible repeat cardioversion.     Corrin Parker, M.D.  [facility]  Medical Director [place] Director [facility] Cardio-Pulmonary Department

## 2018-12-04 NOTE — Progress Notes (Signed)
[place] [facility] [place] NAME: Kenneth Ferguson    MR#:  [id number]  DATE OF BIRTH:  09/11/1965  SUBJECTIVE:   Still working on fluid balance and heart rate  REVIEW OF SYSTEMS:    Review of Systems  Constitutional: Negative for fever, chills weight loss HENT: Negative for ear pain, nosebleeds, congestion, facial swelling, rhinorrhea, neck pain, neck stiffness and ear discharge.   Respiratory: Negative for cough, shortness of breath, wheezing  Cardiovascular: Negative for chest pain, palpitations and leg swelling.  Gastrointestinal: Negative for heartburn, abdominal pain, vomiting, diarrhea or consitpation Genitourinary: Negative for dysuria, urgency, frequency, hematuria Musculoskeletal: Negative for back pain or joint pain Neurological: Negative for dizziness, seizures, syncope, focal weakness,  numbness and headaches.  Hematological: Does not bruise/bleed easily.  Psychiatric/Behavioral: Negative for hallucinations, confusion, dysphoric mood    Tolerating Diet: yes      DRUG ALLERGIES:  No Known Allergies  VITALS:  Blood pressure (!) 89/51, pulse 93, temperature 97.9 F (36.6 C), temperature source Oral, resp. rate 20, height 5\' 8"  (1.727 m), weight 112.2 kg, SpO2 95 %.  PHYSICAL EXAMINATION:  Constitutional: Appears well-developed and well-nourished. No distress. HENT: Normocephalic. Marland Kitchen Oropharynx is clear and moist.  Eyes: Conjunctivae and EOM are normal. PERRLA, no scleral icterus.  Neck: Normal ROM. Neck supple. No JVD. No tracheal deviation. CVS: Tachycardic with irregular irregular heartbeat no murmurs no gallops, no carotid bruit.  Pulmonary: Effort and breath sounds normal, no stridor, rhonchi, wheezes, rales.  Abdominal: Soft. BS +,  no distension, tenderness, rebound or guarding.  Musculoskeletal: Normal range of motion. No edema and no tenderness.  Neuro: Alert. CN 2-12 grossly intact. No focal deficits. Skin: Skin is warm  and dry. No rash noted. Psychiatric: Normal mood and affect.      LABORATORY PANEL:   CBC Recent Labs  Lab 12/04/18 0452  WBC 6.8  HGB 14.4  HCT 43.8  PLT 209   ------------------------------------------------------------------------------------------------------------------  Chemistries  Recent Labs  Lab 11/30/18 1048  12/04/18 0452  NA 134*   < > 137  K 3.5   < > 3.2*  CL 99   < > 97*  CO2 20*   < > 32  GLUCOSE 122*   < > 96  BUN 14   < > 11  CREATININE 1.34*   < > 1.29*  CALCIUM 8.6*   < > 8.7*  MG  --    < > 2.0  AST 33  --   --   ALT 24  --   --   ALKPHOS 87  --   --   BILITOT 2.4*  --   --    < > = values in this interval not displayed.   ------------------------------------------------------------------------------------------------------------------  Cardiac Enzymes No results for input(s): TROPONINI in the last 168 hours. ------------------------------------------------------------------------------------------------------------------  RADIOLOGY:  No results found.   ASSESSMENT AND PLAN:   53 year old male with heart failure reduced ejection fraction, persistent atrial fibrillation status post recent cardioversion and left atrial appendage thrombus who presented to the emergency room due to shortness of breath and found to have atrial fibrillation with RVR.   1.  Persistent atrial fibrillation with RVR status post recent TEE guided cardioversion: Patient transitioned to PO Amio Continue digoxin and bisoprolol Continue Eliquis for CVA prevention Cardioversion planned for Thursday  2.  Acute on chronic systolic heart failure EF 25-30%:  Continue Milrinone and  Lasix  continue to monitor intake and output with daily weight  CHF clinic upon discharge Add Aldactone today. If blood pressure tolerates, consider trial of ACEI/ARB.  3. Essential hypertension: Blood pressure better this am Restarted on Bisoprolol and started on Aldactone Continue to  monitor  4.  Depression with the loss of his wife recently: Continue Lexapro 5.  Hypokalemia from Lasix: Replete and recheck in a.m.  Management plans discussed with the patient and he is in agreement. D/w dr end  CODE STATUS: full  TOTAL TIME TAKING CARE OF THIS PATIENT: 25 minutes.     POSSIBLE D/C Friday, DEPENDING ON CLINICAL CONDITION.   Adrian Saran M.D on 12/04/2018 at 12:02 PM  Between 7am to 6pm - Pager - [phone] After 6pm go to [email or web address] - password [email or web address]  [facility]  Office  [phone]  CC: Primary care physician; Patient, [name]  Note: This dictation was prepared with Dragon dictation along with smaller phrase technology. Any transcriptional errors that result from this process are unintentional.

## 2018-12-04 NOTE — Progress Notes (Signed)
[place] for Electrolyte Monitoring and Replacement   Recent Labs: Potassium (mmol/L)  Date Value  12/04/2018 3.2 (L)   Magnesium (mg/dL)  Date Value  12/04/2018 2.0   Calcium (mg/dL)  Date Value  12/04/2018 8.7 (L)   Albumin (g/dL)  Date Value  11/30/2018 3.6   Phosphorus (mg/dL)  Date Value  12/02/2018 3.2   Sodium (mmol/L)  Date Value  12/04/2018 137   Corrected Ca: 9.02 mg/dL  Follow-Up Potassium: 3.7 mmol/L at 1155  Assessment: 53 year old male with afib on Eliquis PTA. Admitted from [place] office. Patient to start on Lasix 80 mg IV BID per recommendation from Cardiology. Patient normally takes potassium supplement at home with his Lasix. Pharmacy to monitor electrolytes.  Goal of Therapy:  Given Previous Cardiac History: Potassium 4.0 - 5.1 mmol/L Magnesium 2.0 - 2.4 mg/dL All other Electrolytes WNL  Plan:    This patient is still refusing IV potassium replacements: he received a total of 60 mEq oral KCl and the level is now just below goal: replace with an additional 20 mEq oral KCl  Spironolactone was started this morning which can increase potassium levels   BMP, magnesium in am  Dallie Piles, PharmD Clinical Pharmacist 12/04/2018 8:26 AM

## 2018-12-05 DIAGNOSIS — I4891 Unspecified atrial fibrillation: Secondary | ICD-10-CM | POA: Diagnosis not present

## 2018-12-05 LAB — CBC
HCT: 45.6 % (ref 39.0–52.0)
Hemoglobin: 15.1 g/dL (ref 13.0–17.0)
MCH: 30 pg (ref 26.0–34.0)
MCHC: 33.1 g/dL (ref 30.0–36.0)
MCV: 90.5 fL (ref 80.0–100.0)
Platelets: 211 10*3/uL (ref 150–400)
RBC: 5.04 MIL/uL (ref 4.22–5.81)
RDW: 15.3 % (ref 11.5–15.5)
WBC: 7.1 10*3/uL (ref 4.0–10.5)
nRBC: 0 % (ref 0.0–0.2)

## 2018-12-05 LAB — BASIC METABOLIC PANEL
Anion gap: 12 (ref 5–15)
BUN: 15 mg/dL (ref 6–20)
CO2: 30 mmol/L (ref 22–32)
Calcium: 9.2 mg/dL (ref 8.9–10.3)
Chloride: 96 mmol/L — ABNORMAL LOW (ref 98–111)
Creatinine, Ser: 1.38 mg/dL — ABNORMAL HIGH (ref 0.61–1.24)
GFR calc Af Amer: >60 mL/min (ref >60)
GFR calc non Af Amer: 58 mL/min — ABNORMAL LOW (ref >60)
Glucose, Bld: 100 mg/dL — ABNORMAL HIGH (ref 70–99)
Potassium: 3.6 mmol/L (ref 3.5–5.1)
Sodium: 138 mmol/L (ref 135–145)

## 2018-12-05 LAB — MAGNESIUM: Magnesium: 2 mg/dL (ref 1.7–2.4)

## 2018-12-05 MED ORDER — POTASSIUM CHLORIDE CRYS ER 20 MEQ PO TBCR
40.0000 meq | EXTENDED_RELEASE_TABLET | Freq: Two times a day (BID) | ORAL | Status: AC
  Administered 2018-12-05 (×2): 40 meq via ORAL
  Filled 2018-12-05 (×2): qty 2

## 2018-12-05 MED ORDER — FUROSEMIDE 10 MG/ML IJ SOLN
40.0000 mg | Freq: Two times a day (BID) | INTRAMUSCULAR | Status: DC
  Administered 2018-12-05 – 2018-12-06 (×2): 40 mg via INTRAVENOUS
  Filled 2018-12-05 (×2): qty 4

## 2018-12-05 NOTE — Progress Notes (Signed)
[facility] [place] [facility]   PATIENT NAME: Kenneth Ferguson    MR#:  [id number]  DATE OF BIRTH:  May 04, 1965  SUBJECTIVE:   Denies CP HR still elevated  REVIEW OF SYSTEMS:    Review of Systems  Constitutional: Negative for fever, chills weight loss HENT: Negative for ear pain, nosebleeds, congestion, facial swelling, rhinorrhea, neck pain, neck stiffness and ear discharge.   Respiratory: Negative for cough, shortness of breath, wheezing  Cardiovascular: Negative for chest pain, palpitations and leg swelling.  Gastrointestinal: Negative for heartburn, abdominal pain, vomiting, diarrhea or consitpation Genitourinary: Negative for dysuria, urgency, frequency, hematuria Musculoskeletal: Negative for back pain or joint pain Neurological: Negative for dizziness, seizures, syncope, focal weakness,  numbness and headaches.  Hematological: Does not bruise/bleed easily.  Psychiatric/Behavioral: Negative for hallucinations, confusion, dysphoric mood    Tolerating Diet: yes      DRUG ALLERGIES:  No Known Allergies  VITALS:  Blood pressure 114/71, pulse 93, temperature 97.8 F (36.6 C), temperature source Axillary, resp. rate (!) 27, height 5\' 8"  (1.727 m), weight 116.4 kg, SpO2 95 %.  PHYSICAL EXAMINATION:  Constitutional: Appears well-developed and well-nourished. No distress. HENT: Normocephalic. Oropharynx is clear and moist.  Eyes: Conjunctivae and EOM are normal. PERRLA, no scleral icterus.  Neck: Normal ROM. Neck supple. No JVD. No tracheal deviation. CVS: Tachycardic with irregular irregular heartbeat no murmurs no gallops, no carotid bruit.  Pulmonary: Effort and breath sounds normal, no stridor, rhonchi, wheezes, rales.  Abdominal: Soft. BS +,  no distension, tenderness, rebound or guarding.  Musculoskeletal: Normal range of motion. No edema and no tenderness.  Neuro: Alert. CN 2-12 grossly intact. No focal deficits. Skin: Skin is warm and dry. No  rash noted. Psychiatric: Normal mood and affect.      LABORATORY PANEL:   CBC Recent Labs  Lab 12/05/18 0501  WBC 7.1  HGB 15.1  HCT 45.6  PLT 211   ------------------------------------------------------------------------------------------------------------------  Chemistries  Recent Labs  Lab 11/30/18 1048  12/05/18 0501  NA 134*   < > 138  K 3.5   < > 3.6  CL 99   < > 96*  CO2 20*   < > 30  GLUCOSE 122*   < > 100*  BUN 14   < > 15  CREATININE 1.34*   < > 1.38*  CALCIUM 8.6*   < > 9.2  MG  --    < > 2.0  AST 33  --   --   ALT 24  --   --   ALKPHOS 87  --   --   BILITOT 2.4*  --   --    < > = values in this interval not displayed.   ------------------------------------------------------------------------------------------------------------------  Cardiac Enzymes No results for input(s): TROPONINI in the last 168 hours. ------------------------------------------------------------------------------------------------------------------  RADIOLOGY:  No results found.   ASSESSMENT AND PLAN:   53 year old male with heart failure reduced ejection fraction, persistent atrial fibrillation status post recent cardioversion and left atrial appendage thrombus who presented to the emergency room due to shortness of breath and found to have atrial fibrillation with RVR.   1.  Persistent atrial fibrillation with RVR status post recent TEE guided cardioversion: Patient transitioned to PO Amio Continue digoxin and bisoprolol Continue Eliquis for CVA prevention Cardioversion planned for Thursday/Friday  2.  Acute on chronic systolic heart failure EF 20-25%:  Continue Milrinone and  Lasix and Aldactone continue to monitor intake and output with daily weight CHF clinic  upon discharge If blood pressure tolerates, consider trial of ACEI/ARB.  3. Essential hypertension: Blood pressure better this am Restarted on Bisoprolol and  Aldactone Continue to monitor  4.   Depression with the loss of his wife recently: Continue Lexapro 5.  Hypokalemia from Lasix: Replete and recheck in a.m.  Management plans discussed with the patient and he is in agreement. D/w cardiology  CODE STATUS: full  TOTAL TIME TAKING CARE OF THIS PATIENT: 25 minutes.     POSSIBLE D/C Friday, DEPENDING ON CLINICAL CONDITION.   Bettey Costa M.D on 12/05/2018 at 10:55 AM  Between 7am to 6pm - Pager - [phone] After 6pm go to [email or web address] - password [email or web address] [facility]  Office  [phone]  CC: Primary care physician; Patient, [name]  Note: This dictation was prepared with Dragon dictation along with smaller phrase technology. Any transcriptional errors that result from this process are unintentional.

## 2018-12-05 NOTE — Progress Notes (Signed)
[place] for Electrolyte Monitoring and Replacement   Recent Labs: Potassium (mmol/L)  Date Value  12/05/2018 3.6   Magnesium (mg/dL)  Date Value  12/05/2018 2.0   Calcium (mg/dL)  Date Value  12/05/2018 9.2   Albumin (g/dL)  Date Value  11/30/2018 3.6   Phosphorus (mg/dL)  Date Value  12/02/2018 3.2   Sodium (mmol/L)  Date Value  12/05/2018 138    Assessment: 53 year old male with afib on Eliquis PTA. Admitted from [place] office. Patient was started on Lasix 80 mg IV BID per recommendation from Cardiology which was changed to 40 mg BID on 10/28 d/t small elevation in SCr. Pharmacy to monitor electrolytes.  Goal of Therapy:  Given Previous Cardiac History: Potassium 4.0 - 5.1 mmol/L Magnesium 2.0 - 2.4 mg/dL All other Electrolytes WNL  Plan:    This patient is still refusing IV potassium replacements: he received a total of 80 mEq oral KCl yesterday and the level is still below goal: replace with an additional 40 mEq oral KCl x 2  Spironolactone was started 10/27 which can increase potassium levels   BMP, magnesium in am  Dallie Piles, PharmD Clinical Pharmacist 12/05/2018 9:29 AM

## 2018-12-05 NOTE — Progress Notes (Addendum)
CRITICAL CARE NOTE  CC  follow up atrial fibrillation with RVR and worsening HF symptoms  SUBJECTIVE Patient condition is improving - edema in legs has significantly decreased and pt feels better overall since admission.  Pt's hypotension is resolving, but he remains tachycardic in the 120s-130s.  BP 114/71   Pulse (!) 132   Temp 97.8 F (36.6 C) (Axillary)   Resp (!) 21   Ht 5\' 8"  (1.727 m)   Wt 116.4 kg   SpO2 95%   BMI 39.02 kg/m    I/O last 3 completed shifts: In: 1168.3 [P.O.:960; I.V.:208.3] Out: 5730 [Urine:5730] No intake/output data recorded.  SpO2: 95 %   SIGNIFICANT EVENTS 10/23: Admitted to the ICU for afib with RVR. Started on amiodarone and heparin drip. Given 80 mg lasix IV BID for fluid overload. Echo showed EF of 25-30% and a small-moderate pericardial effusion. Milrinone infusion ordered.  10/24: Pt continued on amiodarone and heparin drip. Cardioversion considered. 10/25 - clinically improved, reports less SOB, no overnight events. BM this morning no diarrhea  10/26- Pt condition is improving, states he feels better. Urine output was 5500 mL yesterday. Creatinine improving. Pt has been hypotensive and tachycardic. Amiodarone discontinued. Digoxin given this AM. Per nurse, milrinone infusion was not initiated until yesterday, although ordered Friday.  10/27 - Pt's heart failure symptoms have been improving. His hypotension has also been improving, but rate is still elevated. Cardiology restarted his bisoprolol, and started him on oral amiodarone and digoxin daily to try and control his rate. Cardioversion considered for this Thursday or Friday. BUN and creatinine improving.  10/28: Pt's heart failure symptoms have been improving. Hypotension have been improving as well. Pt is still tachycardic, on bisoprolol, amiodarone and digoxin for rate control. Lasix has been decreased from 80 mg BID to 40 mg BID. Has a cardioversion scheduled for tomorrow.   REVIEW OF  SYSTEMS  Review of Systems  Constitutional: Negative for chills and fever.  Eyes: Negative for blurred vision.  Respiratory: Negative for cough and shortness of breath.   Cardiovascular: Negative for chest pain, palpitations and leg swelling.  Gastrointestinal: Negative for abdominal pain, constipation, diarrhea, nausea and vomiting.  Genitourinary: Negative for dysuria.  Neurological: Negative for dizziness, weakness and headaches.     PHYSICAL EXAMINATION:  GENERAL:Pt appears to be in no acute distress and looks comfortable HEAD: Normocephalic, atraumatic.  EYES: Pupils equal, round, reactive to light. No scleral icterus.  MOUTH: Moist mucosal membrane. NECK: Supple.  PULMONARY:CTA bilaterally CARDIOVASCULAR: S1 and S2.irregular rate and rhythm. No murmurs, rubs, or gallops.  GASTROINTESTINAL: Soft, nontender, -distended. No masses. Positive bowel sounds. No hepatosplenomegaly.  MUSCULOSKELETAL: No swelling, clubbing, or edema. Edema in legs has improved significantly  NEUROLOGIC:A+Ox4. SKIN:intact,warm,dry   MEDICATIONS: I have reviewed all medications and confirmed regimen as documented   CULTURE RESULTS   Recent Results (from the past 240 hour(s))  MRSA PCR Screening     Status: None   Collection Time: 11/30/18 10:05 AM   Specimen: Nasopharyngeal  Result Value Ref Range Status   MRSA by PCR NEGATIVE NEGATIVE Final    Comment:        The GeneXpert MRSA Assay (FDA approved for NASAL specimens only), is one component of a comprehensive MRSA colonization surveillance program. It is not intended to diagnose MRSA infection nor to guide or monitor treatment for MRSA infections. Performed at [facility], [address] [place], [place] [address]   SARS Coronavirus 2 by RT PCR (hospital order, performed in  [facility] hospital lab)     Status: None   Collection Time: 11/30/18 11:17 AM  Result Value Ref Range Status   SARS Coronavirus 2 NEGATIVE  NEGATIVE Final    Comment: (NOTE) If result is NEGATIVE SARS-CoV-2 target nucleic acids are NOT DETECTED. The SARS-CoV-2 RNA is generally detectable in upper and lower  respiratory specimens during the acute phase of infection. The lowest  concentration of SARS-CoV-2 viral copies this assay can detect is 250  copies / mL. A negative result does not preclude SARS-CoV-2 infection  and should not be used as the sole basis for treatment or other  patient management decisions.  A negative result may occur with  improper specimen collection / handling, submission of specimen other  than nasopharyngeal swab, presence of viral mutation(s) within the  areas targeted by this assay, and inadequate number of viral copies  (<250 copies / mL). A negative result must be combined with clinical  observations, patient history, and epidemiological information. If result is POSITIVE SARS-CoV-2 target nucleic acids are DETECTED. The SARS-CoV-2 RNA is generally detectable in upper and lower  respiratory specimens dur ing the acute phase of infection.  Positive  results are indicative of active infection with SARS-CoV-2.  Clinical  correlation with patient history and other diagnostic information is  necessary to determine patient infection status.  Positive results do  not rule out bacterial infection or co-infection with other viruses. If result is PRESUMPTIVE POSTIVE SARS-CoV-2 nucleic acids MAY BE PRESENT.   A presumptive positive result was obtained on the submitted specimen  and confirmed on repeat testing.  While 2019 novel coronavirus  (SARS-CoV-2) nucleic acids may be present in the submitted sample  additional confirmatory testing may be necessary for epidemiological  and / or clinical management purposes  to differentiate between  SARS-CoV-2 and other Sarbecovirus currently known to infect humans.  If clinically indicated additional testing with an alternate test  methodology [id number]) is  advised. The SARS-CoV-2 RNA is generally  detectable in upper and lower respiratory sp ecimens during the acute  phase of infection. The expected result is Negative. Fact Sheet for Patients:  BoilerBrush.com.cy Fact Sheet for Healthcare Providers: https://pope.com/ This test is not yet approved or cleared by the [place] FDA and has been authorized for detection and/or diagnosis of SARS-CoV-2 by FDA under an Emergency Use Authorization (EUA).  This EUA will remain in effect (meaning this test can be used) for the duration of the COVID-19 declaration under Section 564(b)(1) of the Act, 21 U.S.C. section 360bbb-3(b)(1), unless the authorization is terminated or revoked sooner. Performed at [facility], [address], [place], [place] [address]            ASSESSMENT AND PLAN SYNOPSIS   Atrial Fibrillation with RVR -Continue oral amiodarone, digoxin and bisoprolol as recommended by cardiology  -Continue Eliquis for anticoagulation  -Monitor on EKG    Acute HF with rEFwith EF 20-30% -begin to wean milrinone drip  -oxygen as needed -Lasix dose decreased to 40 mg BID - Continue to monitor kidney function  -follow up cardiac enzymes as indicated -follow up cardiology recs- appreciate cardiology consults -monitor urine output- 3.6 L urine output yesterday    CARDIAC -ICU monitoring  GI GI PROPHYLAXIS as indicated  NUTRITIONAL STATUS DIET--> Cardiac diet as tolerated Constipation protocol as indicated   ENDO - will use ICU hypoglycemic\Hyperglycemia protocol if needed  Psych -continue patient on lexapro  ELECTROLYTES -follow labs as needed -replace as needed - continue  potassium supplementation  -pharmacy consultation and following   DVT/GI PRX ordered TRANSFUSIONS AS NEEDED MONITOR FSBS ASSESS the need for LABS  Overall, patient's acute condition is improving.  Cardioversion scheduled for tomorrow.   Lucie Leather, M.D.  [facility]  Medical Director [facility] Medical Director [facility] Cardio-Pulmonary Department

## 2018-12-05 NOTE — Progress Notes (Signed)
Progress Note  Patient Name: Kenneth Ferguson Date of Encounter: 12/05/2018  Primary Cardiologist: Yvonne Kendall, MD   Subjective   Edema continues to improve.  No acute events overnight.  Feels fine. Inpatient Medications    Scheduled Meds: . amiodarone  200 mg Oral BID  . apixaban  5 mg Oral BID  . bisoprolol  2.5 mg Oral Daily  . Chlorhexidine Gluconate Cloth  6 each Topical Daily  . digoxin  0.125 mg Oral Daily  . furosemide  40 mg Intravenous BID  . potassium chloride  40 mEq Oral BID  . spironolactone  25 mg Oral Daily   Continuous Infusions: . milrinone 0.125 mcg/kg/min (12/05/18 0600)   PRN Meds: acetaminophen **OR** acetaminophen, ondansetron **OR** ondansetron (ZOFRAN) IV, promethazine, zolpidem   Vital Signs    Vitals:   12/05/18 0500 12/05/18 0515 12/05/18 0839 12/05/18 0936  BP:  103/68  114/71  Pulse:      Resp:  (!) 22  (!) 27  Temp:   97.8 F (36.6 C)   TempSrc:   Axillary   SpO2:      Weight: 116.4 kg     Height:        Intake/Output Summary (Last 24 hours) at 12/05/2018 1043 Last data filed at 12/05/2018 0600 Gross per 24 hour  Intake 571.63 ml  Output 3630 ml  Net -3058.37 ml   Last 3 Weights 12/05/2018 12/04/2018 12/03/2018  Weight (lbs) 256 lb 9.9 oz 247 lb 5.7 oz 264 lb 5.3 oz  Weight (kg) 116.4 kg 112.2 kg 119.9 kg      Telemetry    Atrial fibrillation heart rate 130s- Personally Reviewed  ECG    Not performed today  Physical Exam   GEN: No acute distress.   Neck: No JVD Cardiac:  Irregular irregular, tachycardic, no murmurs, rubs, or gallops.  Respiratory: Clear to auscultation bilaterally. GI: Soft, nontender, non-distended  MS:  1+ edema; No deformity. Neuro:  Nonfocal  Psych: Normal affect   Labs    High Sensitivity Troponin:   Recent Labs  Lab 11/30/18 1052 11/30/18 1254 11/30/18 2024 11/30/18 2206  TROPONINIHS 29* 31* 36* 30*      Chemistry Recent Labs  Lab 11/30/18 1048  12/03/18 0526 12/04/18  0452 12/04/18 1155 12/05/18 0501  NA 134*   < > 137 137  --  138  K 3.5   < > 2.7* 3.2* 3.7 3.6  CL 99   < > 97* 97*  --  96*  CO2 20*   < > 29 32  --  30  GLUCOSE 122*   < > 96 96  --  100*  BUN 14   < > 14 11  --  15  CREATININE 1.34*   < > 1.27* 1.29*  --  1.38*  CALCIUM 8.6*   < > 8.2* 8.7*  --  9.2  PROT 7.0  --   --   --   --   --   ALBUMIN 3.6  --   --   --   --   --   AST 33  --   --   --   --   --   ALT 24  --   --   --   --   --   ALKPHOS 87  --   --   --   --   --   BILITOT 2.4*  --   --   --   --   --  GFRNONAA 60*   < > >60 >60  --  58*  GFRAA >60   < > >60 >60  --  >60  ANIONGAP 15   < > 11 8  --  12   < > = values in this interval not displayed.     Hematology Recent Labs  Lab 12/02/18 0532 12/04/18 0452 12/05/18 0501  WBC 6.9 6.8 7.1  RBC 4.60 4.80 5.04  HGB 13.6 14.4 15.1  HCT 42.4 43.8 45.6  MCV 92.2 91.3 90.5  MCH 29.6 30.0 30.0  MCHC 32.1 32.9 33.1  RDW 16.1* 15.6* 15.3  PLT 166 209 211    BNPNo results for input(s): BNP, PROBNP in the last 168 hours.   DDimer No results for input(s): DDIMER in the last 168 hours.   Radiology    No results found.  Cardiac Studies   None obtained today  Patient Profile     53 y.o. male with history of heart failure reduced ejection fraction EF 20 to 25%, persistent atrial fibrillation who presents due to fluid overload.  Assessment & Plan    Overall patient feels much better, he is net -2.5 L over the past 24 hours.  His creatinine is 1.38 (was 1.29).  He is still tachycardic, blood pressure is on the low side.  Heart rates are not adequately controlled.  1.  Heart failure reduced ejection fraction, EF 20 to 25%. Patient is net -4 L over the past 24 hours. decrease Lasix to of 40 mg twice daily Monitor Cr Continue milrinone    2.    Persistent atrial fibrillation -cont po amio 200mg  bid. -Cont dig 0.125, bisoprolol 2.5mg  bid. -Continue Eliquis 5 mg twice daily Plan for DCCV in a day or 2.    Greater than 50% was spent in counseling and coordination of care with patient Total encounter time 35 minutes or more    Signed, Kate Sable, MD  12/05/2018, 10:43 AM

## 2018-12-06 DIAGNOSIS — I420 Dilated cardiomyopathy: Secondary | ICD-10-CM

## 2018-12-06 DIAGNOSIS — I5043 Acute on chronic combined systolic (congestive) and diastolic (congestive) heart failure: Secondary | ICD-10-CM | POA: Diagnosis not present

## 2018-12-06 DIAGNOSIS — I4891 Unspecified atrial fibrillation: Secondary | ICD-10-CM | POA: Diagnosis not present

## 2018-12-06 LAB — BASIC METABOLIC PANEL
Anion gap: 8 (ref 5–15)
BUN: 14 mg/dL (ref 6–20)
CO2: 30 mmol/L (ref 22–32)
Calcium: 9.3 mg/dL (ref 8.9–10.3)
Chloride: 99 mmol/L (ref 98–111)
Creatinine, Ser: 1.19 mg/dL (ref 0.61–1.24)
GFR calc Af Amer: >60 mL/min (ref >60)
GFR calc non Af Amer: >60 mL/min (ref >60)
Glucose, Bld: 97 mg/dL (ref 70–99)
Potassium: 4.2 mmol/L (ref 3.5–5.1)
Sodium: 137 mmol/L (ref 135–145)

## 2018-12-06 LAB — CBC
HCT: 47.1 % (ref 39.0–52.0)
Hemoglobin: 15.4 g/dL (ref 13.0–17.0)
MCH: 29.9 pg (ref 26.0–34.0)
MCHC: 32.7 g/dL (ref 30.0–36.0)
MCV: 91.5 fL (ref 80.0–100.0)
Platelets: 244 10*3/uL (ref 150–400)
RBC: 5.15 MIL/uL (ref 4.22–5.81)
RDW: 15.2 % (ref 11.5–15.5)
WBC: 8.2 10*3/uL (ref 4.0–10.5)
nRBC: 0 % (ref 0.0–0.2)

## 2018-12-06 LAB — MAGNESIUM: Magnesium: 2 mg/dL (ref 1.7–2.4)

## 2018-12-06 MED ORDER — TORSEMIDE 20 MG PO TABS
40.0000 mg | ORAL_TABLET | Freq: Two times a day (BID) | ORAL | Status: DC
  Administered 2018-12-06 – 2018-12-07 (×2): 40 mg via ORAL
  Filled 2018-12-06 (×2): qty 2

## 2018-12-06 NOTE — Progress Notes (Signed)
Cardiovascular and Pulmonary Nurse Navigator Note:    This RN was called to ICU to obtain ReDS Clip reading on patient.  Patient is on Milrinone drip at present.   Explained to patient the way the reading is obtained and the value of the reading.  Received verbal consent to obtain reading.  Patient assisted out of bed to straight chair at bedside.  Assigned RN assisted this RN with obtaining reading.  Station Marker at D / Ruler Value 37.5.   First reading 37, second reading 35.  Dr. Rockey Situ informed of readings.  See documentation in ReDS Vest / Clip flowsheet.    Roanna Epley, RN, BSN, [facility]  Cardiovascular & Pulmonary Nurse Navigator  Direct Line: [phone]  Department Phone #: [phone] Fax: [phone]  Email Address: [email or web address]

## 2018-12-06 NOTE — Progress Notes (Signed)
[place] for Electrolyte Monitoring and Replacement   Recent Labs: Potassium (mmol/L)  Date Value  12/06/2018 4.2   Magnesium (mg/dL)  Date Value  12/06/2018 2.0   Calcium (mg/dL)  Date Value  12/06/2018 9.3   Albumin (g/dL)  Date Value  11/30/2018 3.6   Phosphorus (mg/dL)  Date Value  12/02/2018 3.2   Sodium (mmol/L)  Date Value  12/06/2018 137    Assessment: 53 year old male with afib on Eliquis PTA. Admitted from [place] office. Patient was started on Lasix 80 mg IV BID per recommendation from Cardiology which was changed to 40 mg BID on 10/28 d/t small elevation in SCr. Pharmacy to monitor electrolytes.   Goal of Therapy:  Given Previous Cardiac History: Potassium 4.0 - 5.1 mmol/L Magnesium 2.0 - 2.4 mg/dL All other Electrolytes WNL  Plan:    This patient is still refusing IV potassium replacements: he received a total of 80 mEq oral KCl yesterday and the level is at goal. No replacement warranted today.  Spironolactone was started 10/27 which can increase potassium levels   BMP, magnesium in am  Sallye Lat, PharmD Candidate 12/06/2018 2:06 PM

## 2018-12-06 NOTE — Progress Notes (Signed)
[facility] [place] NAME: Kenneth Ferguson    MR#:  [id number]  DATE OF BIRTH:  [birth year]  SUBJECTIVE:  Will go for cardioversion in am Hr remain elevated but improved somewhat No CP/SOB  REVIEW OF SYSTEMS:    Review of Systems  Constitutional: Negative for fever, chills weight loss HENT: Negative for ear pain, nosebleeds, congestion, facial swelling, rhinorrhea, neck pain, neck stiffness and ear discharge.   Respiratory: Negative for cough, shortness of breath, wheezing  Cardiovascular: Negative for chest pain, palpitations and leg swelling.  Gastrointestinal: Negative for heartburn, abdominal pain, vomiting, diarrhea or consitpation Genitourinary: Negative for dysuria, urgency, frequency, hematuria Musculoskeletal: Negative for back pain or joint pain Neurological: Negative for dizziness, seizures, syncope, focal weakness,  numbness and headaches.  Hematological: Does not bruise/bleed easily.  Psychiatric/Behavioral: Negative for hallucinations, confusion, dysphoric mood    Tolerating Diet: yes      DRUG ALLERGIES:  No Known Allergies  VITALS:  Blood pressure 122/66, pulse (!) 132, temperature 97.9 F (36.6 C), temperature source Oral, resp. rate 15, height 5\' 8"  (1.727 m), weight 109.5 kg, SpO2 100 %.  PHYSICAL EXAMINATION:  Constitutional: Appears well-developed and well-nourished. No distress. HENT: Normocephalic. Marland Kitchen Oropharynx is clear and moist.  Eyes: Conjunctivae and EOM are normal. PERRLA, no scleral icterus.  Neck: Normal ROM. Neck supple. No JVD. No tracheal deviation. CVS: Tachycardic with irregular irregular heartbeat no murmurs no gallops, no carotid bruit.  Pulmonary: Effort and breath sounds normal, no stridor, rhonchi, wheezes, rales.  Abdominal: Soft. BS +,  no distension, tenderness, rebound or guarding.  Musculoskeletal: Normal range of motion. No edema and no tenderness.  Neuro: Alert. CN 2-12 grossly  intact. No focal deficits. Skin: Skin is warm and dry. No rash noted. Psychiatric: Normal mood and affect.      LABORATORY PANEL:   CBC Recent Labs  Lab 12/06/18 0404  WBC 8.2  HGB 15.4  HCT 47.1  PLT 244   ------------------------------------------------------------------------------------------------------------------  Chemistries  Recent Labs  Lab 11/30/18 1048  12/06/18 0404  NA 134*   < > 137  K 3.5   < > 4.2  CL 99   < > 99  CO2 20*   < > 30  GLUCOSE 122*   < > 97  BUN 14   < > 14  CREATININE 1.34*   < > 1.19  CALCIUM 8.6*   < > 9.3  MG  --    < > 2.0  AST 33  --   --   ALT 24  --   --   ALKPHOS 87  --   --   BILITOT 2.4*  --   --    < > = values in this interval not displayed.   ------------------------------------------------------------------------------------------------------------------  Cardiac Enzymes No results for input(s): TROPONINI in the last 168 hours. ------------------------------------------------------------------------------------------------------------------  RADIOLOGY:  No results found.   ASSESSMENT AND PLAN:   53 year old male with heart failure reduced ejection fraction, persistent atrial fibrillation status post recent cardioversion and left atrial appendage thrombus who presented to the emergency room due to shortness of breath and found to have atrial fibrillation with RVR.   1.  Persistent atrial fibrillation with RVR status post recent TEE guided cardioversion:  Continue Amiodarone,digoxin and bisoprolol Continue Eliquis for CVA prevention Cardioversion planned for tomorrow am   2.  Acute on chronic systolic heart failure EF 20-25%:  Continue  Torsemide and Aldactone Milrinone d/c today 14 liters negative continue  to monitor intake and output with daily weight CHF clinic upon discharge If blood pressure tolerates, consider trial of ACEI/ARB.  3. Essential hypertension:Continue on Bisoprolol and   Aldactone Continue to monitor  4.  Depression : Continue Lexapro 5.  Hypokalemia from Lasix: Replete PRN  Management plans discussed with the patient and he is in agreement. D/w cardiology  CODE STATUS: full  TOTAL TIME TAKING CARE OF THIS PATIENT: 25 minutes.     POSSIBLE D/C FridaySaturday, DEPENDING ON CLINICAL CONDITION.   Adrian Saran M.D on 12/06/2018 at 12:31 PM  Between 7am to 6pm - Pager - [phone] After 6pm go to [email or web address] - password [email or web address]  [facility]  Office  [phone]  CC: Primary care physician; Patient, [name]  Note: This dictation was prepared with Dragon dictation along with smaller phrase technology. Any transcriptional errors that result from this process are unintentional.

## 2018-12-06 NOTE — Progress Notes (Addendum)
CRITICAL CARE NOTE  CC  follow up for afib with RVR and worsening HF symptoms   SUBJECTIVE Patient condition is improving - edema in legs has significantly decreased and pt feels better overall since admission.  Pt's hypotension is resolving, but he remains tachycardic in the 120s-130s.   BP (!) 99/55   Pulse (!) 132   Temp 97.8 F (36.6 C) (Axillary)   Resp (!) 24   Ht 5\' 8"  (1.727 m)   Wt 109.5 kg   SpO2 95%   BMI 36.71 kg/m    I/O last 3 completed shifts: In: 159 [I.V.:159] Out: 2650 [Urine:2650] No intake/output data recorded.  SpO2: 95 %   SIGNIFICANT EVENTS 10/23: Admitted to the ICU for afib with RVR. Started on amiodarone and heparin drip. Given 80 mg lasix IV BID for fluid overload. Echo showed EF of 25-30% and a small-moderate pericardial effusion. Milrinone infusion ordered.  10/24: Pt continued on amiodarone and heparin drip. Cardioversion considered. 10/25 - clinically improved, reports less SOB, no overnight events. BM this morning no diarrhea  10/26- Pt condition is improving, states he feels better. Urine output was 5500 mL yesterday. Creatinine improving. Pt has been hypotensive and tachycardic. Amiodarone discontinued. Digoxin given this AM. Per nurse, milrinone infusion was not initiated until yesterday, although ordered Friday. 10/27 - Pt's heart failure symptoms have been improving. His hypotension has also been improving, but rate is still elevated. Cardiology restarted his bisoprolol, and started him on oral amiodarone and digoxin daily to try and control his rate. Cardioversion considered for this Thursday or Friday. BUN and creatinine improving. 10/28: Pt's heart failure symptoms have been improving. Hypotension have been improving as well. Pt is still tachycardic, on bisoprolol, amiodarone and digoxin for rate control. Lasix has been decreased from 80 mg BID to 40 mg BID. Has a cardioversion scheduled for tomorrow.  10/29: Pt condition has improved.  Hypotension resolving, and tachycardia has slightly improved since yesterday. BUN and creatinine improving. Pt scheduled for cardioversion today and then going home.    REVIEW OF SYSTEMS Constitutional: Negative for chills and fever.  Eyes: Negative for blurred vision.  Respiratory: Negative for cough and shortness of breath.   Cardiovascular: Negative for chest pain, palpitations and leg swelling.  Gastrointestinal: Negative for abdominal pain, constipation, diarrhea, nausea and vomiting.  Genitourinary: Negative for dysuria.  Neurological: Negative for dizziness, weakness and headaches   PHYSICAL EXAMINATION: GENERAL:Pt appears to be in no acute distressand looks comfortable HEAD: Normocephalic, atraumatic.  EYES: Pupils equal, round, reactive to light. No scleral icterus.  MOUTH: Moist mucosal membrane. NECK: Supple.  PULMONARY:CTA bilaterally CARDIOVASCULAR: S1 and S2.irregular rate and rhythm. No murmurs, rubs, or gallops.  GASTROINTESTINAL: Soft, nontender, -distended. No masses. Positive bowel sounds. No hepatosplenomegaly.  MUSCULOSKELETAL: No swelling, clubbing, or edema. Edema in legs has improved significantly  NEUROLOGIC:A+Ox4. SKIN:intact,warm,dry  MEDICATIONS: I have reviewed all medications and confirmed regimen as documented   CULTURE RESULTS   Recent Results (from the past 240 hour(s))  MRSA PCR Screening     Status: None   Collection Time: 11/30/18 10:05 AM   Specimen: Nasopharyngeal  Result Value Ref Range Status   MRSA by PCR NEGATIVE NEGATIVE Final    Comment:        The GeneXpert MRSA Assay (FDA approved for NASAL specimens only), is one component of a comprehensive MRSA colonization surveillance program. It is not intended to diagnose MRSA infection nor to guide or monitor treatment for MRSA infections. Performed at [facility],  [address] [place], [place] [address]   SARS Coronavirus 2 by RT PCR (hospital order,  performed in [facility] hospital lab)     Status: None   Collection Time: 11/30/18 11:17 AM  Result Value Ref Range Status   SARS Coronavirus 2 NEGATIVE NEGATIVE Final    Comment: (NOTE) If result is NEGATIVE SARS-CoV-2 target nucleic acids are NOT DETECTED. The SARS-CoV-2 RNA is generally detectable in upper and lower  respiratory specimens during the acute phase of infection. The lowest  concentration of SARS-CoV-2 viral copies this assay can detect is 250  copies / mL. A negative result does not preclude SARS-CoV-2 infection  and should not be used as the sole basis for treatment or other  patient management decisions.  A negative result may occur with  improper specimen collection / handling, submission of specimen other  than nasopharyngeal swab, presence of viral mutation(s) within the  areas targeted by this assay, and inadequate number of viral copies  (<250 copies / mL). A negative result must be combined with clinical  observations, patient history, and epidemiological information. If result is POSITIVE SARS-CoV-2 target nucleic acids are DETECTED. The SARS-CoV-2 RNA is generally detectable in upper and lower  respiratory specimens dur ing the acute phase of infection.  Positive  results are indicative of active infection with SARS-CoV-2.  Clinical  correlation with patient history and other diagnostic information is  necessary to determine patient infection status.  Positive results do  not rule out bacterial infection or co-infection with other viruses. If result is PRESUMPTIVE POSTIVE SARS-CoV-2 nucleic acids MAY BE PRESENT.   A presumptive positive result was obtained on the submitted specimen  and confirmed on repeat testing.  While 2019 novel coronavirus  (SARS-CoV-2) nucleic acids may be present in the submitted sample  additional confirmatory testing may be necessary for epidemiological  and / or clinical management purposes  to differentiate between  SARS-CoV-2  and other Sarbecovirus currently known to infect humans.  If clinically indicated additional testing with an alternate test  methodology [phone]) is advised. The SARS-CoV-2 RNA is generally  detectable in upper and lower respiratory sp ecimens during the acute  phase of infection. The expected result is Negative. Fact Sheet for Patients:  BoilerBrush.com.cy Fact Sheet for Healthcare Providers: https://pope.com/ This test is not yet approved or cleared by the [place] FDA and has been authorized for detection and/or diagnosis of SARS-CoV-2 by FDA under an Emergency Use Authorization (EUA).  This EUA will remain in effect (meaning this test can be used) for the duration of the COVID-19 declaration under Section 564(b)(1) of the Act, 21 U.S.C. section 360bbb-3(b)(1), unless the authorization is terminated or revoked sooner. Performed at [facility], [address] [place], [place] [address]    CMP Latest Ref Rng & Units 12/06/2018 12/05/2018 12/04/2018  Glucose 70 - 99 mg/dL 97 166(A) -  BUN 6 - 20 mg/dL 14 15 -  Creatinine 6.30 - 1.24 mg/dL 1.60 1.09(N) -  Sodium 135 - 145 mmol/L 137 138 -  Potassium 3.5 - 5.1 mmol/L 4.2 3.6 3.7  Chloride 98 - 111 mmol/L 99 96(L) -  CO2 22 - 32 mmol/L 30 30 -  Calcium 8.9 - 10.3 mg/dL 9.3 9.2 -  Total Protein 6.5 - 8.1 g/dL - - -  Total Bilirubin 0.3 - 1.2 mg/dL - - -  Alkaline Phos 38 - 126 U/L - - -  AST 15 - 41 U/L - - -  ALT 0 -  44 U/L - - -   CBC    Component Value Date/Time   WBC 8.2 12/06/2018 0404   RBC 5.15 12/06/2018 0404   HGB 15.4 12/06/2018 0404   HCT 47.1 12/06/2018 0404   PLT 244 12/06/2018 0404   MCV 91.5 12/06/2018 0404   MCH 29.9 12/06/2018 0404   MCHC 32.7 12/06/2018 0404   RDW 15.2 12/06/2018 0404   LYMPHSABS 1.8 12/04/2018 0452   MONOABS 0.9 12/04/2018 0452   EOSABS 0.1 12/04/2018 0452   BASOSABS 0.0 12/04/2018 0452           ASSESSMENT AND  PLAN SYNOPSIS   Atrial Fibrillation with RVR -Continue oral amiodarone, digoxin and bisoprolol as recommended by cardiology -Continue Eliquis for anticoagulation  -Monitor on EKG  -Cardioversion scheduled for tomorrow    Acute HF with rEFwith EF 20-30% -begin to wean milrinone drip -oxygen as needed -Lasix dose decreased to 40 mg BID - Continue to monitor kidney function  -follow up cardiac enzymes as indicated -follow up cardiology recs- appreciate cardiology consults -monitor urine output- 820 mL urine output yesterday   CARDIAC -ICU monitoring  GI GI PROPHYLAXIS as indicated  NUTRITIONAL STATUS DIET--> Cardiac diet as tolerated Constipation protocol as indicated   ENDO - will use ICU hypoglycemic\Hyperglycemia protocol if needed   ELECTROLYTES -follow labs as needed -replace as needed- continue potassium supplementationwith lasix  -pharmacy consultation and following   DVT/GI PRX ordered TRANSFUSIONS AS NEEDED MONITOR FSBS ASSESS the need for LABS  Overall, patient's acute condition is improving. Cardioversion scheduled for 10/30 Discharge to home afterwards.     Lucie Leather, M.D.  [facility]  Medical Director [facility] Medical Director [facility] Cardio-Pulmonary Department

## 2018-12-06 NOTE — Progress Notes (Signed)
Permit signed for Cardioversion. Discussed NPO past MN. Patient talked about his decreased wife for 45 minutes.

## 2018-12-06 NOTE — Progress Notes (Addendum)
Progress Note  Patient Name: Kenneth Ferguson Date of Encounter: 12/06/2018  Primary Cardiologist: Nelva Bush, MD   Subjective   Reports improved shortness of breath, abdominal swelling Still on milrinone 0.125 -14 L this admission Would like to go home, very anxious given work situation, as to help his son -Still on IV Lasix twice daily  Inpatient Medications    Scheduled Meds: . amiodarone  200 mg Oral BID  . apixaban  5 mg Oral BID  . bisoprolol  2.5 mg Oral Daily  . Chlorhexidine Gluconate Cloth  6 each Topical Daily  . digoxin  0.125 mg Oral Daily  . furosemide  40 mg Intravenous BID  . spironolactone  25 mg Oral Daily   Continuous Infusions: . milrinone 0.125 mcg/kg/min (12/06/18 0800)   PRN Meds: acetaminophen **OR** acetaminophen, ondansetron **OR** ondansetron (ZOFRAN) IV, promethazine, zolpidem   Vital Signs    Vitals:   12/05/18 1706 12/05/18 2210 12/06/18 0500 12/06/18 1000  BP: 104/72 (!) 99/55  (!) 91/59  Pulse:      Resp:  (!) 24  16  Temp:    97.9 F (36.6 C)  TempSrc:    Oral  SpO2:    100%  Weight:   109.5 kg   Height:        Intake/Output Summary (Last 24 hours) at 12/06/2018 1124 Last data filed at 12/06/2018 0800 Gross per 24 hour  Intake 116.09 ml  Output 820 ml  Net -703.91 ml   Last 3 Weights 12/06/2018 12/05/2018 12/04/2018  Weight (lbs) 241 lb 6.5 oz 256 lb 9.9 oz 247 lb 5.7 oz  Weight (kg) 109.5 kg 116.4 kg 112.2 kg      Telemetry    Atrial fibrillation rate 107 up to 130, sometimes 140 at rest supine- Personally Reviewed  ECG    Atrial fibrillation- Personally Reviewed  Physical Exam   GEN: No acute distress.   Neck: No JVD Cardiac:  Irregularly irregular, no murmurs, rubs, or gallops.  Respiratory: Clear to auscultation bilaterally. GI: Soft, nontender, non-distended  MS: No edema; No deformity. Neuro:  Nonfocal  Psych: Normal affect   Labs    High Sensitivity Troponin:   Recent Labs  Lab 11/30/18  1052 11/30/18 1254 11/30/18 2024 11/30/18 2206  TROPONINIHS 29* 31* 36* 30*      Chemistry Recent Labs  Lab 11/30/18 1048  12/04/18 0452 12/04/18 1155 12/05/18 0501 12/06/18 0404  NA 134*   < > 137  --  138 137  K 3.5   < > 3.2* 3.7 3.6 4.2  CL 99   < > 97*  --  96* 99  CO2 20*   < > 32  --  30 30  GLUCOSE 122*   < > 96  --  100* 97  BUN 14   < > 11  --  15 14  CREATININE 1.34*   < > 1.29*  --  1.38* 1.19  CALCIUM 8.6*   < > 8.7*  --  9.2 9.3  PROT 7.0  --   --   --   --   --   ALBUMIN 3.6  --   --   --   --   --   AST 33  --   --   --   --   --   ALT 24  --   --   --   --   --   ALKPHOS 87  --   --   --   --   --  BILITOT 2.4*  --   --   --   --   --   GFRNONAA 60*   < > >60  --  58* >60  GFRAA >60   < > >60  --  >60 >60  ANIONGAP 15   < > 8  --  12 8   < > = values in this interval not displayed.     Hematology Recent Labs  Lab 12/04/18 0452 12/05/18 0501 12/06/18 0404  WBC 6.8 7.1 8.2  RBC 4.80 5.04 5.15  HGB 14.4 15.1 15.4  HCT 43.8 45.6 47.1  MCV 91.3 90.5 91.5  MCH 30.0 30.0 29.9  MCHC 32.9 33.1 32.7  RDW 15.6* 15.3 15.2  PLT 209 211 244    BNPNo results for input(s): BNP, PROBNP in the last 168 hours.   DDimer No results for input(s): DDIMER in the last 168 hours.   Radiology    No results found.  Cardiac Studies   Echocardiogram November 30, 2018  1. Left ventricular ejection fraction, by visual estimation, is 25 to 30%. The left ventricle has moderate to severely decreased function. Normal left ventricular size. There is moderately increased left ventricular hypertrophy.Severe hypokinesis of the  anterior, anteroseptal and apical walls  2. Global right ventricle has moderately reduced systolic function.The right ventricular size is normal. No increase in right ventricular wall thickness.  3. Left atrial size was moderately dilated.  4. Mildly elevated pulmonary artery systolic pressure.  5. Arrhythmia noted, rate 133 bpm  Patient Profile      52 y.o. male with history of persistent atrial fibrillation, HFrEF of uncertain etiology, and questionable HCM, admitted with acute on chronic HFrEF in the setting of atrial fibrillation with rapid ventricular response.  Assessment & Plan    Atrial fibrillation with rapid ventricular response: Ventricular rates still suboptimally controlled  -Continue amiodarone, digoxin, bisoprolol, Eliquis 5 twice daily REDS VEST: 33, 37, 35 Plan is to schedule cardioversion in AM tomorrow 7:30 AM  Acute on chronic HFrEF: Dramatic improvement in systolic CHF symptoms down 14 L -No significant leg edema, reports abdominal swelling dramatically improved -Symptoms exacerbated by atrial fibrillation with RVR Renal function stable Discussed need to restore normal sinus rhythm prior to discharge ---will d/c milrinone -At discharge, would likely benefit from torsemide 40 twice daily   Long discussion with him concerning CHF management Discussed how we need to wean off the milrinone slowly, not rush the diuresis Need for restoring normal sinus rhythm before discharge home After long discussion, he was open to staying overnight to get all of these issues while taking care of and not rush the process  Total encounter time more than 45 minutes  Greater than 50% was spent in counseling and coordination of care with the patient  For questions or updates, please contact [facility] Please consult [email or web address] for contact info under        Signed, Julien Nordmann, MD  12/06/2018, 11:24 AM

## 2018-12-07 ENCOUNTER — Encounter
Admission: AD | Disposition: A | Discharge status: Home / Self Care | Payer: Self-pay | Source: Ambulatory Visit | Attending: Internal Medicine

## 2018-12-07 ENCOUNTER — Encounter: Payer: Self-pay | Admitting: *Deleted

## 2018-12-07 ENCOUNTER — Inpatient Hospital Stay: Payer: 59 | Admitting: Certified Registered Nurse Anesthetist

## 2018-12-07 DIAGNOSIS — I4891 Unspecified atrial fibrillation: Secondary | ICD-10-CM | POA: Diagnosis not present

## 2018-12-07 DIAGNOSIS — I420 Dilated cardiomyopathy: Secondary | ICD-10-CM | POA: Diagnosis not present

## 2018-12-07 DIAGNOSIS — I501 Left ventricular failure: Secondary | ICD-10-CM | POA: Diagnosis not present

## 2018-12-07 DIAGNOSIS — I4819 Other persistent atrial fibrillation: Secondary | ICD-10-CM

## 2018-12-07 HISTORY — PX: CARDIOVERSION: SHX1299

## 2018-12-07 LAB — BASIC METABOLIC PANEL
Anion gap: 15 (ref 5–15)
BUN: 19 mg/dL (ref 6–20)
CO2: 29 mmol/L (ref 22–32)
Calcium: 9.3 mg/dL (ref 8.9–10.3)
Chloride: 94 mmol/L — ABNORMAL LOW (ref 98–111)
Creatinine, Ser: 1.55 mg/dL — ABNORMAL HIGH (ref 0.61–1.24)
GFR calc Af Amer: 58 mL/min — ABNORMAL LOW (ref >60)
GFR calc non Af Amer: 50 mL/min — ABNORMAL LOW (ref >60)
Glucose, Bld: 83 mg/dL (ref 70–99)
Potassium: 3.8 mmol/L (ref 3.5–5.1)
Sodium: 138 mmol/L (ref 135–145)

## 2018-12-07 LAB — MAGNESIUM: Magnesium: 2.1 mg/dL (ref 1.7–2.4)

## 2018-12-07 SURGERY — CARDIOVERSION
Anesthesia: General

## 2018-12-07 MED ORDER — PROPOFOL 10 MG/ML IV BOLUS
INTRAVENOUS | Status: AC
  Filled 2018-12-07: qty 20

## 2018-12-07 MED ORDER — POTASSIUM CHLORIDE CRYS ER 20 MEQ PO TBCR: 20.0000 meq | EXTENDED_RELEASE_TABLET | Freq: Every day | ORAL | 5 refills | Status: DC

## 2018-12-07 MED ORDER — LOSARTAN POTASSIUM 25 MG PO TABS
12.5000 mg | ORAL_TABLET | Freq: Every day | ORAL | Status: DC
  Administered 2018-12-07: 09:00:00 12.5 mg via ORAL
  Filled 2018-12-07: qty 0.5

## 2018-12-07 MED ORDER — SPIRONOLACTONE 25 MG PO TABS: 25.0000 mg | ORAL_TABLET | Freq: Every day | ORAL | 0 refills | Status: DC

## 2018-12-07 MED ORDER — LOSARTAN POTASSIUM 25 MG PO TABS: 12.5000 mg | ORAL_TABLET | Freq: Every day | ORAL | 0 refills | Status: DC

## 2018-12-07 MED ORDER — SODIUM CHLORIDE 0.9 % IV SOLN
INTRAVENOUS | Status: DC | PRN
  Administered 2018-12-07: 07:00:00 via INTRAVENOUS

## 2018-12-07 MED ORDER — EPHEDRINE SULFATE 50 MG/ML IJ SOLN
INTRAMUSCULAR | Status: DC | PRN
  Administered 2018-12-07 (×2): 5 mg via INTRAVENOUS

## 2018-12-07 MED ORDER — DIGOXIN 125 MCG PO TABS: 0.1250 mg | ORAL_TABLET | Freq: Every day | ORAL | 0 refills | Status: DC

## 2018-12-07 MED ORDER — PROPOFOL 10 MG/ML IV BOLUS
INTRAVENOUS | Status: DC | PRN
  Administered 2018-12-07: 20 mg via INTRAVENOUS
  Administered 2018-12-07 (×2): 30 mg via INTRAVENOUS
  Administered 2018-12-07: 20 mg via INTRAVENOUS

## 2018-12-07 MED ORDER — AMIODARONE HCL 200 MG PO TABS: 200.0000 mg | ORAL_TABLET | Freq: Two times a day (BID) | ORAL | 0 refills | Status: DC

## 2018-12-07 MED ORDER — TORSEMIDE 20 MG PO TABS: 40.0000 mg | ORAL_TABLET | Freq: Two times a day (BID) | ORAL | 0 refills | Status: DC

## 2018-12-07 NOTE — Anesthesia Postprocedure Evaluation (Signed)
Anesthesia Post Note  Patient: Kenneth Ferguson  Procedure(s) Performed: CARDIOVERSION (N/A )  Patient location during evaluation: Specials Recovery Anesthesia Type: General Level of consciousness: awake and alert Pain management: pain level controlled Vital Signs Assessment: post-procedure vital signs reviewed and stable Respiratory status: spontaneous breathing and respiratory function stable Cardiovascular status: stable Anesthetic complications: no     Last Vitals:  Vitals:   12/07/18 0800 12/07/18 0801  BP: 104/78 104/75  Pulse: 64 63  Resp: 18 15  Temp:    SpO2: 98% 95%    Last Pain:  Vitals:   12/07/18 0653  TempSrc: Oral  PainSc: 0-No pain                 KEPHART,WILLIAM K

## 2018-12-07 NOTE — Progress Notes (Signed)
CRITICAL CARE NOTE  CC  follow up afib with RVR and worsening CHF   SUBJECTIVE Pt condition has significantly improved since admission. Pt underwent cardioversion this AM which resolved his tachycardia and afib. Will discharge home today.    BP 105/61 (BP Location: Left Arm)   Pulse 71   Temp 98.4 F (36.9 C) (Oral)   Resp 15   Ht 5\' 8"  (1.727 m)   Wt 110.1 kg   SpO2 98%   BMI 36.91 kg/m    I/O last 3 completed shifts: In: 85.3 [I.V.:85.3] Out: 1820 [Urine:1820] Total I/O In: 100 [I.V.:100] Out: 250 [Urine:250]  SpO2: 98 % O2 Flow Rate (L/min): 1 L/min   SIGNIFICANT EVENTS 10/23: Admitted to the ICU for afib with RVR. Started on amiodarone and heparin drip. Given 80 mg lasix IV BID for fluid overload. Echo showed EF of 25-30% and a small-moderate pericardial effusion. Milrinone infusion ordered.  10/24: Pt continued on amiodarone and heparin drip. Cardioversion considered. 10/25 - clinically improved, reports less SOB, no overnight events. BM this morning no diarrhea  10/26- Pt condition is improving, states he feels better. Urine output was 5500 mL yesterday. Creatinine improving. Pt has been hypotensive and tachycardic. Amiodarone discontinued. Digoxin given this AM. Per nurse, milrinone infusion was not initiated until yesterday, although ordered Friday. 10/27 - Pt's heart failure symptoms have been improving. His hypotension has also been improving, but rate is still elevated. Cardiology restarted his bisoprolol, and started him on oral amiodarone and digoxin daily to try and control his rate. Cardioversion considered for this Thursday or Friday. BUN and creatinine improving. 10/28: Pt's heart failure symptoms have been improving. Hypotension have been improving as well. Pt is still tachycardic, on bisoprolol, amiodarone and digoxin for rate control.Lasix has been decreased from 80 mg BID to 40 mg BID.Has a cardioversion scheduled for tomorrow. 10/29: Pt condition  has improved. Hypotension resolving, and tachycardia has slightly improved since yesterday. BUN and creatinine improving. Pt scheduled for cardioversion today and then going home.  10/30: Pt condition has improved. Underwent cardioversion today. Tolerated procedure well. Afib and tachycardia resolved. Pt will be discharged home today with PO amiodarone, digoxin, bisoprolol, eliquis and lasix.   REVIEW OF SYSTEMS Constitutional: Pt reports he feels tired, but better. Negative forchillsand fever.  Eyes: Negative forblurred vision.  Respiratory: Negative forcoughand shortness of breath.  Cardiovascular: Negative forchest pain,palpitationsand leg swelling.  Gastrointestinal: Negative forabdominal pain,constipation,diarrhea,nauseaand vomiting.  Genitourinary: Negative fordysuria.  Neurological: Negative fordizziness,weaknessand headaches   PHYSICAL EXAMINATION:  GENERAL:Pt appears to be in no acute distressand looks comfortable HEAD: Normocephalic, atraumatic.  EYES: Pupils equal, round, reactive to light. No scleral icterus.  MOUTH: Moist mucosal membrane. NECK: Supple.  PULMONARY:CTA bilaterally CARDIOVASCULAR: S1 and S2.regular rate and rhythm. No murmurs, rubs, or gallops.  GASTROINTESTINAL: Soft, nontender, -distended. No masses. Positive bowel sounds. No hepatosplenomegaly.  MUSCULOSKELETAL: No swelling, clubbing, or edema.Edema in legs has improved significantly NEUROLOGIC:A+Ox4. SKIN:intact,warm,dry PSYCH: Flat affect   MEDICATIONS: I have reviewed all medications and confirmed regimen as documented   CULTURE RESULTS   Recent Results (from the past 240 hour(s))  MRSA PCR Screening     Status: None   Collection Time: 11/30/18 10:05 AM   Specimen: Nasopharyngeal  Result Value Ref Range Status   MRSA by PCR NEGATIVE NEGATIVE Final    Comment:        The GeneXpert MRSA Assay (FDA approved for NASAL specimens only), is one component of  a comprehensive MRSA colonization surveillance program. It  is not intended to diagnose MRSA infection nor to guide or monitor treatment for MRSA infections. Performed at [facility], [address] [place], [place] [address]   SARS Coronavirus 2 by RT PCR (hospital order, performed in [place] hospital lab)     Status: None   Collection Time: 11/30/18 11:17 AM  Result Value Ref Range Status   SARS Coronavirus 2 NEGATIVE NEGATIVE Final    Comment: (NOTE) If result is NEGATIVE SARS-CoV-2 target nucleic acids are NOT DETECTED. The SARS-CoV-2 RNA is generally detectable in upper and lower  respiratory specimens during the acute phase of infection. The lowest  concentration of SARS-CoV-2 viral copies this assay can detect is 250  copies / mL. A negative result does not preclude SARS-CoV-2 infection  and should not be used as the sole basis for treatment or other  patient management decisions.  A negative result may occur with  improper specimen collection / handling, submission of specimen other  than nasopharyngeal swab, presence of viral mutation(s) within the  areas targeted by this assay, and inadequate number of viral copies  (<250 copies / mL). A negative result must be combined with clinical  observations, patient history, and epidemiological information. If result is POSITIVE SARS-CoV-2 target nucleic acids are DETECTED. The SARS-CoV-2 RNA is generally detectable in upper and lower  respiratory specimens dur ing the acute phase of infection.  Positive  results are indicative of active infection with SARS-CoV-2.  Clinical  correlation with patient history and other diagnostic information is  necessary to determine patient infection status.  Positive results do  not rule out bacterial infection or co-infection with other viruses. If result is PRESUMPTIVE POSTIVE SARS-CoV-2 nucleic acids MAY BE PRESENT.   A presumptive positive result was obtained on the submitted  specimen  and confirmed on repeat testing.  While 2019 novel coronavirus  (SARS-CoV-2) nucleic acids may be present in the submitted sample  additional confirmatory testing may be necessary for epidemiological  and / or clinical management purposes  to differentiate between  SARS-CoV-2 and other Sarbecovirus currently known to infect humans.  If clinically indicated additional testing with an alternate test  methodology [phone]) is advised. The SARS-CoV-2 RNA is generally  detectable in upper and lower respiratory sp ecimens during the acute  phase of infection. The expected result is Negative. Fact Sheet for Patients:  StrictlyIdeas.no Fact Sheet for Healthcare Providers: BankingDealers.co.za This test is not yet approved or cleared by the [place] FDA and has been authorized for detection and/or diagnosis of SARS-CoV-2 by FDA under an Emergency Use Authorization (EUA).  This EUA will remain in effect (meaning this test can be used) for the duration of the COVID-19 declaration under Section 564(b)(1) of the Act, 21 U.S.C. section 360bbb-3(b)(1), unless the authorization is terminated or revoked sooner. Performed at [facility], [address] [place], [place] [address]         ASSESSMENT AND PLAN SYNOPSIS  Atrial Fibrillation with RVR -Cardioversion completed today. Resolved afib and tachycardia  -Continue oral amiodarone, digoxin and bisoprolol as recommended by cardiology -Continue Eliquis for anticoagulation    Acute HF with rEFwith EF 20-30% -milrinone discontinued  -continue PO losartan, Demadex and spironolactone at home     CARDIAC -follow up with cardiology in outpatient setting  GI GI PROPHYLAXIS as indicated  NUTRITIONAL STATUS DIET--> Cardiac diet as tolerated Constipation protocol as indicated   ENDO - will use ICU hypoglycemic\Hyperglycemia protocol if  needed   ELECTROLYTES -follow labs  as needed -replace as needed- continue potassium supplementationwith lasix  -pharmacy consultation and following   DVT/GI PRX ordered TRANSFUSIONS AS NEEDED MONITOR FSBS ASSESS the need for LABS    Overall, patient condition has improved. Will discharge to home today.    Caroline More, [place] PA-S 12/07/2018

## 2018-12-07 NOTE — Progress Notes (Signed)
Pt discharged home at this time. VSS prior to discharge. No complaints and pt denies any pain. Son is with patient.

## 2018-12-07 NOTE — CV Procedure (Signed)
Cardioversion procedure note For atrial fibrillation, persistent.  Procedure Details:  Consent: Risks of procedure as well as the alternatives and risks of each were explained to the (patient/caregiver). Consent for procedure obtained.  Time Out: Verified patient identification, verified procedure, site/side was marked, verified correct patient position, special equipment/implants available, medications/allergies/relevent history reviewed, required imaging and test results available. Performed  Patient placed on cardiac monitor, pulse oximetry, supplemental oxygen as necessary.  Sedation given: propofol IV, Dr. Kephart Pacer pads placed anterior and posterior chest.   Cardioverted 1 time(s).  Cardioverted at  150 J. Synchronized biphasic Converted to NSR   Evaluation: Findings: Post procedure EKG shows: NSR Complications: None Patient did tolerate procedure well.  Time Spent Directly with the Patient:  45 minutes   Tim Gevin Perea, M.D., Ph.D. 

## 2018-12-07 NOTE — Progress Notes (Signed)
Assumed care of pt from [place]., RN.

## 2018-12-07 NOTE — Progress Notes (Signed)
Progress Note  Patient Name: Kenneth Ferguson Date of Encounter: 12/07/2018  Primary Cardiologist: Yvonne Kendall, MD   Subjective   Successful cardioversion this morning normal sinus rhythm restored -1 L past 24 hours Blood pressure continues to run low  Inpatient Medications    Scheduled Meds: . amiodarone  200 mg Oral BID  . apixaban  5 mg Oral BID  . bisoprolol  2.5 mg Oral Daily  . Chlorhexidine Gluconate Cloth  6 each Topical Daily  . digoxin  0.125 mg Oral Daily  . losartan  12.5 mg Oral Daily  . spironolactone  25 mg Oral Daily  . torsemide  40 mg Oral BID   Continuous Infusions:  PRN Meds: acetaminophen **OR** acetaminophen, ondansetron **OR** ondansetron (ZOFRAN) IV, promethazine, zolpidem   Vital Signs    Vitals:   12/06/18 2100 12/07/18 0440 12/07/18 0653 12/07/18 0700  BP: 106/69  110/72   Pulse:   (!) 105 (!) 106  Resp: (!) 24  14 15   Temp: 98.1 F (36.7 C)  (P) 97.8 F (36.6 C)   TempSrc: Oral  Oral   SpO2: 98%  97% 97%  Weight:  110.1 kg 110.1 kg   Height:   5\' 8"  (1.727 m)     Intake/Output Summary (Last 24 hours) at 12/07/2018 0757 Last data filed at 12/06/2018 2100 Gross per 24 hour  Intake 38.25 ml  Output 1000 ml  Net -961.75 ml   Last 3 Weights 12/07/2018 12/07/2018 12/06/2018  Weight (lbs) 242 lb 11.6 oz 242 lb 11.6 oz 241 lb 6.5 oz  Weight (kg) 110.1 kg 110.1 kg 109.5 kg      Telemetry    Normal sinus rhythm rate 60s- Personally Reviewed  ECG    Normal sinus rhythm personally Reviewed  Physical Exam   Constitutional:  oriented to person, place, and time. No distress.  HENT:  Head: Grossly normal Eyes:  no discharge. No scleral icterus.  Neck: No JVD, no carotid bruits  Cardiovascular: Regular rate and rhythm, no murmurs appreciated Pulmonary/Chest: Clear to auscultation bilaterally, no wheezes or rails Abdominal: Soft.  no distension.  no tenderness.  Musculoskeletal: Normal range of motion Neurological:  normal  muscle tone. Coordination normal. No atrophy Skin: Skin warm and dry Psychiatric: normal affect, pleasant   Labs    High Sensitivity Troponin:   Recent Labs  Lab 11/30/18 1052 11/30/18 1254 11/30/18 2024 11/30/18 2206  TROPONINIHS 29* 31* 36* 30*      Chemistry Recent Labs  Lab 11/30/18 1048  12/05/18 0501 12/06/18 0404 12/07/18 0404  NA 134*   < > 138 137 138  K 3.5   < > 3.6 4.2 3.8  CL 99   < > 96* 99 94*  CO2 20*   < > 30 30 29   GLUCOSE 122*   < > 100* 97 83  BUN 14   < > 15 14 19   CREATININE 1.34*   < > 1.38* 1.19 1.55*  CALCIUM 8.6*   < > 9.2 9.3 9.3  PROT 7.0  --   --   --   --   ALBUMIN 3.6  --   --   --   --   AST 33  --   --   --   --   ALT 24  --   --   --   --   ALKPHOS 87  --   --   --   --   BILITOT 2.4*  --   --   --   --  GFRNONAA 60*   < > 58* >60 50*  GFRAA >60   < > >60 >60 58*  ANIONGAP 15   < > 12 8 15    < > = values in this interval not displayed.     Hematology Recent Labs  Lab 12/04/18 0452 12/05/18 0501 12/06/18 0404  WBC 6.8 7.1 8.2  RBC 4.80 5.04 5.15  HGB 14.4 15.1 15.4  HCT 43.8 45.6 47.1  MCV 91.3 90.5 91.5  MCH 30.0 30.0 29.9  MCHC 32.9 33.1 32.7  RDW 15.6* 15.3 15.2  PLT 209 211 244    BNPNo results for input(s): BNP, PROBNP in the last 168 hours.   DDimer No results for input(s): DDIMER in the last 168 hours.   Radiology    No results found.  Cardiac Studies   Echocardiogram November 30, 2018  1. Left ventricular ejection fraction, by visual estimation, is 25 to 30%. The left ventricle has moderate to severely decreased function. Normal left ventricular size. There is moderately increased left ventricular hypertrophy.Severe hypokinesis of the  anterior, anteroseptal and apical walls  2. Global right ventricle has moderately reduced systolic function.The right ventricular size is normal. No increase in right ventricular wall thickness.  3. Left atrial size was moderately dilated.  4. Mildly elevated pulmonary  artery systolic pressure.  5. Arrhythmia noted, rate 133 bpm  Patient Profile     53 y.o. male with history of persistent atrial fibrillation, HFrEF of uncertain etiology, and questionable HCM, admitted with acute on chronic HFrEF in the setting of atrial fibrillation with rapid ventricular response.  Assessment & Plan    Atrial fibrillation with rapid ventricular response: -Continue amiodarone 200 twice daily, digoxin, bisoprolol 2.5 daily, Eliquis 5 twice daily REDS VEST: 33, 37, 35 performed yesterday Continue above medications  Acute on chronic HFrEF: Weaned off milrinone yesterday -15 L total -Severely depressed ejection fraction 25% in the setting of atrial fibrillation Stay on torsemide 40 twice daily Close follow-up in CHF clinic, cardiology clinic   Total encounter time more than 25 minutes  Greater than 50% was spent in counseling and coordination of care with the patient   For questions or updates, please contact [facility] Please consult [email or web address] for contact info under        Signed, Ida Rogue, MD  12/07/2018, 7:57 AM

## 2018-12-07 NOTE — Discharge Instructions (Signed)
Shortness of Breath, Adult °Shortness of breath is when a person has trouble breathing enough air or when a person feels like she or he is having trouble breathing in enough air. Shortness of breath could be a sign of a medical problem. °Follow these instructions at home: ° °· Pay attention to any changes in your symptoms. °· Do not use any products that contain nicotine or tobacco, such as cigarettes, e-cigarettes, and chewing tobacco. °· Do not smoke. Smoking is a common cause of shortness of breath. If you need help quitting, ask your health care provider. °· Avoid things that can irritate your airways, such as: °? Mold. °? Dust. °? Air pollution. °? Chemical fumes. °? Things that can cause allergy symptoms (allergens), if you have allergies. °· Keep your living space clean and free of mold and dust. °· Rest as needed. Slowly return to your usual activities. °· Take over-the-counter and prescription medicines only as told by your health care provider. This includes oxygen therapy and inhaled medicines. °· Keep all follow-up visits as told by your health care provider. This is important. °Contact a health care provider if: °· Your condition does not improve as soon as expected. °· You have a hard time doing your normal activities, even after you rest. °· You have new symptoms. °Get help right away if: °· Your shortness of breath gets worse. °· You have shortness of breath when you are resting. °· You feel light-headed or you faint. °· You have a cough that is not controlled with medicines. °· You cough up blood. °· You have pain with breathing. °· You have pain in your chest, arms, shoulders, or abdomen. °· You have a fever. °· You cannot walk up stairs or exercise the way that you normally do. °These symptoms may represent a serious problem that is an emergency. Do not wait to see if the symptoms will go away. Get medical help right away. Call your local emergency services (911 in the [place] Do not drive yourself  to the hospital. °Summary °· Shortness of breath is when a person has trouble breathing enough air. It can be a sign of a medical problem. °· Avoid things that irritate your lungs, such as smoking, pollution, mold, and dust. °· Pay attention to changes in your symptoms and contact your health care provider if you have a hard time completing daily activities because of shortness of breath. °This information is not intended to replace advice given to you by your health care provider. Make sure you discuss any questions you have with your health care provider. °Document Released: 10/19/2000 Document Revised: 06/26/2017 Document Reviewed: 06/26/2017 °Elsevier Patient Education © 2020 [employer] ° °

## 2018-12-07 NOTE — Discharge Summary (Signed)
[facility] - [place] [facility]   PATIENT NAME: Kenneth Ferguson    MR#:  [id number]  DATE OF BIRTH:  [birth year]  DATE OF ADMISSION:  11/30/2018 ADMITTING PHYSICIAN: Houston Siren, MD  DATE OF DISCHARGE: 12/07/2018  PRIMARY CARE PHYSICIAN: Patient, [name]    ADMISSION DIAGNOSIS:  Acute on Chronic Heart Failure Afib with RVR  DISCHARGE DIAGNOSIS:  Active Problems:   Atrial fibrillation with RVR (HCC)   SECONDARY DIAGNOSIS:   Past Medical History:  Diagnosis Date  . HFrEF (heart failure with reduced ejection fraction) (HCC)   . Persistent atrial fibrillation (HCC)   . 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   1.  Persistent atrial fibrillation with RVR: Patient had cardioversion today on October 30.  He is converted into normal sinus rhythm.  He will have outpatient follow-up with cardiology.  As per cardiology recommendations he will continue  Amiodarone,digoxin and bisoprolol Continue Eliquis for CVA prevention    2.  Acute on chronic systolic heart failure EF 20-25%:  Continue Torsemide and Aldactone He was on milrinone while in the hospital.  This has been discontinued.  He is euvolemic at the time of discharge.  He will continue to monitor his daily weight.     3. Essential hypertension:Continue on Bisoprolol and  Aldactone   4.  Depression : Continue Lexapro 5.  Hypokalemia from Lasix: This was repleted as needed  DISCHARGE CONDITIONS AND DIET:   Stable for discharge heart healthy diet  CONSULTS OBTAINED:  Treatment Team:  Yvonne Kendall, MD [facility], [facility], MD  DRUG ALLERGIES:  No Known  Allergies  DISCHARGE MEDICATIONS:   Allergies as of 12/07/2018   No Known Allergies     Medication List    STOP taking these medications   furosemide 40 MG tablet Commonly known as: Lasix     TAKE these medications   amiodarone 200 MG tablet Commonly known as: PACERONE Take 1 tablet (200 mg total) by mouth 2 (two) times daily.   apixaban 5 MG Tabs tablet Commonly known as: ELIQUIS Take 1 tablet (5 mg total) by mouth 2 (two) times daily.   bisoprolol 5 MG tablet Commonly known as: ZEBETA Take 0.5 tablets (2.5 mg total) by mouth daily.   digoxin 0.125 MG tablet Commonly known as: LANOXIN Take 1 tablet (0.125 mg total) by mouth daily. Start taking on: December 08, 2018   losartan 25 MG tablet Commonly known as: COZAAR Take 0.5 tablets (12.5 mg total) by mouth daily. Start taking on: December 08, 2018   potassium chloride SA 20 MEQ tablet Commonly known as: KLOR-CON Take 1 tablet (20 mEq total) by mouth daily. What changed: additional instructions   spironolactone 25 MG tablet Commonly known as: ALDACTONE Take 1 tablet (25 mg total) by mouth daily. Start taking on: December 08, 2018   torsemide 20 MG tablet Commonly known as: DEMADEX Take 2 tablets (40 mg total) by mouth 2 (two) times daily.         Today   CHIEF COMPLAINT:  No acute issues overnight.  Patient status post cardioversion in normal sinus rhythm currently   VITAL SIGNS:  Blood pressure 105/61, pulse 71, temperature 98.4 F (36.9  C), temperature source Oral, resp. rate 15, height 5\' 8"  (1.727 m), weight 110.1 kg, SpO2 98 %.   REVIEW OF SYSTEMS:  Review of Systems  Constitutional: Negative.  Negative for chills, fever and malaise/fatigue.  HENT: Negative.  Negative for ear discharge, ear pain, hearing loss, nosebleeds and sore throat.   Eyes: Negative.  Negative for blurred vision and pain.  Respiratory: Negative.  Negative for cough, hemoptysis, shortness of breath and wheezing.    Cardiovascular: Negative.  Negative for chest pain, palpitations and leg swelling.  Gastrointestinal: Negative.  Negative for abdominal pain, blood in stool, diarrhea, nausea and vomiting.  Genitourinary: Negative.  Negative for dysuria.  Musculoskeletal: Negative.  Negative for back pain.  Skin: Negative.   Neurological: Negative for dizziness, tremors, speech change, focal weakness, seizures and headaches.  Endo/Heme/Allergies: Negative.  Does not bruise/bleed easily.  Psychiatric/Behavioral: Negative.  Negative for depression, hallucinations and suicidal ideas.     PHYSICAL EXAMINATION:  GENERAL:  53 y.o.-year-old patient lying in the bed with no acute distress.  NECK:  Supple, no jugular venous distention. No thyroid enlargement, no tenderness.  LUNGS: Normal breath sounds bilaterally, no wheezing, rales,rhonchi  No use of accessory muscles of respiration.  CARDIOVASCULAR: S1, S2 normal. No murmurs, rubs, or gallops.  ABDOMEN: Soft, non-tender, non-distended. Bowel sounds present. No organomegaly or mass.  EXTREMITIES: No pedal edema, cyanosis, or clubbing.  PSYCHIATRIC: The patient is alert and oriented x 3.  SKIN: No obvious rash, lesion, or ulcer.   DATA REVIEW:   CBC Recent Labs  Lab 12/06/18 0404  WBC 8.2  HGB 15.4  HCT 47.1  PLT 244    Chemistries  Recent Labs  Lab 11/30/18 1048  12/07/18 0404  NA 134*   < > 138  K 3.5   < > 3.8  CL 99   < > 94*  CO2 20*   < > 29  GLUCOSE 122*   < > 83  BUN 14   < > 19  CREATININE 1.34*   < > 1.55*  CALCIUM 8.6*   < > 9.3  MG  --    < > 2.1  AST 33  --   --   ALT 24  --   --   ALKPHOS 87  --   --   BILITOT 2.4*  --   --    < > = values in this interval not displayed.    Cardiac Enzymes No results for input(s): TROPONINI in the last 168 hours.  Microbiology Results  @[id number] @  RADIOLOGY:  No results found.    Allergies as of 12/07/2018   No Known Allergies     Medication List    STOP taking these  medications   furosemide 40 MG tablet Commonly known as: Lasix     TAKE these medications   amiodarone 200 MG tablet Commonly known as: PACERONE Take 1 tablet (200 mg total) by mouth 2 (two) times daily.   apixaban 5 MG Tabs tablet Commonly known as: ELIQUIS Take 1 tablet (5 mg total) by mouth 2 (two) times daily.   bisoprolol 5 MG tablet Commonly known as: ZEBETA Take 0.5 tablets (2.5 mg total) by mouth daily.   digoxin 0.125 MG tablet Commonly known as: LANOXIN Take 1 tablet (0.125 mg total) by mouth daily. Start taking on: December 08, 2018   losartan 25 MG tablet Commonly known as: COZAAR Take 0.5 tablets (12.5 mg total) by mouth daily. Start taking on: December 08, 2018  potassium chloride SA 20 MEQ tablet Commonly known as: KLOR-CON Take 1 tablet (20 mEq total) by mouth daily. What changed: additional instructions   spironolactone 25 MG tablet Commonly known as: ALDACTONE Take 1 tablet (25 mg total) by mouth daily. Start taking on: December 08, 2018   torsemide 20 MG tablet Commonly known as: DEMADEX Take 2 tablets (40 mg total) by mouth 2 (two) times daily.         D/w dr Rockey Situ  Management plans discussed with the patient and he is in agreement. Stable for discharge home  Patient should follow up with cardiology  CODE STATUS:     Code Status Orders  (From admission, onward)         Start     Ordered   11/30/18 1045  Full code  Continuous     11/30/18 1048        Code Status History    Date Active Date Inactive Code Status Order ID Comments User Context   08/06/2018 1750 08/10/2018 1701 Full Code [id number]  Saundra Shelling, MD Inpatient   Advance Care Planning Activity      TOTAL TIME TAKING CARE OF THIS PATIENT: 39 minutes.    Note: This dictation was prepared with Dragon dictation along with smaller phrase technology. Any transcriptional errors that result from this process are unintentional.  Bettey Costa M.D on 12/07/2018 at 10:27  AM  Between 7am to 6pm - Pager - [phone] After 6pm go to [email or web address] - password [email or web address] [facility]  Office  [phone]  CC: Primary care physician; Patient, [name]

## 2018-12-07 NOTE — Transfer of Care (Signed)
Immediate Anesthesia Transfer of Care Note  Patient: Kenneth Ferguson  Procedure(s) Performed: CARDIOVERSION (N/A )  Patient Location: PACU  Anesthesia Type:General  Level of Consciousness: awake, alert  and oriented  Airway & Oxygen Therapy: Patient Spontanous Breathing and Patient connected to nasal cannula oxygen  Post-op Assessment: Report given to RN and Post -op Vital signs reviewed and stable  Post vital signs: Reviewed and stable  Last Vitals:  Vitals Value Taken Time  BP 104/78 12/07/18 0800  Temp    Pulse 63 12/07/18 0801  Resp 15 12/07/18 0801  SpO2 95 % 12/07/18 0801    Last Pain:  Vitals:   12/07/18 0653  TempSrc: Oral  PainSc: 0-No pain      Patients Stated Pain Goal: 0 (70/01/74 9449)  Complications: No apparent anesthesia complications

## 2018-12-07 NOTE — Anesthesia Post-op Follow-up Note (Signed)
Anesthesia QCDR form completed.        

## 2018-12-07 NOTE — Progress Notes (Signed)
Pt returned to unit from cardioversion at this time. Pt is alert and oriented but says he is tired and just wants to rest. HR is in the 70s, SR after procedure. Pt denies pain. Breathing is normal on room air. VSS.

## 2018-12-07 NOTE — Anesthesia Preprocedure Evaluation (Signed)
Anesthesia Evaluation  Patient identified by MRN, date of birth, ID band Patient awake    Reviewed: Allergy & Precautions, NPO status , Patient's Chart, lab work & pertinent test results  History of Anesthesia Complications Negative for: history of anesthetic complications  Airway Mallampati: III       Dental   Pulmonary neg sleep apnea, neg COPD, Not current smoker,           Cardiovascular (-) hypertension(-) Past MI and (-) CHF + dysrhythmias Atrial Fibrillation (-) Valvular Problems/Murmurs     Neuro/Psych neg Seizures    GI/Hepatic Neg liver ROS, neg GERD  ,  Endo/Other  neg diabetes  Renal/GU negative Renal ROS     Musculoskeletal   Abdominal   Peds  Hematology   Anesthesia Other Findings   Reproductive/Obstetrics                             Anesthesia Physical Anesthesia Plan  ASA: III  Anesthesia Plan: General   Post-op Pain Management:    Induction: Intravenous  PONV Risk Score and Plan: 2 and Propofol infusion and TIVA  Airway Management Planned: Nasal Cannula  Additional Equipment:   Intra-op Plan:   Post-operative Plan:   Informed Consent: I have reviewed the patients History and Physical, chart, labs and discussed the procedure including the risks, benefits and alternatives for the proposed anesthesia with the patient or authorized representative who has indicated his/her understanding and acceptance.       Plan Discussed with:   Anesthesia Plan Comments:         Anesthesia Quick Evaluation  

## 2018-12-09 ENCOUNTER — Encounter: Payer: Self-pay | Admitting: Cardiovascular Disease

## 2018-12-12 ENCOUNTER — Telehealth: Payer: Self-pay | Admitting: Internal Medicine

## 2018-12-12 ENCOUNTER — Ambulatory Visit: Payer: 59 | Admitting: Internal Medicine

## 2018-12-12 NOTE — Telephone Encounter (Signed)
Virtual Visit Pre-Appointment Phone Call  "(Name), I am calling you today to discuss your upcoming appointment. We are currently trying to limit exposure to the virus that causes COVID-19 by seeing patients at home rather than in the office."  1. "What is the BEST phone number to call the day of the visit?" - include this in appointment notes  2. Do you have or have access to (through a family member/friend) a smartphone with video capability that we can use for your visit?" a. If yes - list this number in appt notes as cell (if different from BEST phone #) and list the appointment type as a VIDEO visit in appointment notes b. If no - list the appointment type as a PHONE visit in appointment notes  3. Confirm consent - "In the setting of the current Covid19 crisis, you are scheduled for a (phone or video) visit with your provider on (date) at (time).  Just as we do with many in-office visits, in order for you to participate in this visit, we must obtain consent.  If you'd like, I can send this to your mychart (if signed up) or email for you to review.  Otherwise, I can obtain your verbal consent now.  All virtual visits are billed to your insurance company just like a normal visit would be.  By agreeing to a virtual visit, we'd like you to understand that the technology does not allow for your provider to perform an examination, and thus may limit your provider's ability to fully assess your condition. If your provider identifies any concerns that need to be evaluated in person, we will make arrangements to do so.  Finally, though the technology is pretty good, we cannot assure that it will always work on either your or our end, and in the setting of a video visit, we may have to convert it to a phone-only visit.  In either situation, we cannot ensure that we have a secure connection.  Are you willing to proceed?" STAFF: Did the patient verbally acknowledge consent to telehealth visit? Document  YES/NO here: YES  4. Advise patient to be prepared - "Two hours prior to your appointment, go ahead and check your blood pressure, pulse, oxygen saturation, and your weight (if you have the equipment to check those) and write them all down. When your visit starts, your provider will ask you for this information. If you have an Apple Watch or Kardia device, please plan to have heart rate information ready on the day of your appointment. Please have a pen and paper handy nearby the day of the visit as well."  5. Give patient instructions for MyChart download to smartphone OR Doximity/Doxy.me as below if video visit (depending on what platform provider is using)  6. Inform patient they will receive a phone call 15 minutes prior to their appointment time (may be from unknown caller ID) so they should be prepared to answer    TELEPHONE CALL NOTE  Kenneth Ferguson has been deemed a candidate for a follow-up tele-health visit to limit community exposure during the Covid-19 pandemic. I spoke with the patient via phone to ensure availability of phone/video source, confirm preferred email & phone number, and discuss instructions and expectations.  I reminded Kenneth Ferguson to be prepared with any vital sign and/or heart rhythm information that could potentially be obtained via home monitoring, at the time of his visit. I reminded Kenneth Ferguson to expect a phone call prior to his visit.  Clarisse Gouge 12/12/2018 10:46 AM   INSTRUCTIONS FOR DOWNLOADING THE MYCHART APP TO SMARTPHONE  - The patient must first make sure to have activated MyChart and know their login information - If Apple, go to [employer] and type in MyChart in the search bar and download the app. If Android, ask patient to go to [employer] and type in [facility] in the search bar and download the app. The app is free but as with any other app downloads, their phone may require them to verify saved payment information or Apple/Android  password.  - The patient will need to then log into the app with their MyChart username and password, and select [facility] as their healthcare provider to link the account. When it is time for your visit, go to the MyChart app, find appointments, and click Begin Video Visit. Be sure to Select Allow for your device to access the Microphone and Camera for your visit. You will then be connected, and your provider will be with you shortly.  **If they have any issues connecting, or need assistance please contact MyChart service desk [phone])**  **If using a computer, in order to ensure the best quality for their visit they will need to use either of the following Internet Browsers: [employer], or Google Chrome**  IF USING DOXIMITY or DOXY.ME - The patient will receive a link just prior to their visit by text.     FULL LENGTH CONSENT FOR TELE-HEALTH VISIT   I hereby voluntarily request, consent and authorize [facility] and its employed or contracted physicians, physician assistants, nurse practitioners or other licensed health care professionals (the Practitioner), to provide me with telemedicine health care services (the Services") as deemed necessary by the treating Practitioner. I acknowledge and consent to receive the Services by the Practitioner via telemedicine. I understand that the telemedicine visit will involve communicating with the Practitioner through live audiovisual communication technology and the disclosure of certain medical information by electronic transmission. I acknowledge that I have been given the opportunity to request an in-person assessment or other available alternative prior to the telemedicine visit and am voluntarily participating in the telemedicine visit.  I understand that I have the right to withhold or withdraw my consent to the use of telemedicine in the course of my care at any time, without affecting my right to future care or treatment,  and that the Practitioner or I may terminate the telemedicine visit at any time. I understand that I have the right to inspect all information obtained and/or recorded in the course of the telemedicine visit and may receive copies of available information for a reasonable fee.  I understand that some of the potential risks of receiving the Services via telemedicine include:   Delay or interruption in medical evaluation due to technological equipment failure or disruption;  Information transmitted may not be sufficient (e.g. poor resolution of images) to allow for appropriate medical decision making by the Practitioner; and/or   In rare instances, security protocols could fail, causing a breach of personal health information.  Furthermore, I acknowledge that it is my responsibility to provide information about my medical history, conditions and care that is complete and accurate to the best of my ability. I acknowledge that Practitioner's advice, recommendations, and/or decision may be based on factors not within their control, such as incomplete or inaccurate data provided by me or distortions of diagnostic images or specimens that may result from electronic transmissions. I understand that the  practice of medicine is not an Chief Strategy Officer and that Practitioner makes no warranties or guarantees regarding treatment outcomes. I acknowledge that I will receive a copy of this consent concurrently upon execution via email to the email address I last provided but may also request a printed copy by calling the office of [facility].    I understand that my insurance will be billed for this visit.   I have read or had this consent read to me.  I understand the contents of this consent, which adequately explains the benefits and risks of the Services being provided via telemedicine.   I have been provided ample opportunity to ask questions regarding this consent and the Services and have had my questions  answered to my satisfaction.  I give my informed consent for the services to be provided through the use of telemedicine in my medical care  By participating in this telemedicine visit I agree to the above.

## 2018-12-12 NOTE — Progress Notes (Deleted)
Follow-up Outpatient Visit Date: 12/12/2018  Primary Care Provider: Patient, [name] No address on file  Chief Complaint: ***  HPI:  Kenneth Ferguson is a 53 y.o. year-old male with history of chronic systolic heart failure, persistent atrial fibrillation, and questionable hypertrophic cardiomyopathy, who presents for follow-up of acute on chronic systolic heart failure and atrial fibrillation.  I saw Kenneth Ferguson 2 weeks ago for urgent evaluation of worsening edema and shortness of breath.  He was found to be massively volume overloaded and in atrial fibrillation with rapid ventricular response.  He was directly admitted to [facility] and required aggressive diuresis with inotropic support.  He was loaded with amiodarone and underwent repeat cardioversion prior to discharge.  Hospital discharge was complicated by acute kidney injury in the setting of low output heart failure.  --------------------------------------------------------------------------------------------------  Past Medical History:  Diagnosis Date  . HFrEF (heart failure with reduced ejection fraction) ([place])   . Persistent atrial fibrillation ([place])   . 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 CARDIOVERSION N/A 08/08/2018   Procedure: CARDIOVERSION;  Surgeon: Minna Merritts, MD;  Location: [facility] ORS;  Service: Cardiovascular;  Laterality: N/A;  . CARDIOVERSION N/A 11/20/2018   Procedure: CARDIOVERSION;  Surgeon: Nelva Bush, MD;  Location: [facility] ORS;  Service: Cardiovascular;  Laterality: N/A;  . CARDIOVERSION N/A 11/20/2018   Procedure: CARDIOVERSION;  Surgeon: Nelva Bush, MD;  Location: [facility] ORS;  Service: Cardiovascular;  Laterality: N/A;  . CARDIOVERSION N/A 12/07/2018   Procedure: CARDIOVERSION;  Surgeon: Minna Merritts, MD;  Location: [facility] ORS;  Service: Cardiovascular;  Laterality: N/A;  . TEE WITHOUT CARDIOVERSION N/A  08/08/2018   Procedure: TRANSESOPHAGEAL ECHOCARDIOGRAM (TEE);  Surgeon: Minna Merritts, MD;  Location: [facility] ORS;  Service: Cardiovascular;  Laterality: N/A;  . TEE WITHOUT CARDIOVERSION N/A 11/20/2018   Procedure: TRANSESOPHAGEAL ECHOCARDIOGRAM (TEE);  Surgeon: Nelva Bush, MD;  Location: [facility] ORS;  Service: Cardiovascular;  Laterality: N/A;    No outpatient medications have been marked as taking for the 12/12/18 encounter (Appointment) with Finneas Mathe, Harrell Gave, MD.    Allergies: Patient has no known allergies.  Social History   Tobacco Use  . Smoking status: Never Smoker  . Smokeless tobacco: Never Used  Substance Use Topics  . Alcohol use: Never    Frequency: Never  . Drug use: Never    Family History  Problem Relation Age of Onset  . Emphysema Mother   . Cancer Father     Review of Systems: A 12-system review of systems was performed and was negative except as noted in the HPI.  --------------------------------------------------------------------------------------------------  Physical Exam: There were no vitals taken for this visit.  General:  *** HEENT: No conjunctival pallor or scleral icterus. Moist mucous membranes.  OP clear. Neck: Supple without lymphadenopathy, thyromegaly, JVD, or HJR. No carotid bruit. Lungs: Normal work of breathing. Clear to auscultation bilaterally without wheezes or crackles. Heart: Regular rate and rhythm without murmurs, rubs, or gallops. Non-displaced PMI. Abd: Bowel sounds present. Soft, NT/ND without hepatosplenomegaly Ext: No lower extremity edema. Radial, PT, and DP pulses are 2+ bilaterally. Skin: Warm and dry without rash.  EKG:  ***  Lab Results  Component Value Date   WBC 8.2 12/06/2018   HGB 15.4 12/06/2018   HCT 47.1 12/06/2018   MCV 91.5 12/06/2018   PLT 244 12/06/2018    Lab Results  Component Value Date   NA 138 12/07/2018   K  3.8 12/07/2018   CL 94 (L) 12/07/2018   CO2 29 12/07/2018   BUN 19 12/07/2018    CREATININE 1.55 (H) 12/07/2018   GLUCOSE 83 12/07/2018   ALT 24 11/30/2018    No results found for: CHOL, HDL, LDLCALC, LDLDIRECT, TRIG, CHOLHDL  --------------------------------------------------------------------------------------------------  ASSESSMENT AND PLAN: ***  Yvonne Kendall, MD 12/12/2018 7:51 AM

## 2018-12-18 ENCOUNTER — Telehealth: Payer: Self-pay | Admitting: *Deleted

## 2018-12-18 NOTE — Telephone Encounter (Signed)
Routing to scheduling.  Do you mind calling him this morning and switching his appointment to in person on Wednesday with Dr End or an APP?  I appreciate your help!

## 2018-12-18 NOTE — Telephone Encounter (Signed)
-----   Message from Nelva Bush, MD sent at 12/17/2018  4:53 PM EST ----- Regarding: Hospital f/u [place],  Would it be possible to reach out to Mr. Poole to change his virtual visit with me on Thursday to an in person appointment (could be Wednesday with me or any day with an APP when convenient for him)?  Given his recent hospitalization with difficult to control atrial fibrillation and severe heart failure, I do not believe that a virtual visit is appropriate unless it is not possible for the patient to come to the office.  Thanks.  Gerald Stabs

## 2018-12-19 ENCOUNTER — Other Ambulatory Visit
Admission: RE | Admit: 2018-12-19 | Discharge: 2018-12-19 | Disposition: A | Discharge status: Home / Self Care | Payer: 59 | Source: Ambulatory Visit | Attending: Internal Medicine | Admitting: Internal Medicine

## 2018-12-19 ENCOUNTER — Ambulatory Visit (INDEPENDENT_AMBULATORY_CARE_PROVIDER_SITE_OTHER): Payer: 59 | Admitting: Internal Medicine

## 2018-12-19 ENCOUNTER — Encounter: Payer: Self-pay | Admitting: Internal Medicine

## 2018-12-19 ENCOUNTER — Other Ambulatory Visit: Payer: Self-pay

## 2018-12-19 VITALS — BP 120/84 | HR 63 | Ht 68.0 in | Wt 248.0 lb

## 2018-12-19 DIAGNOSIS — I4819 Other persistent atrial fibrillation: Secondary | ICD-10-CM

## 2018-12-19 DIAGNOSIS — I5022 Chronic systolic (congestive) heart failure: Secondary | ICD-10-CM | POA: Diagnosis not present

## 2018-12-19 LAB — BASIC METABOLIC PANEL
Anion gap: 9 (ref 5–15)
BUN: 16 mg/dL (ref 6–20)
CO2: 26 mmol/L (ref 22–32)
Calcium: 9.7 mg/dL (ref 8.9–10.3)
Chloride: 101 mmol/L (ref 98–111)
Creatinine, Ser: 1.33 mg/dL — ABNORMAL HIGH (ref 0.61–1.24)
GFR calc Af Amer: >60 mL/min (ref >60)
GFR calc non Af Amer: >60 mL/min (ref >60)
Glucose, Bld: 111 mg/dL — ABNORMAL HIGH (ref 70–99)
Potassium: 3.7 mmol/L (ref 3.5–5.1)
Sodium: 136 mmol/L (ref 135–145)

## 2018-12-19 LAB — DIGOXIN LEVEL: Digoxin Level: 1.1 ng/mL (ref 0.8–2.0)

## 2018-12-19 MED ORDER — AMIODARONE HCL 200 MG PO TABS: 200.0000 mg | ORAL_TABLET | Freq: Every day | ORAL | Status: DC

## 2018-12-19 NOTE — Patient Instructions (Signed)
Medication Instructions:  Your physician has recommended you make the following change in your medication:   DECREASE Amiodarone to 200 mg daily.  *If you need a refill on your cardiac medications before your next appointment, please call your pharmacy*  Lab Work: Bmet and Digoxin lab today.  Please have your labs drawn at the medical mall upstairs  If you have labs (blood work) drawn today and your tests are completely normal, you will receive your results only by: Marland Kitchen MyChart Message (if you have MyChart) OR . A paper copy in the mail If you have any lab test that is abnormal or we need to change your treatment, we will call you to review the results.  Testing/Procedures: None ordered  Follow-Up: At [facility], you and your health needs are our priority.  As part of our continuing mission to provide you with exceptional heart care, we have created designated Provider Care Teams.  These Care Teams include your primary Cardiologist (physician) and Advanced Practice Providers (APPs -  Physician Assistants and Nurse Practitioners) who all work together to provide you with the care you need, when you need it.  Your next appointment:   4 weeks  The format for your next appointment:   In Person  Provider:    You may see Nelva Bush, MD or one of the following Advanced Practice Providers on your designated Care Team:    Murray Hodgkins, NP  Christell Faith, PA-C  Marrianne Mood, PA-C   Other Instructions N/A

## 2018-12-19 NOTE — Progress Notes (Signed)
Follow-up Outpatient Visit Date: 12/19/2018  Primary Care Provider: Patient, [name] No address on file  Chief Complaint: Follow-up heart failure and atrial fibrillation.  HPI:  Kenneth Ferguson is a 53 y.o. year-old male with history of chronic systolic heart failure, persistent atrial fibrillation, and questionable hypertrophic cardiomyopathy, who presents for follow-up of acute on chronic systolic heart failure and atrial fibrillation.  I saw Kenneth Ferguson 3 weeks ago for urgent evaluation of worsening edema and shortness of breath.  He was found to be massively volume overloaded and in atrial fibrillation with rapid ventricular response.  He was directly admitted to [facility] and required aggressive diuresis with inotropic support.  He was loaded with amiodarone and underwent repeat cardioversion prior to discharge.  Hospital discharge was complicated by acute kidney injury in the setting of low output heart failure.  Today, Kenneth Ferguson reports feeling the best that he has felt in the last 2 to 3 years.  He has minimal ankle swelling that he attributes to prior injuries.  He has not had any palpitations, lightheadedness, chest pain, or shortness of breath.  He is back to work without any difficulty.  He is tolerating his current medication regimen well.  He has not had any bleeding, remaining compliant with apixaban.  --------------------------------------------------------------------------------------------------  Past Medical History:  Diagnosis Date  . HFrEF (heart failure with reduced ejection fraction) ([place])   . Persistent atrial fibrillation ([place])   . 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 CARDIOVERSION N/A 08/08/2018   Procedure: CARDIOVERSION;  Surgeon: Minna Merritts, MD;  Location: [facility] ORS;  Service: Cardiovascular;  Laterality: N/A;  . CARDIOVERSION N/A 11/20/2018   Procedure:  CARDIOVERSION;  Surgeon: Nelva Bush, MD;  Location: [facility] OR[facility];  Service: Cardiovascular;  Laterality: N/A;  . CARDIOVERSION N/A 11/20/2018   Procedure: CARDIOVERSION;  Surgeon: Nelva Bush, MD;  Location: [facility] OR[facility];  Service: Cardiovascular;  Laterality: N/A;  . CARDIOVERSION N/A 12/07/2018   Procedure: CARDIOVERSION;  Surgeon: Minna Merritts, MD;  Location: [facility] OR[facility];  Service: Cardiovascular;  Laterality: N/A;  . TEE WITHOUT CARDIOVERSION N/A 08/08/2018   Procedure: TRANSESOPHAGEAL ECHOCARDIOGRAM (TEE);  Surgeon: Minna Merritts, MD;  Location: [facility] OR[facility];  Service: Cardiovascular;  Laterality: N/A;  . TEE WITHOUT CARDIOVERSION N/A 11/20/2018   Procedure: TRANSESOPHAGEAL ECHOCARDIOGRAM (TEE);  Surgeon: Nelva Bush, MD;  Location: [facility] ORS;  Service: Cardiovascular;  Laterality: N/A;    Current Meds  Medication Sig  . amiodarone (PACERONE) 200 MG tablet Take 1 tablet (200 mg total) by mouth 2 (two) times daily.  Marland Kitchen apixaban (ELIQUIS) 5 MG TABS tablet Take 1 tablet (5 mg total) by mouth 2 (two) times daily.  . bisoprolol (ZEBETA) 5 MG tablet Take 0.5 tablets (2.5 mg total) by mouth daily. (Patient taking differently: Take 1.25 mg by mouth daily. )  . digoxin (LANOXIN) 0.125 MG tablet Take 1 tablet (0.125 mg total) by mouth daily.  Marland Kitchen losartan (COZAAR) 25 MG tablet Take 0.5 tablets (12.5 mg total) by mouth daily.  . potassium chloride SA (KLOR-CON) 20 MEQ tablet Take 1 tablet (20 mEq total) by mouth daily.  Marland Kitchen spironolactone (ALDACTONE) 25 MG tablet Take 1 tablet (25 mg total) by mouth daily.  Marland Kitchen torsemide (DEMADEX) 20 MG tablet Take 2 tablets (40 mg total) by mouth 2 (two) times daily.    Allergies: Patient has no known allergies.  Social History   Tobacco Use  . Smoking  status: Never Smoker  . Smokeless tobacco: Never Used  Substance Use Topics  . Alcohol use: Never    Frequency: Never  . Drug use: Never    Family History  Problem Relation Age of Onset  . Emphysema  Mother   . Cancer Father     Review of Systems: A 12-system review of systems was performed and was negative except as noted in the HPI.  --------------------------------------------------------------------------------------------------  Physical Exam: BP 120/84 (BP Location: Left Arm, Patient Position: Sitting, Cuff Size: Normal)   Pulse 63   Ht 5\' 8"  (1.727 m)   Wt 248 lb (112.5 kg)   SpO2 96%   BMI 37.71 kg/m   General: NAD. HEENT: No conjunctival pallor or scleral icterus.  Facemask in place. Neck: Supple without lymphadenopathy, thyromegaly, JVD, or HJR, though evaluation is limited by body habitus. Lungs: Normal work of breathing. Clear to auscultation bilaterally without wheezes or crackles. Heart: Regular rate and rhythm without murmurs, rubs, or gallops.  Unable to assess PMI due to body habitus. Abd: Bowel sounds present. Soft, NT/ND.  Unable to assess HSM due to body habitus. Ext: Trace pretibial edema bilaterally. Skin: Warm and dry without rash.  EKG: Normal sinus rhythm without abnormality.  Lab Results  Component Value Date   WBC 8.2 12/06/2018   HGB 15.4 12/06/2018   HCT 47.1 12/06/2018   MCV 91.5 12/06/2018   PLT 244 12/06/2018    Lab Results  Component Value Date   NA 138 12/07/2018   K 3.8 12/07/2018   CL 94 (L) 12/07/2018   CO2 29 12/07/2018   BUN 19 12/07/2018   CREATININE 1.55 (H) 12/07/2018   GLUCOSE 83 12/07/2018   ALT 24 11/30/2018    No results found for: CHOL, HDL, LDLCALC, LDLDIRECT, TRIG, CHOLHDL  --------------------------------------------------------------------------------------------------  ASSESSMENT AND PLAN: Chronic systolic heart failure: Kenneth Ferguson has trace pretibial edema on exam today but otherwise appears euvolemic.  He reports NYHA class II heart failure symptoms, which is a significant improvement since I last saw him in office and subsequent hospitalization.  I think it is reasonable to continue his current regimen  of bisoprolol 1.25 mg daily, digoxin 0.125 mg daily, losartan 12.5 mg daily, and spironolactone 25 mg daily.  His volume status is adequately controlled with torsemide 40 mg twice daily.  I will check a basic metabolic panel and digoxin level today.  If blood pressure tolerates, escalation of his evidence-based heart failure therapy should be pursued at his next visit.  We will plan for a limited echo 3 months after his most recent cardioversion.  If LVEF has not improved significantly, Kenneth Ferguson will need catheterization to exclude ischemic substrate, though I suspect his cardiomyopathy is nonischemic in nature.  Persistent atrial fibrillation: Kenneth Ferguson is maintaining sinus rhythm following amiodarone load and repeat cardioversion earlier this month.  I recommend decreasing amiodarone to 200 mg daily.  Kenneth Ferguson should continue apixaban 5 mg twice daily.  Follow-up: Return to clinic in 1 month.  Ramon Dredge, MD 12/19/2018 2:55 PM

## 2018-12-20 ENCOUNTER — Encounter: Payer: Self-pay | Admitting: Internal Medicine

## 2018-12-20 ENCOUNTER — Telehealth: Payer: Self-pay

## 2018-12-20 ENCOUNTER — Telehealth: Payer: 59 | Admitting: Internal Medicine

## 2018-12-20 MED ORDER — POTASSIUM CHLORIDE CRYS ER 20 MEQ PO TBCR: 40.0000 meq | EXTENDED_RELEASE_TABLET | Freq: Every day | ORAL | 5 refills | Status: DC

## 2018-12-20 NOTE — Telephone Encounter (Signed)
Patient seen in office on 12/18/18 with Dr End.

## 2018-12-20 NOTE — Telephone Encounter (Signed)
-----   Message from Nelva Bush, MD sent at 12/20/2018  7:30 AM EST ----- Please let [name] know that his kidney function has improved slightly.  His potassium is low normal and I suggest that we increase his potassium chloride to 40 mEq daily.  His digoxin level is acceptable.

## 2018-12-20 NOTE — Telephone Encounter (Signed)
Spoke to patient and reviewed results. Updated rx sent for K CL.   Advised pt to call for any further questions or concerns.

## 2018-12-31 ENCOUNTER — Other Ambulatory Visit: Payer: Self-pay | Admitting: Internal Medicine

## 2018-12-31 NOTE — Telephone Encounter (Signed)
°*  STAT* If patient is at the pharmacy, call can be transferred to refill team.   1. Which medications need to be refilled? (please list name of each medication and dose if known)  Torsemide 40 mg po BID  Losartan 12.5 mg po q d  Digoxin 0.125 mg po q d  Spironolactone 25 mg po BID    2. Which pharmacy/location (including street and city if local pharmacy) is medication to be sent to? [facility]   3. Do they need a 30 day or 90 day supply? 90   Patient is out of meds and is nervous about missing doses .

## 2019-01-01 MED ORDER — SPIRONOLACTONE 25 MG PO TABS: 25.0000 mg | ORAL_TABLET | Freq: Every day | ORAL | 0 refills | Status: DC

## 2019-01-01 MED ORDER — LOSARTAN POTASSIUM 25 MG PO TABS: 12.5000 mg | ORAL_TABLET | Freq: Every day | ORAL | 0 refills | Status: DC

## 2019-01-01 MED ORDER — DIGOXIN 125 MCG PO TABS: 0.1250 mg | ORAL_TABLET | Freq: Every day | ORAL | 0 refills | Status: DC

## 2019-01-01 MED ORDER — TORSEMIDE 20 MG PO TABS: 40.0000 mg | ORAL_TABLET | Freq: Two times a day (BID) | ORAL | 0 refills | Status: DC

## 2019-01-01 NOTE — Addendum Note (Signed)
Addended by: Raelene Bott, Demetress Tift L on: 01/01/2019 11:08 AM   Modules accepted: Orders

## 2019-01-01 NOTE — Telephone Encounter (Signed)
Patient requested 90 day supply.  Requested Prescriptions   Signed Prescriptions Disp Refills  . losartan (COZAAR) 25 MG tablet 45 tablet 0    Sig: Take 0.5 tablets (12.5 mg total) by mouth daily.    Authorizing Provider: END, CHRISTOPHER    Ordering User: NEWCOMER MCCLAIN, BRANDY L  . digoxin (LANOXIN) 0.125 MG tablet 90 tablet 0    Sig: Take 1 tablet (0.125 mg total) by mouth daily.    Authorizing Provider: END, CHRISTOPHER    Ordering User: NEWCOMER MCCLAIN, BRANDY L  . spironolactone (ALDACTONE) 25 MG tablet 90 tablet 0    Sig: Take 1 tablet (25 mg total) by mouth daily.    Authorizing Provider: END, CHRISTOPHER    Ordering User: NEWCOMER MCCLAIN, BRANDY L  . torsemide (DEMADEX) 20 MG tablet 360 tablet 0    Sig: Take 2 tablets (40 mg total) by mouth 2 (two) times daily.    Authorizing Provider: END, CHRISTOPHER    Ordering User: Raelene Bott, BRANDY L

## 2019-01-01 NOTE — Telephone Encounter (Signed)
Patient worried about missing doses .  Pharmacy has not received refills.  Please call patient to discuss .  Today is the only day due to work schedule that he can go get medication .

## 2019-01-10 NOTE — Progress Notes (Deleted)
Cardiology Office Note    Date:  01/10/2019   ID:  Kenneth Ferguson, DOB October 14, 1965, MRN [id number]  PCP:  Patient, [name]  Cardiologist:  Yvonne Kendall, MD  Electrophysiologist:  None   Chief Complaint: Follow up  History of Present Illness:   Kenneth Ferguson is a 53 y.o. 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 Fib as detailed below, left atrial appendage thrombus, questionable hypertrophic cardiomyopathy, and morbid obesity who presents for follow-up of his cardiomyopathy and Afib.   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  He reportedly was found to have a murmur on a racing physical and was referred to cardiology at that time.  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  Further details of this are not available.  Following this he did well up until 2015 when he developed new onset palpitations and was diagnosed with A. fib with RVR.  He underwent successful TEE guided cardioversion.  Following this procedure, he underwent diagnostic cardiac cath and was told he had normal coronaries, details are not clear.  He was later admitted to the hospital in 2018 with recurrent A. 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  In this setting, he was discharged wearing a LifeVest though self discontinued this.  Following that, he was lost to follow-up.  More recently, he suffered the unexpected loss of his wife in 06/2018.  Following this acute stressor, the patient felt like he went into A. fib.  Several weeks later he noted increased SOB/DOE.  He was treated at an outside urgent care for bronchitis without improvement in symptoms.  He subsequently developed bilateral lower extremity swelling and abdominal distention.  In this setting, he was admitted with volume overload and A. fib with RVR and placed on metoprolol for rate control.  Notes indicate the  patient self discontinued this medication as his heart rate on a pulse ox was noted to be in the 20s bpm per his report.  He was seen with a telephone visit on 07/31/2018 by Dr. Marney Setting with [facility] with recommendation to follow through with the prior recommended work-up including echo, outpatient cardiac monitoring, and cardiac MRI.  Patient did not follow through with this.  He was admitted to [facility] in 07/2018 with A. fib with RVR and acute systolic CHF.  TTE on 08/07/2018 showed severe hypokinesis of the left ventricle, entire anteroseptal wall, apical segment, and anterior wall, EF 40 to 45%, moderately increased LV wall thickness, moderately reduced RVSF, mild increased RV wall thickness, moderately dilated left atrium, moderate pericardial effusion posterior and lateral to the left ventricle, degenerative mitral valve with thickening, tricuspid aortic valve with mild calcification, aortic root normal in size and structure.  Plans were for a TEE guided cardioversion however the TEE on 08/08/18 showed a left atrial appendage thrombus leading to cancellation of the cardioversion.  He was gently diuresed with improvement in symptoms.  His ventricular rates remained difficult to control during his admission.   He subsequently underwent successful TEE-guided DCCV on 11/20/2018, with TEE demonstrated resolution of LAA thrombus prior.  LVEF was severely reduced on TEE along with a noted moderate pericardial effusion leading his Lasix to be escalated at that time. He was directly admitted to the hospital in follow up on 10/23 with recurrent Afib with RVR and massive volume overload. He required aggressive  diuresis with inotropic support.  He was loaded with amiodarone and underwent repeat DCCV prior to discharge. He was seen in follow up on 12/19/2018, and reported it was the best he had felt in 2-3 years. Weight was 248 pounds and he was compliant with his medications.   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     Past Surgical History:  Procedure Laterality Date   CARDIOVERSION N/A 08/08/2018   Procedure: CARDIOVERSION;  Surgeon: Minna Merritts, MD;  Location: [facility] ORS;  Service: Cardiovascular;  Laterality: N/A;   CARDIOVERSION N/A 11/20/2018   Procedure: CARDIOVERSION;  Surgeon: Nelva Bush, MD;  Location: [facility] ORS;  Service: Cardiovascular;  Laterality: N/A;   CARDIOVERSION N/A 11/20/2018   Procedure: CARDIOVERSION;  Surgeon: Nelva Bush, MD;  Location: [facility] ORS;  Service: Cardiovascular;  Laterality: N/A;   CARDIOVERSION N/A 12/07/2018   Procedure: CARDIOVERSION;  Surgeon: Minna Merritts, MD;  Location: [facility] ORS;  Service: Cardiovascular;  Laterality: N/A;   TEE WITHOUT CARDIOVERSION N/A 08/08/2018   Procedure: TRANSESOPHAGEAL ECHOCARDIOGRAM (TEE);  Surgeon: Minna Merritts, MD;  Location: [facility] ORS;  Service: Cardiovascular;  Laterality: N/A;   TEE WITHOUT CARDIOVERSION N/A 11/20/2018   Procedure: TRANSESOPHAGEAL ECHOCARDIOGRAM (TEE);  Surgeon: Nelva Bush, MD;  Location: [facility] ORS;  Service: Cardiovascular;  Laterality: N/A;    Current Medications: No outpatient medications have been marked as taking for the 01/18/19 encounter (Appointment) with Rise Mu, PA-C.    Allergies:   Patient has no known allergies.   Social History   Socioeconomic History   Marital status: Widowed    Spouse name: Not on file   Number of children: Not on file   Years of education: Not on file   Highest education level: Not on file  Occupational History   Not on file  Social Needs   Financial resource strain: Not on file     Food insecurity    Worry: Not on file    Inability: Not on file   Transportation needs    Medical: Not on file    Non-medical: Not on file  Tobacco Use   Smoking status: Never Smoker   Smokeless tobacco: Never Used  Substance and Sexual Activity   Alcohol use: Never    Frequency: Never   Drug use: Never   Sexual activity: Not on file  Lifestyle   Physical activity    Days per week: Not on file    Minutes per session: Not on file   Stress: Not on file  Relationships   Social connections    Talks on phone: Not on file    Gets together: Not on file    Attends religious service: Not on file    Active member of club or organization: Not on file    Attends meetings of clubs or organizations: Not on file    Relationship status: Not on file  Other Topics Concern   Not on file  Social History Narrative   Not on file     Family History:  The patient's family history includes Cancer in his father; Emphysema in his mother.  ROS:  ROS   EKGs/Labs/Other Studies Reviewed:    Studies reviewed were summarized above. The additional studies were reviewed today:  2D Echo 11/2018: 1. Left ventricular ejection fraction, by visual estimation, is 25 to 30%. The left ventricle has moderate to severely decreased function. Normal left ventricular size. There is moderately increased left ventricular hypertrophy.Severe hypokinesis of the  anterior, anteroseptal and apical walls  2. Global right ventricle has moderately reduced systolic function.The right ventricular size is normal. No increase in right ventricular wall thickness.  3. Left atrial size was moderately dilated.  4. Mildly elevated pulmonary artery systolic pressure.  5. Arrhythmia noted, rate 133 bpm   EKG:  EKG is ordered today.  The EKG ordered today demonstrates ***  Recent Labs: 08/06/2018: B Natriuretic Peptide 480.0 08/07/2018: TSH 2.126 11/30/2018: ALT 24 12/06/2018: Hemoglobin 15.4; Platelets  244 12/07/2018: Magnesium 2.1 12/19/2018: BUN 16; Creatinine, Ser 1.33; Potassium 3.7; Sodium 136  Recent Lipid Panel No results found for: CHOL, TRIG, HDL, CHOLHDL, VLDL, LDLCALC, LDLDIRECT  PHYSICAL EXAM:    VS:  There were no vitals taken for this visit.  BMI: There is no height or weight on file to calculate BMI.  Physical Exam  Wt Readings from Last 3 Encounters:  12/19/18 248 lb (112.5 kg)  12/07/18 242 lb 11.6 oz (110.1 kg)  11/30/18 278 lb (126.1 kg)     ASSESSMENT & PLAN:   1. ***  Disposition: F/u with Dr. Okey Dupre or an APP in ***.   Medication Adjustments/Labs and Tests Ordered: Current medicines are reviewed at length with the patient today.  Concerns regarding medicines are outlined above. Medication changes, Labs and Tests ordered today are summarized above and listed in the Patient Instructions accessible in Encounters.   Signed, Eula Listen, PA-C 01/10/2019 4:31 PM     [facility] [address] [place], [place] [address] [phone]

## 2019-01-14 ENCOUNTER — Other Ambulatory Visit: Payer: Self-pay

## 2019-01-14 MED ORDER — AMIODARONE HCL 200 MG PO TABS: 200.0000 mg | ORAL_TABLET | Freq: Every day | ORAL | 0 refills | Status: DC

## 2019-01-14 NOTE — Telephone Encounter (Signed)
*  STAT* If patient is at the pharmacy, call can be transferred to refill team.   1. Which medications need to be refilled? (please list name of each medication and dose if known) Amiodarone, Potassium, and Torsemide  2. Which pharmacy/location (including street and city if local pharmacy) is medication to be sent to? [place]  3. Do they need a 30 day or 90 day supply? [place]

## 2019-01-18 ENCOUNTER — Ambulatory Visit: Payer: 59 | Admitting: Physician Assistant

## 2019-01-22 ENCOUNTER — Telehealth: Payer: Self-pay | Admitting: Internal Medicine

## 2019-01-22 NOTE — Telephone Encounter (Signed)
Patient states his employer is encouraging him to take the COVID vaccine and would like to know Dr. Darnelle Bos thoughts on him taking this. Please call to discuss.

## 2019-01-23 NOTE — Telephone Encounter (Signed)
Patient calling to check on status.

## 2019-01-24 NOTE — Telephone Encounter (Signed)
Patient calling to check on status.

## 2019-01-24 NOTE — Telephone Encounter (Signed)
I will call him when possible later today or tomorrow.  Nelva Bush, MD [facility]

## 2019-01-24 NOTE — Telephone Encounter (Signed)
Patient returning call Informed patient that Dr End will be reaching out today or tomorrow to discuss  He will await call

## 2019-01-24 NOTE — Telephone Encounter (Signed)
No answer. Left message letting patient know Dr End will reach out to him today or tomorrow to discuss.

## 2019-01-25 NOTE — Telephone Encounter (Signed)
Risks and benefits of COVID vaccine were discussed with Kenneth Ferguson.  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  Nelva Bush, MD [facility]

## 2019-01-31 ENCOUNTER — Other Ambulatory Visit: Payer: Self-pay | Admitting: Physician Assistant

## 2019-01-31 MED ORDER — SPIRONOLACTONE 25 MG PO TABS: 25.0000 mg | ORAL_TABLET | Freq: Every day | ORAL | 0 refills | Status: DC

## 2019-01-31 MED ORDER — LOSARTAN POTASSIUM 25 MG PO TABS: 12.5000 mg | ORAL_TABLET | Freq: Every day | ORAL | 0 refills | Status: DC

## 2019-01-31 MED ORDER — AMIODARONE HCL 200 MG PO TABS: 200.0000 mg | ORAL_TABLET | Freq: Every day | ORAL | 0 refills | Status: DC

## 2019-01-31 MED ORDER — POTASSIUM CHLORIDE CRYS ER 20 MEQ PO TBCR: 40.0000 meq | EXTENDED_RELEASE_TABLET | Freq: Every day | ORAL | 5 refills | Status: DC

## 2019-01-31 MED ORDER — TORSEMIDE 20 MG PO TABS: 40.0000 mg | ORAL_TABLET | Freq: Two times a day (BID) | ORAL | 0 refills | Status: DC

## 2019-01-31 MED ORDER — DIGOXIN 125 MCG PO TABS: 0.1250 mg | ORAL_TABLET | Freq: Every day | ORAL | 0 refills | Status: DC

## 2019-02-11 NOTE — Progress Notes (Deleted)
Cardiology Office Note    Date:  02/11/2019   ID:  Kenneth Ferguson, DOB Jan 09, 1966, MRN [id number]  PCP:  Patient, [name]  Cardiologist:  Nelva Bush, MD  Electrophysiologist:  None   Chief Complaint: ***  History of Present Illness:   Kenneth Ferguson is a 54 y.o. 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 Fib as detailed below, left atrial appendage thrombus, questionable hypertrophic cardiomyopathy, and morbid obesity who presents for follow-up of his cardiomyopathy and Afib.   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  He reportedly was found to have a murmur on a racing physical and was referred to cardiology at that time.  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  Further details of this are not available.  Following this he did well up until 2015 when he developed new onset palpitations and was diagnosed with A. fib with RVR.  He underwent successful TEE guided cardioversion.  Following this procedure, he underwent diagnostic cardiac cath and was told he had normal coronaries, details are not clear.  He was later admitted to the hospital in 2018 with recurrent A. fib with note from [facility] indicating the patient was found to have HOCM on echo.  In this setting, he was discharged wearing a LifeVest though self discontinued this.  Following that, he was lost to follow-up.  More recently, he suffered the unexpected loss of his wife in 06/2018.  Following this acute stressor, the patient felt like he went into A. fib.  Several weeks later he noted increased SOB/DOE.  He was treated at an outside urgent care for bronchitis without improvement in symptoms.  He subsequently developed bilateral lower extremity swelling and abdominal distention.  In this setting, he was admitted with volume overload and A. fib with RVR and placed on metoprolol for rate control.  Notes indicate the  patient self discontinued this medication as his heart rate on a pulse ox was noted to be in the 20s bpm per his report.  He was seen with a telephone visit on 07/31/2018 by Dr. Christella Scheuermann with [facility] with recommendation to follow through with the prior recommended work-up including echo, outpatient cardiac monitoring, and cardiac MRI.  Patient did not follow through with this.  He was admitted to [facility] in 07/2018 with A. fib with RVR and acute systolic CHF.  TTE on 08/07/2018 showed severe hypokinesis of the left ventricle, entire anteroseptal wall, apical segment, and anterior wall, EF 40 to 45%, moderately increased LV wall thickness, moderately reduced RVSF, mild increased RV wall thickness, moderately dilated left atrium, moderate pericardial effusion posterior and lateral to the left ventricle, degenerative mitral valve with thickening, tricuspid aortic valve with mild calcification, aortic root normal in size and structure.  Plans were for a TEE guided cardioversion however the TEE on 08/08/18 showed a left atrial appendage thrombus leading to cancellation of the cardioversion.  He was gently diuresed with improvement in symptoms.  His ventricular rates remained difficult to control during his admission.   He subsequently underwent successful TEE-guided DCCV on 11/20/2018, with TEE demonstrated resolution of LAA thrombus prior.  LVEF was severely reduced on TEE along with a noted moderate pericardial effusion leading his Lasix to be escalated at that time. He was directly admitted to the hospital in follow up on 10/23 with recurrent Afib with RVR and massive volume overload. He required aggressive diuresis  with inotropic support.  He was loaded with amiodarone and underwent repeat DCCV prior to discharge. He was seen in follow up on 12/19/2018, and reported it was the best he had felt in 2-3 years. Weight was 248 pounds and he was compliant with his medications.   ***   Labs independently  reviewed: 12/2018 - potassium 3.7, BUN 16, SCr 1.33, digoxin 1.1 11/2018 - magnesium 2.1, HGB 15.4, PLT 244, albumin 3.6, AST/ALT normal 07/2018 - TSH normal, A1c 4.8   Past Medical History:  Diagnosis Date  . HFrEF (heart failure with reduced ejection fraction) (HCC)   . Persistent atrial fibrillation (HCC)   . 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 CARDIOVERSION N/A 08/08/2018   Procedure: CARDIOVERSION;  Surgeon: Antonieta Iba, MD;  Location: [facility] ORS;  Service: Cardiovascular;  Laterality: N/A;  . CARDIOVERSION N/A 11/20/2018   Procedure: CARDIOVERSION;  Surgeon: Yvonne Kendall, MD;  Location: [facility] ORS;  Service: Cardiovascular;  Laterality: N/A;  . CARDIOVERSION N/A 11/20/2018   Procedure: CARDIOVERSION;  Surgeon: Yvonne Kendall, MD;  Location: [facility] ORS;  Service: Cardiovascular;  Laterality: N/A;  . CARDIOVERSION N/A 12/07/2018   Procedure: CARDIOVERSION;  Surgeon: Antonieta Iba, MD;  Location: [facility] ORS;  Service: Cardiovascular;  Laterality: N/A;  . TEE WITHOUT CARDIOVERSION N/A 08/08/2018   Procedure: TRANSESOPHAGEAL ECHOCARDIOGRAM (TEE);  Surgeon: Antonieta Iba, MD;  Location: [facility] ORS;  Service: Cardiovascular;  Laterality: N/A;  . TEE WITHOUT CARDIOVERSION N/A 11/20/2018   Procedure: TRANSESOPHAGEAL ECHOCARDIOGRAM (TEE);  Surgeon: Yvonne Kendall, MD;  Location: [facility] ORS;  Service: Cardiovascular;  Laterality: N/A;    Current Medications: No outpatient medications have been marked as taking for the 02/15/19 encounter (Appointment) with Sondra Barges, PA-C.    Allergies:   Patient has no known allergies.   Social History   Socioeconomic History  . Marital status: Widowed    Spouse name: Not on file  . Number of children: Not on file  . Years of education: Not on file  . Highest education level: Not on file  Occupational History  . Not on file  Tobacco Use  .  Smoking status: Never Smoker  . Smokeless tobacco: Never Used  Substance and Sexual Activity  . Alcohol use: Never  . Drug use: Never  . Sexual activity: Not on file  Other Topics Concern  . Not on file  Social History Narrative  . Not on file   Social Determinants of Health   Financial Resource Strain:   . Difficulty of Paying Living Expenses: Not on file  Food Insecurity:   . Worried About Programme researcher, broadcasting/film/video in the Last Year: Not on file  . Ran Out of Food in the Last Year: Not on file  Transportation Needs:   . Lack of Transportation (Medical): Not on file  . Lack of Transportation (Non-Medical): Not on file  Physical Activity:   . Days of Exercise per Week: Not on file  . Minutes of Exercise per Session: Not on file  Stress:   . Feeling of Stress : Not on file  Social Connections:   . Frequency of Communication with Friends and Family: Not on file  . Frequency of Social Gatherings with Friends and Family: Not on file  . Attends Religious Services: Not on file  . Active Member of Clubs or Organizations: Not on file  . Attends Banker Meetings: Not on file  .  Marital Status: Not on file     Family History:  The patient's family history includes Cancer in his father; Emphysema in his mother.  ROS:   ROS   EKGs/Labs/Other Studies Reviewed:    Studies reviewed were summarized above. The additional studies were reviewed today:  2D Echo 11/2018: 1. Left ventricular ejection fraction, by visual estimation, is 25 to 30%. The left ventricle has moderate to severely decreased function. Normal left ventricular size. There is moderately increased left ventricular hypertrophy.Severe hypokinesis of the  anterior, anteroseptal and apical walls  2. Global right ventricle has moderately reduced systolic function.The right ventricular size is normal. No increase in right ventricular wall thickness.  3. Left atrial size was moderately dilated.  4. Mildly elevated  pulmonary artery systolic pressure.  5. Arrhythmia noted, rate 133 bpm   EKG:  EKG is ordered today.  The EKG ordered today demonstrates ***  Recent Labs: 08/06/2018: B Natriuretic Peptide 480.0 08/07/2018: TSH 2.126 11/30/2018: ALT 24 12/06/2018: Hemoglobin 15.4; Platelets 244 12/07/2018: Magnesium 2.1 12/19/2018: BUN 16; Creatinine, Ser 1.33; Potassium 3.7; Sodium 136  Recent Lipid Panel No results found for: CHOL, TRIG, HDL, CHOLHDL, VLDL, LDLCALC, LDLDIRECT  PHYSICAL EXAM:    VS:  There were no vitals taken for this visit.  BMI: There is no height or weight on file to calculate BMI.  Physical Exam  Wt Readings from Last 3 Encounters:  12/19/18 248 lb (112.5 kg)  12/07/18 242 lb 11.6 oz (110.1 kg)  11/30/18 278 lb (126.1 kg)     ASSESSMENT & PLAN:   1. ***  Disposition: F/u with Dr. Okey Dupre or an APP in ***.   Medication Adjustments/Labs and Tests Ordered: Current medicines are reviewed at length with the patient today.  Concerns regarding medicines are outlined above. Medication changes, Labs and Tests ordered today are summarized above and listed in the Patient Instructions accessible in Encounters.   Signed, Eula Listen, PA-C 02/11/2019 4:00 PM     [facility] [address] [place], [place] [address] [phone]

## 2019-02-15 ENCOUNTER — Ambulatory Visit: Payer: 59 | Admitting: Physician Assistant

## 2019-02-25 NOTE — Progress Notes (Deleted)
Cardiology Office Note    Date:  02/25/2019   ID:  Kenneth Ferguson, DOB Aug 06, 1965, MRN [id number]  PCP:  Patient, [name]  Cardiologist:  Nelva Bush, MD  Electrophysiologist:  None   Chief Complaint: ***  History of Present Illness:   Kenneth Ferguson is a 54 y.o. 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 Fib as detailed below, left atrial appendage thrombus, questionable hypertrophic cardiomyopathy, and morbid obesity who presents for follow-up of his cardiomyopathy and Afib.   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  He reportedly was found to have a murmur on a racing physical and was referred to cardiology at that time.  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  Further details of this are not available.  Following this he did well up until 2015 when he developed new onset palpitations and was diagnosed with A. fib with RVR.  He underwent successful TEE guided cardioversion.  Following this procedure, he underwent diagnostic cardiac cath and was told he had normal coronaries, details are not clear.  He was later admitted to the hospital in 2018 with recurrent A. fib with note from [place] indicating the patient was found to have HOCM on echo.  In this setting, he was discharged wearing a LifeVest though self discontinued this.  Following that, he was lost to follow-up.  More recently, he suffered the unexpected loss of his wife in 06/2018.  Following this acute stressor, the patient felt like he went into A. fib.  Several weeks later he noted increased SOB/DOE.  He was treated at an outside urgent care for bronchitis without improvement in symptoms.  He subsequently developed bilateral lower extremity swelling and abdominal distention.  In this setting, he was admitted with volume overload and A. fib with RVR and placed on metoprolol for rate control.  Notes indicate the  patient self discontinued this medication as his heart rate on a pulse ox was noted to be in the 20s bpm per his report.  He was seen with a telephone visit on 07/31/2018 by Dr. Christella Scheuermann with [facility] with recommendation to follow through with the prior recommended work-up including echo, outpatient cardiac monitoring, and cardiac MRI.  Patient did not follow through with this.  He was admitted to [facility] in 07/2018 with A. fib with RVR and acute systolic CHF.  TTE on 08/07/2018 showed severe hypokinesis of the left ventricle, entire anteroseptal wall, apical segment, and anterior wall, EF 40 to 45%, moderately increased LV wall thickness, moderately reduced RVSF, mild increased RV wall thickness, moderately dilated left atrium, moderate pericardial effusion posterior and lateral to the left ventricle, degenerative mitral valve with thickening, tricuspid aortic valve with mild calcification, aortic root normal in size and structure.  Plans were for a TEE guided cardioversion however the TEE on 08/08/18 showed a left atrial appendage thrombus leading to cancellation of the cardioversion.  He was gently diuresed with improvement in symptoms.  His ventricular rates remained difficult to control during his admission.   He subsequently underwent successful TEE-guided DCCV on 11/20/2018, with TEE demonstrated resolution of LAA thrombus prior.  LVEF was severely reduced on TEE along with a noted moderate pericardial effusion leading his Lasix to be escalated at that time. He was directly admitted to the hospital in follow up on 10/23 with recurrent Afib with RVR and massive volume overload. He required aggressive diuresis  with inotropic support.  He was loaded with amiodarone and underwent repeat DCCV prior to discharge. He was seen in follow up on 12/19/2018, and reported it was the best he had felt in 2-3 years. Weight was 248 pounds and he was compliant with his medications.   ***   Labs independently  reviewed: 12/2018 - potassium 3.7, BUN 16, SCr 1.33, digoxin 1.1 11/2018 - magnesium 2.1, HGB 15.4, PLT 244, albumin 3.6, AST/ALT normal 07/2018 - TSH normal, A1c 4.8  Past Medical History:  Diagnosis Date  . HFrEF (heart failure with reduced ejection fraction) (HCC)   . Persistent atrial fibrillation (HCC)   . 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 CARDIOVERSION N/A 08/08/2018   Procedure: CARDIOVERSION;  Surgeon: Antonieta Iba, MD;  Location: [facility] ORS;  Service: Cardiovascular;  Laterality: N/A;  . CARDIOVERSION N/A 11/20/2018   Procedure: CARDIOVERSION;  Surgeon: Yvonne Kendall, MD;  Location: [facility] ORS;  Service: Cardiovascular;  Laterality: N/A;  . CARDIOVERSION N/A 11/20/2018   Procedure: CARDIOVERSION;  Surgeon: Yvonne Kendall, MD;  Location: [facility] OR[facility];  Service: Cardiovascular;  Laterality: N/A;  . CARDIOVERSION N/A 12/07/2018   Procedure: CARDIOVERSION;  Surgeon: Antonieta Iba, MD;  Location: [facility] ORS;  Service: Cardiovascular;  Laterality: N/A;  . TEE WITHOUT CARDIOVERSION N/A 08/08/2018   Procedure: TRANSESOPHAGEAL ECHOCARDIOGRAM (TEE);  Surgeon: Antonieta Iba, MD;  Location: [facility] OR[facility];  Service: Cardiovascular;  Laterality: N/A;  . TEE WITHOUT CARDIOVERSION N/A 11/20/2018   Procedure: TRANSESOPHAGEAL ECHOCARDIOGRAM (TEE);  Surgeon: Yvonne Kendall, MD;  Location: [facility] OR[facility];  Service: Cardiovascular;  Laterality: N/A;    Current Medications: No outpatient medications have been marked as taking for the 02/27/19 encounter (Appointment) with Sondra Barges, PA-C.    Allergies:   Patient has no known allergies.   Social History   Socioeconomic History  . Marital status: Widowed    Spouse name: Not on file  . Number of children: Not on file  . Years of education: Not on file  . Highest education level: Not on file  Occupational History  . Not on file  Tobacco Use  .  Smoking status: Never Smoker  . Smokeless tobacco: Never Used  Substance and Sexual Activity  . Alcohol use: Never  . Drug use: Never  . Sexual activity: Not on file  Other Topics Concern  . Not on file  Social History Narrative  . Not on file   Social Determinants of Health   Financial Resource Strain:   . Difficulty of Paying Living Expenses: Not on file  Food Insecurity:   . Worried About Programme researcher, broadcasting/film/video in the Last Year: Not on file  . Ran Out of Food in the Last Year: Not on file  Transportation Needs:   . Lack of Transportation (Medical): Not on file  . Lack of Transportation (Non-Medical): Not on file  Physical Activity:   . Days of Exercise per Week: Not on file  . Minutes of Exercise per Session: Not on file  Stress:   . Feeling of Stress : Not on file  Social Connections:   . Frequency of Communication with Friends and Family: Not on file  . Frequency of Social Gatherings with Friends and Family: Not on file  . Attends Religious Services: Not on file  . Active Member of Clubs or Organizations: Not on file  . Attends Banker Meetings: Not on file  .  Marital Status: Not on file     Family History:  The patient's family history includes Cancer in his father; Emphysema in his mother.  ROS:   ROS   EKGs/Labs/Other Studies Reviewed:    Studies reviewed were summarized above. The additional studies were reviewed today:  2D Echo 11/2018: 1. Left ventricular ejection fraction, by visual estimation, is 25 to 30%. The left ventricle has moderate to severely decreased function. Normal left ventricular size. There is moderately increased left ventricular hypertrophy.Severe hypokinesis of the  anterior, anteroseptal and apical walls  2. Global right ventricle has moderately reduced systolic function.The right ventricular size is normal. No increase in right ventricular wall thickness.  3. Left atrial size was moderately dilated.  4. Mildly elevated  pulmonary artery systolic pressure.  5. Arrhythmia noted, rate 133 bpm   EKG:  EKG is ordered today.  The EKG ordered today demonstrates ***  Recent Labs: 08/06/2018: B Natriuretic Peptide 480.0 08/07/2018: TSH 2.126 11/30/2018: ALT 24 12/06/2018: Hemoglobin 15.4; Platelets 244 12/07/2018: Magnesium 2.1 12/19/2018: BUN 16; Creatinine, Ser 1.33; Potassium 3.7; Sodium 136  Recent Lipid Panel No results found for: CHOL, TRIG, HDL, CHOLHDL, VLDL, LDLCALC, LDLDIRECT  PHYSICAL EXAM:    VS:  There were no vitals taken for this visit.  BMI: There is no height or weight on file to calculate BMI.  Physical Exam  Wt Readings from Last 3 Encounters:  12/19/18 248 lb (112.5 kg)  12/07/18 242 lb 11.6 oz (110.1 kg)  11/30/18 278 lb (126.1 kg)     ASSESSMENT & PLAN:   1. ***  Disposition: F/u with Dr. Okey Dupre or an APP in ***.   Medication Adjustments/Labs and Tests Ordered: Current medicines are reviewed at length with the patient today.  Concerns regarding medicines are outlined above. Medication changes, Labs and Tests ordered today are summarized above and listed in the Patient Instructions accessible in Encounters.   Signed, Eula Listen, PA-C 02/25/2019 7:49 AM     [facility] [place] [address] [place], [place] [address] [phone]

## 2019-02-27 ENCOUNTER — Other Ambulatory Visit: Payer: Self-pay | Admitting: Internal Medicine

## 2019-02-27 ENCOUNTER — Other Ambulatory Visit: Payer: Self-pay

## 2019-02-27 ENCOUNTER — Ambulatory Visit: Payer: 59 | Admitting: Physician Assistant

## 2019-02-27 MED ORDER — SPIRONOLACTONE 25 MG PO TABS: 25.0000 mg | ORAL_TABLET | Freq: Every day | ORAL | 0 refills | Status: DC

## 2019-02-27 MED ORDER — DIGOXIN 125 MCG PO TABS: 0.1250 mg | ORAL_TABLET | Freq: Every day | ORAL | 0 refills | Status: DC

## 2019-02-27 MED ORDER — LOSARTAN POTASSIUM 25 MG PO TABS: 12.5000 mg | ORAL_TABLET | Freq: Every day | ORAL | 0 refills | Status: DC

## 2019-02-27 NOTE — Telephone Encounter (Signed)
*  STAT* If patient is at the pharmacy, call can be transferred to refill team.   1. Which medications need to be refilled? (please list name of each medication and dose if known) spironolactone 25 mg, digoxin .125 mg, losartan 25 mg  2. Which pharmacy/location (including street and city if local pharmacy) is medication to be sent to?[facility] on [address]  3. Do they need a 30 day or 90 day supply? 90

## 2019-02-27 NOTE — Telephone Encounter (Signed)
Requested Prescriptions   Signed Prescriptions Disp Refills   digoxin (LANOXIN) 0.125 MG tablet 90 tablet 0    Sig: Take 1 tablet (0.125 mg total) by mouth daily.    Authorizing Provider: END, CHRISTOPHER    Ordering User: NEWCOMER MCCLAIN, Railyn House L   losartan (COZAAR) 25 MG tablet 45 tablet 0    Sig: Take 0.5 tablets (12.5 mg total) by mouth daily.    Authorizing Provider: END, CHRISTOPHER    Ordering User: Thayer Headings, Teya Otterson L   spironolactone (ALDACTONE) 25 MG tablet 90 tablet 0    Sig: Take 1 tablet (25 mg total) by mouth daily.    Authorizing Provider: END, CHRISTOPHER    Ordering User: Thayer Headings, Arbadella Kimbler L

## 2019-02-28 ENCOUNTER — Other Ambulatory Visit: Payer: Self-pay | Admitting: Internal Medicine

## 2019-02-28 MED ORDER — AMIODARONE HCL 200 MG PO TABS: 200.0000 mg | ORAL_TABLET | Freq: Every day | ORAL | 0 refills | Status: DC

## 2019-02-28 NOTE — Telephone Encounter (Signed)
Requested Prescriptions   Signed Prescriptions Disp Refills   amiodarone (PACERONE) 200 MG tablet 90 tablet 0    Sig: Take 1 tablet (200 mg total) by mouth daily.    Authorizing Provider: END, CHRISTOPHER    Ordering User: Thayer Headings, Luria Rosario L

## 2019-02-28 NOTE — Telephone Encounter (Signed)
°*  STAT* If patient is at the pharmacy, call can be transferred to refill team.   1. Which medications need to be refilled? (please list name of each medication and dose if known)  Amiodarone 200 mg po q d   2. Which pharmacy/location (including street and city if local pharmacy) is medication to be sent to? [id number] [place], [place] - [address] AT [facility] [address]  [address] [place], [place] [address]   3. Do they need a 30 day or 90 day supply?  90   PATIENT IS OUT AND NEEDS FOR TODAY

## 2019-03-05 ENCOUNTER — Other Ambulatory Visit: Payer: Self-pay

## 2019-03-05 ENCOUNTER — Ambulatory Visit (INDEPENDENT_AMBULATORY_CARE_PROVIDER_SITE_OTHER): Payer: 59 | Admitting: Family

## 2019-03-05 ENCOUNTER — Encounter: Payer: Self-pay | Admitting: Family

## 2019-03-05 VITALS — BP 110/64 | HR 66 | Ht 68.0 in | Wt 262.5 lb

## 2019-03-05 DIAGNOSIS — Z79899 Other long term (current) drug therapy: Secondary | ICD-10-CM

## 2019-03-05 DIAGNOSIS — I428 Other cardiomyopathies: Secondary | ICD-10-CM

## 2019-03-05 DIAGNOSIS — Z7901 Long term (current) use of anticoagulants: Secondary | ICD-10-CM | POA: Diagnosis not present

## 2019-03-05 DIAGNOSIS — I5022 Chronic systolic (congestive) heart failure: Secondary | ICD-10-CM | POA: Diagnosis not present

## 2019-03-05 DIAGNOSIS — I4819 Other persistent atrial fibrillation: Secondary | ICD-10-CM | POA: Diagnosis not present

## 2019-03-05 NOTE — Patient Instructions (Addendum)
Medication Instructions:  No medication changes today.  If your pumping function is still reduced we will consider switching your Losartan to Entresto.  *If you need a refill on your cardiac medications before your next appointment, please call your pharmacy*  Lab Work: None today.   Testing/Procedures: Your physician has requested that you have an echocardiogram. Echocardiography is a painless test that uses sound waves to create images of your heart. It provides your doctor with information about the size and shape of your heart and how well your heart's chambers and valves are working. This procedure takes approximately one hour. There are no restrictions for this procedure.  Follow-Up: At [facility], you and your health needs are our priority.  As part of our continuing mission to provide you with exceptional heart care, we have created designated Provider Care Teams.  These Care Teams include your primary Cardiologist (physician) and Advanced Practice Providers (APPs -  Physician Assistants and Nurse Practitioners) who all work together to provide you with the care you need, when you need it.  Your next appointment:   2 month(s)  The format for your next appointment:   In Person  Provider:    You may see Yvonne Kendall, MD or one of the following Advanced Practice Providers on your designated Care Team:    Nicolasa Ducking, NP  Eula Listen, PA-C  Marisue Ivan, PA-C  Other Instructions   Echocardiogram An echocardiogram is a procedure that uses painless sound waves (ultrasound) to produce an image of the heart. Images from an echocardiogram can provide important information about:  Signs of coronary artery disease (CAD).  Aneurysm detection. An aneurysm is a weak or damaged part of an artery wall that bulges out from the normal force of blood pumping through the body.  Heart size and shape. Changes in the size or shape of the heart can be associated with certain  conditions, including heart failure, aneurysm, and CAD.  Heart muscle function.  Heart valve function.  Signs of a past heart attack.  Fluid buildup around the heart.  Thickening of the heart muscle.  A tumor or infectious growth around the heart valves. Tell a health care provider about:  Any allergies you have.  All medicines you are taking, including vitamins, herbs, eye drops, creams, and over-the-counter medicines.  Any blood disorders you have.  Any surgeries you have had.  Any medical conditions you have.  Whether you are pregnant or may be pregnant. What are the risks? Generally, this is a safe procedure. However, problems may occur, including:  Allergic reaction to dye (contrast) that may be used during the procedure. What happens before the procedure? No specific preparation is needed. You may eat and drink normally. What happens during the procedure?   An IV tube may be inserted into one of your veins.  You may receive contrast through this tube. A contrast is an injection that improves the quality of the pictures from your heart.  A gel will be applied to your chest.  A wand-like tool (transducer) will be moved over your chest. The gel will help to transmit the sound waves from the transducer.  The sound waves will harmlessly bounce off of your heart to allow the heart images to be captured in real-time motion. The images will be recorded on a computer. The procedure may vary among health care providers and hospitals. What happens after the procedure?  You may return to your normal, everyday life, including diet, activities, and medicines, unless  your health care provider tells you not to do that. Summary  An echocardiogram is a procedure that uses painless sound waves (ultrasound) to produce an image of the heart.  Images from an echocardiogram can provide important information about the size and shape of your heart, heart muscle function, heart valve  function, and fluid buildup around your heart.  You do not need to do anything to prepare before this procedure. You may eat and drink normally.  After the echocardiogram is completed, you may return to your normal, everyday life, unless your health care provider tells you not to do that. This information is not intended to replace advice given to you by your health care provider. Make sure you discuss any questions you have with your health care provider. Document Revised: 05/17/2018 Document Reviewed: 02/27/2016 Elsevier Patient Education  [place].

## 2019-03-05 NOTE — Progress Notes (Signed)
Office Visit    Patient Name: Kenneth Ferguson Date of Encounter: 03/05/2019  Primary Care Provider:  Patient, [name] Primary Cardiologist:  Nelva Bush, MD Electrophysiologist:  None   Chief Complaint    Kenneth Ferguson is a 54 y.o. male with a hx of  chronic systolic heart failure, persistent atrial fibrillation on digoxin, amiodarone, and Eliquis, question of hypertrophic cardiomyopathy  presents today for follow-up of heart failure.  Past Medical History    Past Medical History:  Diagnosis Date  . HFrEF (heart failure with reduced ejection fraction) ([place])   . Persistent atrial fibrillation ([place])   . 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 CARDIOVERSION N/A 08/08/2018   Procedure: CARDIOVERSION;  Surgeon: Minna Merritts, MD;  Location: [facility] ORS;  Service: Cardiovascular;  Laterality: N/A;  . CARDIOVERSION N/A 11/20/2018   Procedure: CARDIOVERSION;  Surgeon: Nelva Bush, MD;  Location: [facility] OR[facility];  Service: Cardiovascular;  Laterality: N/A;  . CARDIOVERSION N/A 11/20/2018   Procedure: CARDIOVERSION;  Surgeon: Nelva Bush, MD;  Location: [facility] OR[facility];  Service: Cardiovascular;  Laterality: N/A;  . CARDIOVERSION N/A 12/07/2018   Procedure: CARDIOVERSION;  Surgeon: Minna Merritts, MD;  Location: [facility] OR[facility];  Service: Cardiovascular;  Laterality: N/A;  . TEE WITHOUT CARDIOVERSION N/A 08/08/2018   Procedure: TRANSESOPHAGEAL ECHOCARDIOGRAM (TEE);  Surgeon: Minna Merritts, MD;  Location: [facility] OR[facility];  Service: Cardiovascular;  Laterality: N/A;  . TEE WITHOUT CARDIOVERSION N/A 11/20/2018   Procedure: TRANSESOPHAGEAL ECHOCARDIOGRAM (TEE);  Surgeon: Nelva Bush, MD;  Location: [facility] OR[facility];  Service: Cardiovascular;  Laterality: N/A;   Allergies No Known Allergies  History of Present Illness    Kenneth Ferguson is a 54 y.o. male with a hx of chronic systolic heart failure, persistent  atrial fibrillation on digoxin, amiodarone, and Eliquis, left atrial appendage thrombus with resolution, question of hypertrophic cardiomyopathy.  He was last seen 12/19/2018 by Dr. Saunders Revel.  Cardiac history back to early 2011 [place].  Reported murmur on routine physical and referred to cardiology.  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  No further details available.  Did well until 2015-was diagnosed with atrial fibrillation.  Underwent successful TEE guided cardioversion at outside hospital.  Per his report diagnostic cath was performed sometime afterward and was told he had normal coronary arteries.  2019 admitted with recurrent atrial fibrillation at which time he was apparently diagnosed with hokum ([facility]).  Life vest placed which she subsequently self discontinued.  May 2020 suffered unexpected loss of his wife which resulted in acute stress reaction and recurrent atrial fibrillation.  Subsequently noted worsening dyspnea and bilateral lower extremity edema.  Seen in outside hospital and diagnosed with atrial fib with RVR placed on beta-blocker.  Self discontinued beta-blocker due to concerns of bradycardia with home pulse ox with heart rates reportedly as 20s.  Seen by cardiology in [place] 07/2018 with recommendation for work-up including echo, outpatient cardiac monitoring, cardiac MRI.  He did not follow through and was admitted to [facility] 08/06/2018 with A. fib RVR and acute systolic heart failure.  Echo 08/07/2018 with severe hypokinesis of LV, entire anteroseptal wall, apical posterior segment, anterior wall.  EF 40 to 45% with moderately increased LV wall thickness, moderately reduced RV systolic function, mildly increased RV wall thickness, moderately dilated LA, moderate pericardial effusion posterior and lateral to the LV.  TEE 08/08/2018 with left atrial appendage thrombus and thus he was unable to be cardioverted.  His atrial fibrillation has been persistent. Repeat TEE with resolution of left  atrial appendage thrombus on 11/20/18.  Seen 11/30/2018 by Dr. Okey Dupre found to be volume overloaded and in atrial rib with RVR.  Directly admitted to [facility] for diuresis with inotropic support.  Loaded with amiodarone underwent repeat cardioversion 12/07/18 prior to discharge.  Hospital discharge complicated by AKI in the setting of low output heart failure.  12/20/18 his potassium was increased to daily for low normal level.   Reports feeling well. No chest pain, pressure, tightness. Reports no palpitations nor recurrence of atrial fibrillation. Denies bleeding complications. Is pleased with how he has felt on present medication regimen.  He had questions about Amiodarone and possible long term side effects. We discussed that his liver, thyroid, eyes, and lungs would need to be monitored. Tells me he gets his eye exam annually and just had one done.   Tells me he has picked up a little bit of weight due to eating regular meals and not being at the gym. Is very interested in returning to cardio workouts at the gym. Has second dose of vaccine next week and I recommended waiting a few more weeks to be in a gym for risk of COVID19. Is hopeful to get in shape for racing season.   EKGs/Labs/Other Studies Reviewed:   The following studies were reviewed today: Echo 11/30/18 1. Left ventricular ejection fraction, by visual estimation, is 25 to 30%. The left ventricle has moderate to severely decreased function. Normal left ventricular size. There is moderately increased left ventricular hypertrophy.Severe hypokinesis of the  anterior, anteroseptal and apical walls  2. Global right ventricle has moderately reduced systolic function.The right ventricular size is normal. No increase in right ventricular wall thickness.  3. Left atrial size was moderately dilated.  4. Mildly elevated pulmonary artery systolic pressure.  5. Arrhythmia noted, rate 133 bpm  EKG:  EKG is ordered today.  The ekg ordered today  demonstrates sinus rhythm 66 bpm with no acute ST/T wave changes.  Recent Labs: 08/06/2018: B Natriuretic Peptide 480.0 08/07/2018: TSH 2.126 11/30/2018: ALT 24 12/06/2018: Hemoglobin 15.4; Platelets 244 12/07/2018: Magnesium 2.1 12/19/2018: BUN 16; Creatinine, Ser 1.33; Potassium 3.7; Sodium 136  Recent Lipid Panel No results found for: CHOL, TRIG, HDL, CHOLHDL, VLDL, LDLCALC, LDLDIRECT  Home Medications   Current Meds  Medication Sig  . amiodarone (PACERONE) 200 MG tablet Take 1 tablet (200 mg total) by mouth daily.  Marland Kitchen apixaban (ELIQUIS) 5 MG TABS tablet Take 1 tablet (5 mg total) by mouth 2 (two) times daily.  . bisoprolol (ZEBETA) 5 MG tablet Take 2.5 mg by mouth daily.  . digoxin (LANOXIN) 0.125 MG tablet Take 1 tablet (0.125 mg total) by mouth daily.  Marland Kitchen losartan (COZAAR) 25 MG tablet Take 0.5 tablets (12.5 mg total) by mouth daily.  . potassium chloride SA (KLOR-CON) 20 MEQ tablet Take 2 tablets (40 mEq total) by mouth daily.  Marland Kitchen spironolactone (ALDACTONE) 25 MG tablet Take 1 tablet (25 mg total) by mouth daily.  Marland Kitchen torsemide (DEMADEX) 20 MG tablet Take 2 tablets (40 mg total) by mouth 2 (two) times daily.    Review of Systems    Review of Systems  Constitution: Negative for chills, fever and malaise/fatigue.  Cardiovascular: Negative for chest pain, dyspnea on exertion, irregular heartbeat, leg swelling, near-syncope, orthopnea, palpitations and syncope.  Respiratory: Negative for cough, shortness of breath and wheezing.   Gastrointestinal: Negative for melena, nausea and vomiting.  Genitourinary: Negative for hematuria.  Neurological: Negative for dizziness, light-headedness and weakness.   All other systems reviewed and are otherwise negative except as noted above.  Physical Exam    VS:  BP 110/64 (BP Location: Left Arm, Patient Position: Sitting, Cuff Size: Large)   Pulse 66   Ht 5\' 8"  (1.727 m)   Wt 262 lb 8 oz (119.1 kg)   SpO2 97%   BMI 39.91 kg/m  , BMI Body  mass index is 39.91 kg/m. GEN: Well nourished, well developed, in no acute distress. HEENT: normal. Neck: Supple, no JVD, carotid bruits, or masses. Cardiac: RRR, no murmurs, rubs, or gallops. No clubbing, cyanosis, edema.  Radials/PT 2+ and equal bilaterally.  Respiratory:  Respirations regular and unlabored, clear to auscultation bilaterally. GI: Soft, nontender, nondistended. MS: No deformity or atrophy. Skin: Warm and dry, no rash. Neuro:  Strength and sensation are intact. Psych: Normal affect.  Assessment & Plan    1. Chronic systolic heart failure/NICM - Euvolemic on exam and well compensated. Plan for limited echo 3 months after most recent cardioversion 12/07/18 as much of reduced EF thought to be tachymediated. Echocardiogram ordered today. Present GDMT includes Bisoprolol, Digoxin, Losartan, Spironolactone, Torsemide. If EF remains <40%, plan to transition Losartan to Entresto (may be limited by low normal BP).  2. Persistent atrial fibrillation - Maintaining SR. Continue Amiodarone 200mg  daily.  3. On Amiodarone therapy - Secondary to PAF. Amiodarone monitoring: 08/07/18 TSH 2.126, 11/30/18 AST/ALT normal. Eye exam completed this year. Consider PFT for baseline study. Tells me he had a number of years ago but no record available for my review.   4. High risk medication use - Digoxin secondary to atrial fibrillation/HFrEF. Digoxin level 12/19/18 was 1.1. Continue digoxin 0.125 daily. No signs of toxicity.  5. Chronic anticoagulation - Secondary to atrial fibrillation. Eliquis 5mg  BID. No bleeding complications.   Disposition: Follow up in 2 month(s) with Dr. 12/02/18 or APP.  13/11/20, NP 03/05/2019, 3:26 PM

## 2019-03-08 ENCOUNTER — Other Ambulatory Visit: Payer: Self-pay | Admitting: Internal Medicine

## 2019-03-08 NOTE — Telephone Encounter (Signed)
°*  STAT* If patient is at the pharmacy, call can be transferred to refill team.   1. Which medications need to be refilled? (please list name of each medication and dose if known) potassium chloride 20 MEQ  2. Which pharmacy/location (including street and city if local pharmacy) is medication to be sent to? [facility] on [address]   3. Do they need a 30 day or 90 day supply? 90 day

## 2019-03-11 MED ORDER — POTASSIUM CHLORIDE CRYS ER 20 MEQ PO TBCR: 40.0000 meq | EXTENDED_RELEASE_TABLET | Freq: Every day | ORAL | 0 refills | Status: DC

## 2019-03-11 NOTE — Addendum Note (Signed)
Addended by: Thayer Headings, Ameliarose Shark L on: 03/11/2019 12:06 PM   Modules accepted: Orders

## 2019-03-11 NOTE — Telephone Encounter (Signed)
Requested Prescriptions   Signed Prescriptions Disp Refills   potassium chloride SA (KLOR-CON) 20 MEQ tablet 180 tablet 0    Sig: Take 2 tablets (40 mEq total) by mouth daily.    Authorizing Provider: Alver Sorrow    Ordering User: Thayer Headings, Maiyah Goyne L

## 2019-03-17 ENCOUNTER — Other Ambulatory Visit: Payer: Self-pay | Admitting: Physician Assistant

## 2019-03-26 ENCOUNTER — Other Ambulatory Visit: Payer: 59

## 2019-05-02 ENCOUNTER — Other Ambulatory Visit: Payer: 59

## 2019-05-07 ENCOUNTER — Telehealth: Payer: Self-pay | Admitting: Internal Medicine

## 2019-05-07 NOTE — Telephone Encounter (Signed)
-----   Message from [place].Jodelle Gross, RN sent at 05/07/2019  9:46 AM EDT ----- Yes, he will need to reschedule echo and postpone appt.   Thanks, Iva  ----- Message ----- From: Ethelle Lyon, CMA Sent: 05/07/2019   9:17 AM EDT To: Mickie Bail Burl Triage  Pt has a ECHO f/u appt on 05/08/19 Pt had a no show for scheduled ECHO 3/25 Does Pt need to keep appointment or reschedule? Thank you

## 2019-05-07 NOTE — Telephone Encounter (Signed)
Attempted to schedule.  LMOV to call office.  ° °

## 2019-05-08 ENCOUNTER — Ambulatory Visit: Payer: 59 | Admitting: Internal Medicine

## 2019-05-21 ENCOUNTER — Other Ambulatory Visit: Payer: Self-pay

## 2019-05-21 MED ORDER — TORSEMIDE 20 MG PO TABS: 40.0000 mg | ORAL_TABLET | Freq: Two times a day (BID) | ORAL | 0 refills | Status: DC

## 2019-05-27 ENCOUNTER — Other Ambulatory Visit: Payer: Self-pay

## 2019-05-27 ENCOUNTER — Other Ambulatory Visit: Payer: Self-pay | Admitting: *Deleted

## 2019-05-27 MED ORDER — SPIRONOLACTONE 25 MG PO TABS: 25.0000 mg | ORAL_TABLET | Freq: Every day | ORAL | 0 refills | Status: DC

## 2019-05-27 MED ORDER — AMIODARONE HCL 200 MG PO TABS: 200.0000 mg | ORAL_TABLET | Freq: Every day | ORAL | 0 refills | Status: DC

## 2019-05-27 MED ORDER — LOSARTAN POTASSIUM 25 MG PO TABS: 12.5000 mg | ORAL_TABLET | Freq: Every day | ORAL | 0 refills | Status: DC

## 2019-05-27 MED ORDER — DIGOXIN 125 MCG PO TABS: 0.1250 mg | ORAL_TABLET | Freq: Every day | ORAL | 0 refills | Status: DC

## 2019-06-07 ENCOUNTER — Other Ambulatory Visit: Payer: 59

## 2019-06-13 ENCOUNTER — Ambulatory Visit: Payer: 59 | Admitting: Internal Medicine

## 2019-07-15 ENCOUNTER — Other Ambulatory Visit: Payer: Self-pay | Admitting: Family

## 2019-07-16 ENCOUNTER — Other Ambulatory Visit: Payer: 59

## 2019-07-18 ENCOUNTER — Ambulatory Visit: Payer: 59 | Admitting: Nurse Practitioner

## 2019-08-08 ENCOUNTER — Ambulatory Visit (INDEPENDENT_AMBULATORY_CARE_PROVIDER_SITE_OTHER): Payer: 59

## 2019-08-08 ENCOUNTER — Other Ambulatory Visit: Payer: Self-pay

## 2019-08-08 DIAGNOSIS — I428 Other cardiomyopathies: Secondary | ICD-10-CM

## 2019-08-08 DIAGNOSIS — I5022 Chronic systolic (congestive) heart failure: Secondary | ICD-10-CM

## 2019-08-09 ENCOUNTER — Telehealth: Payer: Self-pay | Admitting: Family

## 2019-08-09 DIAGNOSIS — I5023 Acute on chronic systolic (congestive) heart failure: Secondary | ICD-10-CM

## 2019-08-09 NOTE — Telephone Encounter (Signed)
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  Discussed need for cardiac MRI and he is agreeable to proceed.  He reports no lightheadedness, dizziness, shortness of breath.  Does endorse that he does heavy weightlifting at the gym and he was encouraged to hold off on this activity until cardiac MRI completed.  He has upcoming follow-up with Ward Givens, NP next week.  He is interested in coming off some of his medications for heart failure as well as amiodarone.  Encouraged to discuss at follow-up.  We discussed that it is not likely that he would be able to come off of the Eliquis due to history of persistent atrial fibrillation and encouraged him to ask about patient assistance paperwork if needed.  Will route to triage team for assistance in scheduled cMRI.  Alver Sorrow, NP

## 2019-08-09 NOTE — Addendum Note (Signed)
Addended by: Efrain Sella on: 08/09/2019 02:34 PM   Modules accepted: Orders

## 2019-08-09 NOTE — Telephone Encounter (Signed)
Call to patient to make him aware that order has been placed as requested. Sent to precert.   Verbally reviewed instructions. Mailed as well.  Kenneth Ferguson,   Your provider has ordered a Cardiac MRI on ________. Someone will call you to schedule.   Please arrive at the [facility] main entrance of [facility] at _____ (30-45 minutes prior to test start time). ?  [facility]  [address]  [place], [place] [address]  [phone]  Proceed to the [facility] Radiology Department (First Floor).  ?  Magnetic resonance imaging (MRI) is a painless test that produces images of the inside of the body without using X-rays. During an MRI, strong magnets and radio waves work together in a Data processing manager to form detailed images. MRI images may provide more details about a medical condition than X-rays, CT scans, and ultrasounds can provide.  You may be given earphones to listen for instructions.  You may eat a light breakfast and take medications as ordered with the exception of Torsemide (fluid pill, other). If a contrast material will be used, an IV will be inserted into one of your veins. Contrast material will be injected into your IV.  You will be asked to remove all metal, including: Watch, jewelry, and other metal objects including hearing aids, hair pieces and dentures. (Braces and fillings normally are not a problem.)  If contrast material was used:  It will leave your body through your urine within a day. You may be told to drink plenty of fluids to help flush the contrast material out of your system.  TEST WILL TAKE APPROXIMATELY 1 HOUR  PLEASE NOTIFY SCHEDULING AT LEAST 24 HOURS IN ADVANCE IF YOU ARE UNABLE TO KEEP YOUR APPOINTMENT.    1- Your physician recommends that you return for lab work in: 1 week prior to MRI at the medical mall.  No appt is needed. Hours are M-F 7AM- 6 PM.

## 2019-08-15 ENCOUNTER — Encounter: Payer: Self-pay | Admitting: Nurse Practitioner

## 2019-08-15 ENCOUNTER — Other Ambulatory Visit: Payer: Self-pay

## 2019-08-15 ENCOUNTER — Ambulatory Visit (INDEPENDENT_AMBULATORY_CARE_PROVIDER_SITE_OTHER): Payer: 59 | Admitting: Nurse Practitioner

## 2019-08-15 VITALS — BP 120/74 | HR 84 | Ht 68.0 in | Wt 287.0 lb

## 2019-08-15 DIAGNOSIS — I5020 Unspecified systolic (congestive) heart failure: Secondary | ICD-10-CM | POA: Diagnosis not present

## 2019-08-15 DIAGNOSIS — I422 Other hypertrophic cardiomyopathy: Secondary | ICD-10-CM

## 2019-08-15 DIAGNOSIS — I4819 Other persistent atrial fibrillation: Secondary | ICD-10-CM | POA: Diagnosis not present

## 2019-08-15 DIAGNOSIS — Z79899 Other long term (current) drug therapy: Secondary | ICD-10-CM | POA: Diagnosis not present

## 2019-08-15 NOTE — Progress Notes (Signed)
Office Visit    Patient Name: Kenneth Ferguson Date of Encounter: 08/15/2019  Primary Care Provider:  Patient, [name] Primary Cardiologist:  Yvonne Kendall, MD  Chief Complaint    54 year old male with the above past medical history including paroxysmal atrial fibrillation, HFrEF, left atrial appendage thrombus, hypertrophic cardiomyopathy, morbid obesity, and acute stress reaction, who presents for f/u of HCM.  Past Medical History    Past Medical History:  Diagnosis Date  . HFrEF (heart failure with reduced ejection fraction) (HCC)    a. 08/2018 Echo: EF 40-45% in setting of Afib; b. 11/2018 Echo: EF 25-30% w/ sev ant, antsept, and apical HK in setting of Afib w/ RVR; b. 08/2019 Echo: EF 60-65%.  Marland Kitchen HOCM (hypertrophic obstructive cardiomyopathy) (HCC)    a. Prev dx in early 2000s?; b. 08/2019 Echo: EF 60-65%, sev asym septal hypertrophy w/ septal wall of 7mm. SAM. LVOT grad of @ rest, w/ valsalva. Gr2 DD. Nl RV fxn. Mod dil LA. Mild MR.  . Morbid obesity (HCC)   . Persistent atrial fibrillation (HCC)    a. 11/2018 s/p TEE/DCCV. Maintained on Amio/Eliquis (CHA2DS2VASc =1).  . 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 08/2018 TEE: LAA thrombus.   Past Surgical History:  Procedure Laterality Date  . CARDIOVERSION N/A 08/08/2018   Procedure: CARDIOVERSION;  Surgeon: Antonieta Iba, MD;  Location: [facility];  Service: Cardiovascular;  Laterality: N/A;  . CARDIOVERSION N/A 11/20/2018   Procedure: CARDIOVERSION;  Surgeon: Yvonne Kendall, MD;  Location: [facility];  Service: Cardiovascular;  Laterality: N/A;  . CARDIOVERSION N/A 11/20/2018   Procedure: CARDIOVERSION;  Surgeon: Yvonne Kendall, MD;  Location: [facility];  Service: Cardiovascular;  Laterality: N/A;  . CARDIOVERSION N/A 12/07/2018   Procedure: CARDIOVERSION;  Surgeon: Antonieta Iba, MD;  Location: [facility];  Service: Cardiovascular;  Laterality: N/A;  . TEE WITHOUT  CARDIOVERSION N/A 08/08/2018   Procedure: TRANSESOPHAGEAL ECHOCARDIOGRAM (TEE);  Surgeon: Antonieta Iba, MD;  Location: [facility];  Service: Cardiovascular;  Laterality: N/A;  . TEE WITHOUT CARDIOVERSION N/A 11/20/2018   Procedure: TRANSESOPHAGEAL ECHOCARDIOGRAM (TEE);  Surgeon: Yvonne Kendall, MD;  Location: [facility];  Service: Cardiovascular;  Laterality: N/A;    Allergies  No Known Allergies  History of Present Illness    54 year old male with the above past medical history including paroxysmal atrial fibrillation, HFrEF, left atrial venous thrombus, hypertrophic cardiomyopathy, morbid obesity, and acute stress reaction.  Cardiac history dates back to early 2000's, when he was living in [place].  He was reportedly found to have a murmur on routine physical and was referred to cardiology.  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  He did well until 2015, when he was diagnosed with atrial fibrillation.  Underwent successful TEE guided cardioversion at an outside hospital.  Apparently diagnostic catheterization performed sometime after when he was told he had normal coronary arteries.  In 2018, he was apparently admitted with recurrent atrial fibrillation and he was also diagnosed with hypertrophic cardiomyopathy ([facility]).  A LifeVest was placed, which he self discontinued and he was subsequently lost to follow-up.  In May 2020, he suffered the unexpected loss of his wife which resulted in acute stress reaction and recurrent atrial fibrillation.  He subsequently noted worsening dyspnea despite treatment for bronchitis followed by bilateral lower extremity edema.  He was again seen in outside hospital diagnosed with atrial fibrillation with rapid ventricular response and was  placed on beta-blocker.  He was seen by cardiology in [place] in June 2020 with recommendation for follow through with prior recommended work-up including echo, outpatient cardiac monitor,  and cardiac MRI.  In late June 2020, he presented to the [facility] with A. fib and RVR as well as acute systolic heart failure.  Echo EF of 40 to 45% with moderately increased LV wall thickness.  TEE was performed with a plan for cardioversion however, left atrial appendage thrombus was noted and decision was made to continue anticoagulation with plan for cardioversion at some point in the future.  He deferred scheduling for several months related to his work schedule and finally, in October, he presented with worsening heart failure symptoms and rapid A. fib.  He required admission to the ICU, inotropic support, and eventual initiation of amiodarone followed by TEE and cardioversion.  He was last seen in clinic in January of this year, at which time he was doing well.  Plan was for repeat echocardiogram to evaluate LV function in sinus rhythm.  He finally had this performed last week and this showed an EF of 60-65% with severe asymmetric septal hypertrophy (septal wall 20 mm), systolic anterior motion of mitral valve causing LVOT obstruction with posterior directed mitral regurgitation.  LVOT gradient was 17 mmHg at rest and increased to 49 mmHg with Valsalva.  He was contacted to discuss arranging cardiac MRI and he preferred to talk it over.  He notes that over the past 6 months, he has been doing exceptionally well.  He has been exercising regularly, both lifting weights and doing some aerobics.  He understands that he is not to strain in the setting of hypertrophic cardiomyopathy.  He is also begun racing cars again, which was something he enjoyed prior to his wife dying.  He feels that he is in a good place both physically and psychologically.  He denies chest pain, dyspnea, palpitations, PND, orthopnea, dizziness, syncope, or early satiety.  He does have mild lower extremity swelling, in the setting of being on his feet for much of the day.  He recognizes that he needs to lose weight and has been cutting back on  fast food and sugary drinks.  Home Medications    Prior to Admission medications   Medication Sig Start Date End Date Taking? Authorizing Provider  amiodarone (PACERONE) 200 MG tablet Take 1 tablet (200 mg total) by mouth daily. 05/27/19   End, Cristal Deer, MD  apixaban (ELIQUIS) 5 MG TABS tablet Take 1 tablet (5 mg total) by mouth 2 (two) times daily. 10/10/18   Creig Hines, NP  bisoprolol (ZEBETA) 5 MG tablet Take 0.5 tablets (2.5 mg total) by mouth daily. 03/19/19   End, Cristal Deer, MD  digoxin (LANOXIN) 0.125 MG tablet Take 1 tablet (0.125 mg total) by mouth daily. 05/27/19   End, Cristal Deer, MD  losartan (COZAAR) 25 MG tablet Take 0.5 tablets (12.5 mg total) by mouth daily. 05/27/19   End, Cristal Deer, MD  potassium chloride SA (KLOR-CON) 20 MEQ tablet TAKE 2 TABLETS(40 MEQ) BY MOUTH DAILY 07/15/19   Alver Sorrow, NP  spironolactone (ALDACTONE) 25 MG tablet Take 1 tablet (25 mg total) by mouth daily. 05/27/19   End, Cristal Deer, MD  torsemide (DEMADEX) 20 MG tablet Take 2 tablets (40 mg total) by mouth 2 (two) times daily. 05/21/19   End, Cristal Deer, MD    Review of Systems    Feels as though he is doing the best he has in over a year.  He does note mild lower extremity swelling at the end of long days but otherwise denies chest pain, dyspnea, palpitations, PND, orthopnea, dizziness, syncope, or early satiety.  All other systems reviewed and are otherwise negative except as noted above.  Physical Exam    VS:  BP 120/74 (BP Location: Left Arm, Patient Position: Sitting, Cuff Size: Large)   Pulse 84   Ht 5\' 8"  (1.727 m)   Wt 287 lb (130.2 kg)   SpO2 97%   BMI 43.64 kg/m  , BMI Body mass index is 43.64 kg/m. GEN: Well nourished, well developed, in no acute distress. HEENT: normal. Neck: Supple, no JVD, carotid bruits, or masses. Cardiac: RRR, 2/6 systolic murmur heard throughout, no rubs, or gallops. No clubbing, cyanosis, trace bilateral ankle edema.  Radials/PT 2+ and  equal bilaterally.  Respiratory:  Respirations regular and unlabored, clear to auscultation bilaterally. GI: Obese, soft, nontender, nondistended, BS + x 4. MS: no deformity or atrophy. Skin: warm and dry, no rash. Neuro:  Strength and sensation are intact. Psych: Normal affect.  Accessory Clinical Findings    ECG  -patient deferred  Echocardiogram reviewed with patient and updated in past medical history above.  Lab Results  Component Value Date   WBC 8.2 12/06/2018   HGB 15.4 12/06/2018   HCT 47.1 12/06/2018   MCV 91.5 12/06/2018   PLT 244 12/06/2018   Lab Results  Component Value Date   CREATININE 1.33 (H) 12/19/2018   BUN 16 12/19/2018   NA 136 12/19/2018   K 3.7 12/19/2018   CL 101 12/19/2018   CO2 26 12/19/2018   Lab Results  Component Value Date   ALT 24 11/30/2018   AST 33 11/30/2018   ALKPHOS 87 11/30/2018   BILITOT 2.4 (H) 11/30/2018    Lab Results  Component Value Date   HGBA1C 4.8 08/07/2018    Assessment & Plan    1.  Heart failure with improved ejection fraction: Patient with severe LV dysfunction October of last year in the setting of rapid atrial fibrillation, acute decompensation, and low output heart failure requiring admission and inotropic therapy.  He underwent TEE and cardioversion at that time and has been maintaining sinus rhythm since.  Follow-up echocardiogram last week shows an EF of 60 to 65%.  He is euvolemic on examination.  He has been doing well and has shown good exercise tolerance.  I did counsel him on the importance of avoiding straining and heavy weightlifting in the setting of LVH/hypertrophic cardiomyopathy.  He would like to come off of some of his medications if possible.  I advised that we may stop digoxin in the setting of maintenance of sinus rhythm and hypertrophic cardiomyopathy but otherwise would plan to continue his current regimen which includes bisoprolol, losartan, spironolactone, and torsemide.  2.  Hypertrophic  cardiomyopathy: As above, recent echo showed improved LV function with an EF of 60-65% with severe asymmetric septal hypertrophy, a 20 mm septum, an LVOT of 17 mmHg at rest and 49 mmHg with Valsalva.  Recommendation has been made for cardiac MRI and he is willing to pursue.  As above, discontinue digoxin in the setting of hypertrophic cardiomyopathy.  I have strongly advised against heavy weight lifting and straining.  Continue beta-blocker therapy.  3.  Persistent atrial fibrillation: Maintaining sinus rhythm since October 2020 status post TEE and cardioversion.  He remains on amiodarone 2 mg daily and Eliquis 5 mg twice daily.  He denies any palpitations.  He has not had any  labs since last November and we will arrange for follow-up complete metabolic panel, TSH, CBC, and PFTs (to establish baseline).  Ideally, we would like to see him off of amiodarone and perhaps we can work toward that goal in conjunction with his attempts at weight loss in the future.  4.  Morbid obesity: Patient has gained 46 pounds since last October.  He attributes this to weight lifting.  He is interested in losing weight and we discussed setting a goal of 1 pound weight loss per week over the next year as a reasonable goal.  5.  Disposition: Follow-up CBC, complete metabolic panel, TSH, PFTs, and cardiac MRI as outlined above.  Follow-up in clinic in 3 months or sooner if necessary.   Nicolasa Ducking, NP 08/15/2019, 4:38 PM

## 2019-08-15 NOTE — Patient Instructions (Signed)
.Medication Instructions:  - Your physician has recommended you make the following change in your medication:   1) STOP digoxin  *If you need a refill on your cardiac medications before your next appointment, please call your pharmacy*   Lab Work: - Your physician recommends that you return for lab work a week prior to your Cardiac MRI- CMET/ TSH/ CBC  Medical Mall entrance at [facility] Lab hours: Monday - Friday (7:30 am- 5:30 pm) Once you stop at the screening table inside the door of the Medical Mall, you will proceed to the 1st desk on the right, Registration, to check in  You will be directed down the hall way to the lab.   If you have labs (blood work) drawn today and your tests are completely normal, you will receive your results only by: Marland Kitchen MyChart Message (if you have MyChart) OR . A paper copy in the mail If you have any lab test that is abnormal or we need to change your treatment, we will call you to review the results.   Testing/Procedures: - Your physician has recommended that you have a pulmonary function test. Pulmonary Function Tests are a group of tests that measure how well air moves in and out of your lungs. We should be in touch with you tomorrow with a date/ time for this to be done as scheduling is currently closed.   - Your physician has requested that you have a cardiac MRI. Cardiac MRI uses a computer to create images of your heart as its beating, producing both still and moving pictures of your heart and major blood vessels. You will be called to schedule this test once your insurance gives an authorization.   You are scheduled for Cardiac MRI on _______________. Please arrive at the [facility] main entrance of [facility] at _______________ (30-45 minutes prior to test start time). ?  [facility]  [address]  [place], [place] [address]  [phone]  Proceed to the [facility] Radiology Department (First Floor).  ?  Magnetic resonance  imaging (MRI) is a painless test that produces images of the inside of the body without using X-rays. During an MRI, strong magnets and radio waves work together in a Data processing manager to form detailed images. MRI images may provide more details about a medical condition than X-rays, CT scans, and ultrasounds can provide.   - You may be given earphones to listen for instructions.  - You may eat a light breakfast and take medications as ordered with the exception of Torsemide (hold the morning of your test). - If a contrast material will be used, an IV will be inserted into one of your veins. - Contrast material will be injected into your IV.  - You will be asked to remove all metal, including: Watch, jewelry, and other metal objects including hearing aids, hair pieces and dentures. (Braces and fillings normally are not a problem.)   - If contrast material was used:  It will leave your body through your urine within a day. You may be told to drink plenty of fluids to help flush the contrast material out of your system.  TEST WILL TAKE APPROXIMATELY 1 HOUR  PLEASE NOTIFY SCHEDULING AT LEAST 24 HOURS IN ADVANCE IF YOU ARE UNABLE TO KEEP YOUR APPOINTMENT.      Follow-Up: At [facility], you and your health needs are our priority.  As part of our continuing mission to provide you with exceptional heart care, we  have created designated Provider Care Teams.  These Care Teams include your primary Cardiologist (physician) and Advanced Practice Providers (APPs -  Physician Assistants and Nurse Practitioners) who all work together to provide you with the care you need, when you need it.  We recommend signing up for the patient portal called "MyChart".  Sign up information is provided on this After Visit Summary.  MyChart is used to connect with patients for Virtual Visits (Telemedicine).  Patients are able to view lab/test results, encounter notes, upcoming appointments, etc.  Non-urgent messages can be sent  to your provider as well.   To learn more about what you can do with MyChart, go to [email or web address].    Your next appointment:   3 month(s)  The format for your next appointment:   In Person  Provider:   Yvonne Kendall, MD   Other Instructions

## 2019-08-23 ENCOUNTER — Telehealth: Payer: Self-pay | Admitting: *Deleted

## 2019-08-23 NOTE — Telephone Encounter (Signed)
-----   Message from Jefferey Pica, RN sent at 08/16/2019  5:18 PM EDT ----- Ward Givens saw this patient on 7/8 and it was after 4pm when he was done.  He needs PFT's on amio therapy.  Scheduling was closed when I tried to call on 7/8 and I didn't get to 7/9. Do you mind calling scheduling to see if they can call the patient directly to schedule.  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  They will need to talk to him to get a date if possible.  Thanks!

## 2019-08-23 NOTE — Telephone Encounter (Signed)
Barbara in Centralized Scheduling contacted and she is pleased to reach out to patient to schedule the PFTs.

## 2019-08-24 ENCOUNTER — Other Ambulatory Visit: Payer: Self-pay | Admitting: Internal Medicine

## 2019-08-26 ENCOUNTER — Other Ambulatory Visit: Payer: Self-pay | Admitting: Internal Medicine

## 2019-08-26 NOTE — Telephone Encounter (Signed)
Noted- will follow along to see if he has his PFT's scheduled.

## 2019-09-01 ENCOUNTER — Other Ambulatory Visit: Payer: Self-pay | Admitting: Internal Medicine

## 2019-09-25 ENCOUNTER — Encounter: Payer: Self-pay | Admitting: Family

## 2019-10-28 ENCOUNTER — Other Ambulatory Visit: Payer: Self-pay | Admitting: Internal Medicine

## 2019-10-28 NOTE — Telephone Encounter (Signed)
Refill Request.  

## 2019-10-28 NOTE — Telephone Encounter (Signed)
Pt's age 54, wt 130.2 kg, SCr 1.33, CrCl 115.57, last ov w/ CB 08/15/19.

## 2019-10-28 NOTE — Telephone Encounter (Signed)
Refill request

## 2019-11-01 ENCOUNTER — Other Ambulatory Visit: Payer: Self-pay | Admitting: Internal Medicine

## 2019-11-06 ENCOUNTER — Telehealth: Payer: Self-pay | Admitting: Internal Medicine

## 2019-11-06 NOTE — Telephone Encounter (Signed)
Patient calling in to clarify some discussions that were involved in his last office visit. One example was his weight gain and starting a synthroid. Patient would also to talk about the cardiac MRI coming up with Dr. Okey Dupre

## 2019-11-07 NOTE — Telephone Encounter (Signed)
Spoke to patient. He would like to know about plan of care a little more. He still needs lab work to check TSH at [facility]. He is aware to get these prior to upcoming appointment with Dr End on 11/18/19. Patient wants to see if his thyroid is causing him to have weight gain. He also is not sure he wants the Cardiac MRI and wants to discuss with Dr Park Breed. He has appointment scheduled and it agreeable to discuss his concerns at that time.

## 2019-11-08 ENCOUNTER — Ambulatory Visit (HOSPITAL_COMMUNITY): Payer: 59

## 2019-11-18 ENCOUNTER — Ambulatory Visit (INDEPENDENT_AMBULATORY_CARE_PROVIDER_SITE_OTHER): Payer: 59 | Admitting: Internal Medicine

## 2019-11-18 ENCOUNTER — Other Ambulatory Visit
Admission: RE | Admit: 2019-11-18 | Discharge: 2019-11-18 | Disposition: A | Discharge status: Home / Self Care | Payer: 59 | Attending: Nurse Practitioner | Admitting: Nurse Practitioner

## 2019-11-18 ENCOUNTER — Other Ambulatory Visit: Payer: Self-pay

## 2019-11-18 ENCOUNTER — Encounter: Payer: Self-pay | Admitting: Internal Medicine

## 2019-11-18 VITALS — BP 124/78 | HR 70 | Ht 68.0 in | Wt 294.2 lb

## 2019-11-18 DIAGNOSIS — I4819 Other persistent atrial fibrillation: Secondary | ICD-10-CM | POA: Diagnosis present

## 2019-11-18 DIAGNOSIS — I422 Other hypertrophic cardiomyopathy: Secondary | ICD-10-CM | POA: Diagnosis not present

## 2019-11-18 DIAGNOSIS — I10 Essential (primary) hypertension: Secondary | ICD-10-CM | POA: Diagnosis not present

## 2019-11-18 DIAGNOSIS — I5020 Unspecified systolic (congestive) heart failure: Secondary | ICD-10-CM

## 2019-11-18 DIAGNOSIS — Z79899 Other long term (current) drug therapy: Secondary | ICD-10-CM

## 2019-11-18 LAB — COMPREHENSIVE METABOLIC PANEL
ALT: 22 U/L (ref 0–44)
AST: 25 U/L (ref 15–41)
Albumin: 4.1 g/dL (ref 3.5–5.0)
Alkaline Phosphatase: 76 U/L (ref 38–126)
Anion gap: 8 (ref 5–15)
BUN: 21 mg/dL — ABNORMAL HIGH (ref 6–20)
CO2: 26 mmol/L (ref 22–32)
Calcium: 8.8 mg/dL — ABNORMAL LOW (ref 8.9–10.3)
Chloride: 102 mmol/L (ref 98–111)
Creatinine, Ser: 1.45 mg/dL — ABNORMAL HIGH (ref 0.61–1.24)
GFR, Estimated: 54 mL/min — ABNORMAL LOW (ref >60)
Glucose, Bld: 104 mg/dL — ABNORMAL HIGH (ref 70–99)
Potassium: 3.5 mmol/L (ref 3.5–5.1)
Sodium: 136 mmol/L (ref 135–145)
Total Bilirubin: 0.8 mg/dL (ref 0.3–1.2)
Total Protein: 7.3 g/dL (ref 6.5–8.1)

## 2019-11-18 LAB — CBC WITH DIFFERENTIAL/PLATELET
Abs Immature Granulocytes: 0.02 10*3/uL (ref 0.00–0.07)
Basophils Absolute: 0 10*3/uL (ref 0.0–0.1)
Basophils Relative: 1 %
Eosinophils Absolute: 0.1 10*3/uL (ref 0.0–0.5)
Eosinophils Relative: 2 %
HCT: 38.2 % — ABNORMAL LOW (ref 39.0–52.0)
Hemoglobin: 13.1 g/dL (ref 13.0–17.0)
Immature Granulocytes: 0 %
Lymphocytes Relative: 27 %
Lymphs Abs: 1.5 10*3/uL (ref 0.7–4.0)
MCH: 31.1 pg (ref 26.0–34.0)
MCHC: 34.3 g/dL (ref 30.0–36.0)
MCV: 90.7 fL (ref 80.0–100.0)
Monocytes Absolute: 0.7 10*3/uL (ref 0.1–1.0)
Monocytes Relative: 12 %
Neutro Abs: 3.4 10*3/uL (ref 1.7–7.7)
Neutrophils Relative %: 58 %
Platelets: 219 10*3/uL (ref 150–400)
RBC: 4.21 MIL/uL — ABNORMAL LOW (ref 4.22–5.81)
RDW: 13.8 % (ref 11.5–15.5)
WBC: 5.7 10*3/uL (ref 4.0–10.5)
nRBC: 0 % (ref 0.0–0.2)

## 2019-11-18 LAB — TSH: TSH: 2.208 u[IU]/mL (ref 0.350–4.500)

## 2019-11-18 NOTE — Patient Instructions (Signed)
Medication Instructions:  Your physician recommends that you continue on your current medications as directed. Please refer to the Current Medication list given to you today.  *If you need a refill on your cardiac medications before your next appointment, please call your pharmacy*   Lab Work: Your physician recommends that you return for lab work in: TODAY as you leave at the [facility].  - Please go to the [facility]. You will check in at the front desk to the right as you walk into the atrium. Valet Parking is offered if needed. - No appointment needed. You may go any day between 7 am and 6 pm.  If you have labs (blood work) drawn today and your tests are completely normal, you will receive your results only by: Marland Kitchen MyChart Message (if you have MyChart) OR . A paper copy in the mail If you have any lab test that is abnormal or we need to change your treatment, we will call you to review the results.   Testing/Procedures: No new.    Follow-Up: You have been referred to Electrophysiologist - Dr Lalla Brothers for evaluation. Someone will call to schedule.     At [facility], you and your health needs are our priority.  As part of our continuing mission to provide you with exceptional heart care, we have created designated Provider Care Teams.  These Care Teams include your primary Cardiologist (physician) and Advanced Practice Providers (APPs -  Physician Assistants and Nurse Practitioners) who all work together to provide you with the care you need, when you need it.  We recommend signing up for the patient portal called "MyChart".  Sign up information is provided on this After Visit Summary.  MyChart is used to connect with patients for Virtual Visits (Telemedicine).  Patients are able to view lab/test results, encounter notes, upcoming appointments, etc.  Non-urgent messages can be sent to your provider as well.   To learn more about what you can do with MyChart, go to  [email or web address].    Your next appointment:   3 month(s)  The format for your next appointment:   In Person  Provider:   You may see Yvonne Kendall, MD or one of the following Advanced Practice Providers on your designated Care Team:    Nicolasa Ducking, NP  Eula Listen, PA-C  Marisue Ivan, PA-C  Cadence Sheridan, [place]

## 2019-11-18 NOTE — Progress Notes (Signed)
Follow-up Outpatient Visit Date: 11/18/2019  Primary Care Provider: Patient, [name] No address on file  Chief Complaint: Follow-up atrial fibrillation and cardiomyopathy  HPI:  Kenneth Ferguson is a 54 y.o. male with history of chronic HFrEF with normalization of LVEF, persistent atrial fibrillation complicated by left atrial appendage thrombus, and hypertrophic cardiomyopathy, who presents for follow-up of heart failure and atrial fibrillation.  He was last seen in our office in 08/2019 by Ward Givens, NP.  Echo performed shortly before this visit was notable for normalization of LVEF but findings consistent with hypertrophic cardiomyopathy (severe asymmetric septal hypertrophy and systolic anterior motion of the mitral valve with dynamic LVOT gradient and associated mitral regurgitation).  Digoxin was discontinued.  Kenneth Ferguson was also referred for cardiac MRI that is scheduled for later this month.  Today, Kenneth Ferguson states that he is feeling better than he has in a long time.  He denies chest pain, shortness of breath, palpitations, lightheadedness, and edema.  He is exercising regularly without any difficulty.  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  He is tolerating his medications well.  He attributes his 30 pound weight gain over the last 9 months to exercise.  He reports that he is trying to follow a healthier diet.  --------------------------------------------------------------------------------------------------  Past Medical History:  Diagnosis Date  . HFrEF (heart failure with reduced ejection fraction) (HCC)    a. 08/2018 Echo: EF 40-45% in setting of Afib; b. 11/2018 Echo: EF 25-30% w/ sev ant, antsept, and apical HK in setting of Afib w/ RVR; b. 08/2019 Echo: EF 60-65%.  Marland Kitchen HOCM (hypertrophic obstructive cardiomyopathy) (HCC)    a. Prev dx in early 2000s?; b. 08/2019 Echo: EF 60-65%, sev asym septal hypertrophy w/ septal wall of 22mm. SAM. LVOT grad of  @ rest, w/ valsalva. Gr2 DD. Nl RV fxn. Mod dil LA. Mild MR.  . Morbid obesity (HCC)   . Persistent atrial fibrillation (HCC)    a. 11/2018 s/p TEE/DCCV. Maintained on Amio/Eliquis (CHA2DS2VASc =1).  . 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 08/2018 TEE: LAA thrombus.   Past Surgical History:  Procedure Laterality Date  . CARDIOVERSION N/A 08/08/2018   Procedure: CARDIOVERSION;  Surgeon: Antonieta Iba, MD;  Location: [facility];  Service: Cardiovascular;  Laterality: N/A;  . CARDIOVERSION N/A 11/20/2018   Procedure: CARDIOVERSION;  Surgeon: Yvonne Kendall, MD;  Location: [facility];  Service: Cardiovascular;  Laterality: N/A;  . CARDIOVERSION N/A 11/20/2018   Procedure: CARDIOVERSION;  Surgeon: Yvonne Kendall, MD;  Location: [facility];  Service: Cardiovascular;  Laterality: N/A;  . CARDIOVERSION N/A 12/07/2018   Procedure: CARDIOVERSION;  Surgeon: Antonieta Iba, MD;  Location: [facility];  Service: Cardiovascular;  Laterality: N/A;  . TEE WITHOUT CARDIOVERSION N/A 08/08/2018   Procedure: TRANSESOPHAGEAL ECHOCARDIOGRAM (TEE);  Surgeon: Antonieta Iba, MD;  Location: [facility];  Service: Cardiovascular;  Laterality: N/A;  . TEE WITHOUT CARDIOVERSION N/A 11/20/2018   Procedure: TRANSESOPHAGEAL ECHOCARDIOGRAM (TEE);  Surgeon: Yvonne Kendall, MD;  Location: [facility];  Service: Cardiovascular;  Laterality: N/A;    Current Meds  Medication Sig  . amiodarone (PACERONE) 200 MG tablet TAKE 1 TABLET(200 MG) BY MOUTH DAILY  . bisoprolol (ZEBETA) 5 MG tablet Take 0.5 tablets (2.5 mg total) by mouth daily.  Marland Kitchen ELIQUIS 5 MG TABS tablet TAKE 1 TABLET(5 MG) BY MOUTH TWICE DAILY  . losartan (COZAAR) 25 MG tablet TAKE 1/2 TABLET(12.5  MG) BY MOUTH DAILY  . potassium chloride SA (KLOR-CON) 20 MEQ tablet TAKE 2 TABLETS(40 MEQ) BY MOUTH DAILY  . spironolactone (ALDACTONE) 25 MG tablet TAKE 1 TABLET(25 MG) BY MOUTH DAILY  . torsemide (DEMADEX) 20 MG  tablet TAKE 2 TABLETS(40 MG) BY MOUTH TWICE DAILY    Allergies: Patient has no known allergies.  Social History   Tobacco Use  . Smoking status: Never Smoker  . Smokeless tobacco: Never Used  Vaping Use  . Vaping Use: Never used  Substance Use Topics  . Alcohol use: Never  . Drug use: Never    Family History  Problem Relation Age of Onset  . Emphysema Mother   . Cancer Father     Review of Systems: A 12-system review of systems was performed and was negative except as noted in the HPI.  --------------------------------------------------------------------------------------------------  Physical Exam: BP 124/78   Pulse 70   Ht 5\' 8"  (1.727 m)   Wt 294 lb 3.2 oz (133.4 kg)   BMI 44.73 kg/m   General:  NAD. Neck: No JVD or HJR, though body habitus limits evaluation. Lungs: Clear to auscultation bilaterally without wheezes or crackles. Heart: Regular rate and rhythm without murmurs, rubs, and gallops. Abdomen: Obese but soft.  Non-tender/non-distended. Extremities: Trace pretibial edema.  EKG:  NSR with incomplete LBBB and borderline prolonged QT.  Compared with prior tracing from 03/05/2019, QRS is slightly wider.  Axis has also shiftd to the left.  Lab Results  Component Value Date   WBC 8.2 12/06/2018   HGB 15.4 12/06/2018   HCT 47.1 12/06/2018   MCV 91.5 12/06/2018   PLT 244 12/06/2018    Lab Results  Component Value Date   NA 136 12/19/2018   K 3.7 12/19/2018   CL 101 12/19/2018   CO2 26 12/19/2018   BUN 16 12/19/2018   CREATININE 1.33 (H) 12/19/2018   GLUCOSE 111 (H) 12/19/2018   ALT 24 11/30/2018    No results found for: CHOL, HDL, LDLCALC, LDLDIRECT, TRIG, CHOLHDL  --------------------------------------------------------------------------------------------------  ASSESSMENT AND PLAN: Chronic HFrEF with recovered LVEF: Kenneth Ferguson appears euvolemic and reports NYHA class I HF symptoms.  We will continue his current evidence-based HF regimen  consistent of bisoprolol, losartan, and spironolactone.  We will check a CMP today, as previously ordered.  We will also continue torsemide 20 mg twice daily, as this seems to be controlling his edema well.  Cardiomyopathy is thought to be non-ischemic (tachycardia-mediated).  We will defer ischemia evaluation for now given normalization of LVEF with restoration of sinus rhythm and medical therapy.  Hypertrophic cardiomyopathy: No symptoms reported that recent echo showed dynamic LVOT gradient with SAM and associated mitral regurgitation.  I encouraged Kenneth Ferguson to move forward with cardiac MRI for further LV assessment, including possible abnormal enhancement that may place him at increased risk for sudden cardiac death.  Persistent atrial fibrillation: Kenneth Ferguson is maintaining sinus rhythm on amiodarone.  He is also tolerating apixaban 5 mg twice daily well, which we plan to continue indefinitely in the setting of HCM.  I am concerned about the prospect of keeping him on amiodarone indefinitely, though it should be noted that he reverted back to atrial fibrillation in <2 weeks following his first cardioversion last year.  I will refer him to EP for discussion of further rhythm control options.  I will check a CBC and TSH today; in particular, Kenneth Ferguson is concerned that he may have hypothyroidism given his weight gain over the last 9  months.  Hypertension: Blood pressure is normal today.  We will defer medication changes at this time.  Morbid obesity: Weight loss encouraged through diet and continued exercise.  Follow-up: Return to clinic in 3 months.  Yvonne Kendall, MD 11/18/2019 4:49 PM

## 2019-11-26 ENCOUNTER — Telehealth (HOSPITAL_COMMUNITY): Payer: Self-pay | Admitting: Emergency Medicine

## 2019-11-26 NOTE — Telephone Encounter (Signed)
Attempted to call patient regarding upcoming cardiac MR appointment. Left message on voicemail with name and callback number Isiac Breighner RN Navigator Cardiac Imaging [facility] [phone] Office [phone] Cell  

## 2019-11-27 ENCOUNTER — Ambulatory Visit (HOSPITAL_COMMUNITY): Admission: RE | Admit: 2019-11-27 | Payer: 59 | Source: Ambulatory Visit

## 2019-11-28 ENCOUNTER — Institutional Professional Consult (permissible substitution): Payer: 59 | Admitting: Cardiology

## 2019-11-29 ENCOUNTER — Encounter: Payer: Self-pay | Admitting: Cardiology

## 2019-12-05 ENCOUNTER — Telehealth: Payer: Self-pay | Admitting: Internal Medicine

## 2019-12-05 NOTE — Telephone Encounter (Signed)
Left message for patient to call and discuss rescheduling the Cardiac MRI appointment that was missed on 11/27/19

## 2019-12-06 NOTE — Telephone Encounter (Signed)
Spoke with patient regarding the rescheduling of the Cardiac MRI that was ordered by Dr. Okey Dupre.  Patient states he is meeting with his employer on Tuesday 12/10/19 about a shift change and will call me back.

## 2019-12-09 ENCOUNTER — Other Ambulatory Visit: Payer: Self-pay | Admitting: Internal Medicine

## 2019-12-09 NOTE — Telephone Encounter (Signed)
Rx request sent to pharmacy.  

## 2019-12-17 NOTE — Telephone Encounter (Signed)
Left message for patient to call and discuss rescheduling the Cardiac MRI ordered by Dr. Okey Dupre

## 2019-12-19 NOTE — Telephone Encounter (Signed)
Lm for patient to call and discuss rescheduling the Cardiac MRI ordered by Dr. Okey Dupre

## 2019-12-23 NOTE — Telephone Encounter (Signed)
Left message for patient to call and discuss scheduling the Cardiac MRI ordered by Hubbard Hartshorn, NP

## 2019-12-25 ENCOUNTER — Encounter: Payer: Self-pay | Admitting: Family

## 2019-12-25 NOTE — Telephone Encounter (Signed)
Patient was to call 12/10/19 to discuss rescheduling his Cardiac MRI---will mail letter requesting he call to discuss.

## 2019-12-31 ENCOUNTER — Other Ambulatory Visit: Payer: Self-pay | Admitting: Internal Medicine

## 2019-12-31 MED ORDER — SPIRONOLACTONE 25 MG PO TABS: ORAL_TABLET | ORAL | 0 refills | Status: DC

## 2019-12-31 MED ORDER — TORSEMIDE 20 MG PO TABS
40.0000 mg | ORAL_TABLET | Freq: Two times a day (BID) | ORAL | 0 refills | Status: DC
Start: 2019-12-31 — End: 2020-06-29

## 2019-12-31 MED ORDER — LOSARTAN POTASSIUM 25 MG PO TABS: ORAL_TABLET | ORAL | 0 refills | Status: DC

## 2019-12-31 MED ORDER — AMIODARONE HCL 200 MG PO TABS
200.0000 mg | ORAL_TABLET | Freq: Every day | ORAL | 0 refills | Status: DC
Start: 2019-12-31 — End: 2020-06-29

## 2019-12-31 NOTE — Telephone Encounter (Signed)
Requested Prescriptions   Signed Prescriptions Disp Refills   amiodarone (PACERONE) 200 MG tablet 90 tablet 0    Sig: Take 1 tablet (200 mg total) by mouth daily.    Authorizing Provider: END, CHRISTOPHER    Ordering User: STOVER, Myna Hidalgo   torsemide (DEMADEX) 20 MG tablet 360 tablet 0    Sig: Take 2 tablets (40 mg total) by mouth 2 (two) times daily.    Authorizing Provider: END, CHRISTOPHER    Ordering User: STOVER, Myna Hidalgo   losartan (COZAAR) 25 MG tablet 45 tablet 0    Sig: TAKE 1/2 TABLET(12.5 MG) BY MOUTH DAILY    Authorizing Provider: END, CHRISTOPHER    Ordering User: Yenty Bloch C   spironolactone (ALDACTONE) 25 MG tablet 90 tablet 0    Sig: TAKE 1 TABLET(25 MG) BY MOUTH DAILY    Authorizing Provider: END, CHRISTOPHER    Ordering User: Kendrick Fries

## 2019-12-31 NOTE — Telephone Encounter (Signed)
*  STAT* If patient is at the pharmacy, call can be transferred to refill team.   1. Which medications need to be refilled? (please list name of each medication and dose if known)    Amiodarone 200 mg po q d  Torsemide 40 mg po BID   2. Which pharmacy/location (including street and city if local pharmacy) is medication to be sent to?   [facility] [address]   3. Do they need a 30 day or 90 day supply? 90

## 2019-12-31 NOTE — Telephone Encounter (Signed)
Patient calling, States he is still getting calls from [facility] saying that we have denied his amiodarone and torsemide but it appears it was sent and confirmed, patient will be checking with [facility].  States that he also wants to see if he can get these other 2 medications.  Please call with any concerns.   *STAT* If patient is at the pharmacy, call can be transferred to refill team.   1. Which medications need to be refilled? (please list name of each medication and dose if known)  Losartan 25 MG Spironolactone 25 MG  2. Which pharmacy/location (including street and city if local pharmacy) is medication to be sent to? [facility] [address] in [place]  3. Do they need a 30 day or 90 day supply? 90 day

## 2019-12-31 NOTE — Addendum Note (Signed)
Addended by: Kendrick Fries on: 12/31/2019 04:13 PM   Modules accepted: Orders

## 2019-12-31 NOTE — Telephone Encounter (Signed)
Rx request sent to pharmacy.  

## 2020-01-08 ENCOUNTER — Institutional Professional Consult (permissible substitution): Payer: 59 | Admitting: Cardiology

## 2020-02-10 ENCOUNTER — Telehealth: Payer: Self-pay

## 2020-02-10 NOTE — Telephone Encounter (Signed)
Prior Authorization completed in [email or web address] for Eliquis as requested. KEY: [id number]  RESPONSE: Your information has been submitted to [employer]. To check for an updated outcome later, reopen this PA request from your dashboard.  If [employer] has not responded to your request within 24 hours, contact [employer] at [phone]. If you think there may be a problem with your PA request, use our live chat feature at the bottom right.

## 2020-02-12 ENCOUNTER — Institutional Professional Consult (permissible substitution): Payer: 59 | Admitting: Cardiology

## 2020-02-14 ENCOUNTER — Other Ambulatory Visit: Payer: Self-pay

## 2020-02-14 ENCOUNTER — Telehealth: Payer: Self-pay | Admitting: Internal Medicine

## 2020-02-14 ENCOUNTER — Telehealth: Payer: Self-pay

## 2020-02-14 MED ORDER — APIXABAN 5 MG PO TABS: ORAL_TABLET | ORAL | 1 refills | Status: DC

## 2020-02-14 NOTE — Telephone Encounter (Signed)
Prior Authorization Kenneth Ferguson Key: [id number] - Rx #: [id number] Need help? Call us at [phone] Status Sent to Plan today Drug Eliquis 5MG  tablets Form [employer] Electronic PA Form (2017 NCPDP) Original Claim Info 70,MR  Patient has been notified the prior authorization has been initiated and is awaiting approval.  Told the patient to contact his pharmacy over the weekend to get an emergency refill and or to check to see if the prior authorization approved since our office is closed over the weekend.

## 2020-02-14 NOTE — Telephone Encounter (Signed)
Per son pharmacy will not dispense needs prior auth and patient will be out of medication sunday.

## 2020-02-14 NOTE — Telephone Encounter (Signed)
Expedited PA sent to covermymeds.  Key: Lewie Loron

## 2020-02-14 NOTE — Telephone Encounter (Signed)
Refill request

## 2020-02-14 NOTE — Telephone Encounter (Signed)
*  STAT* If patient is at the pharmacy, call can be transferred to refill team.   1. Which medications need to be refilled? (please list name of each medication and dose if known) eliquis 5 mg bid  2. Which pharmacy/location (including street and city if local pharmacy) is medication to be sent to? [facility] [address]  3. Do they need a 30 day or 90 day supply? 30  Scheduled for 1/26 w/ End

## 2020-02-14 NOTE — Telephone Encounter (Signed)
Please see note below. 

## 2020-02-14 NOTE — Telephone Encounter (Signed)
Eliquis 5mg  refill request received. Patient is 55 years old, weight-133.4kg, Crea-1.45 on 11/18/2019, Diagnosis-Afib, and last seen by Dr. 01/18/2020 on 11/18/2019. Dose is appropriate based on dosing criteria. Will send in refill to requested pharmacy.

## 2020-02-17 NOTE — Telephone Encounter (Signed)
Notified patient about the process. He was appreciative. States he has a new insurance that is no Community education officer anymore. We do not have that one on file. He will upload info to MyChart or come by tomorrow to update the insurance.  Routing to [place] to see if she can fax the denial letter to pharmacist.

## 2020-02-17 NOTE — Telephone Encounter (Signed)
Yes we will work on. Please let patient know that its been denied, but we are working on the appeals. Please also fax over the denial letter to [phone]. thanks

## 2020-02-17 NOTE — Telephone Encounter (Signed)
Eliquis denial faxed to number provided.  The denial is from [place] and it looks like per Christie Beckers, RN note he has new insurance.

## 2020-02-17 NOTE — Telephone Encounter (Signed)
This request has received a denial. View the bottom of the request for an electronic copy of the denial letter with a list of denial reasons from the plan.  After reading the electronic denial letter, you may choose to complete an appeal. View the bottom of the request to see if an eAppeal is available.   "Based on the policy and the information we received your request was denied. The request was denied because the requested medication can be switched to the formulary alternative. Formulary alternatives are: warfarin, Xarelto. Requirement: 3 in a class with 3 or more alternatives, 2 in a class with 2 alternatives, or 1 in a class 1 alternative. Note: Formulary alternatives may require a prior authorization. Your prescriber will be responsible for determining what alternative is appropriate for you"

## 2020-02-17 NOTE — Telephone Encounter (Addendum)
Received another prior auth form for Eliquis, this has been submitted to pt's insurance (request came from [facility]). Can submit an appeals if we receive denial letter from previous submission.  If new insurance info is submitted, will plan to submit another authorization as well.

## 2020-02-17 NOTE — Telephone Encounter (Signed)
This request has received a denial. View the bottom of the request for an electronic copy of the denial letter with a list of denial reasons from the plan.  After reading the electronic denial letter, you may choose to complete an appeal. View the bottom of the request to see if an eAppeal is available.   "Based on the policy and the information we received your request was denied. The request was denied because the requested medication can be switched to the formulary alternative. Formulary alternatives are: warfarin, Xarelto. Requirement: 3 in a class with 3 or more alternatives, 2 in a class with 2 alternatives, or 1 in a class 1 alternative. Note: Formulary alternatives may require a prior authorization. Your prescriber will be responsible for determining what alternative is appropriate for you" 

## 2020-02-18 NOTE — Telephone Encounter (Signed)
Additional PA and appeal form sent on 02/17/19

## 2020-02-19 NOTE — Telephone Encounter (Signed)
Appeals has also been sent to insurance that sent [place] a denial. No new insurance info scanned into Epic yet.

## 2020-03-04 ENCOUNTER — Institutional Professional Consult (permissible substitution): Payer: 59 | Admitting: Cardiology

## 2020-03-05 ENCOUNTER — Telehealth: Payer: Self-pay | Admitting: *Deleted

## 2020-03-05 ENCOUNTER — Encounter: Payer: Self-pay | Admitting: Cardiology

## 2020-03-05 ENCOUNTER — Other Ambulatory Visit: Payer: Self-pay | Admitting: Internal Medicine

## 2020-03-05 NOTE — Telephone Encounter (Signed)
referral Received: Today Tamera Reason, RN Patient no show or cancel x 4 for EP referral . Attempted to reach out today but had to lm. Closing referral . Patient is scheduled next week with CE.   Thanks   [employer]

## 2020-03-09 ENCOUNTER — Telehealth: Payer: Self-pay | Admitting: Pharmacist

## 2020-03-09 NOTE — Telephone Encounter (Signed)
Called pt and left message advising him that we are still waiting to hear back from his insurance regarding Eliquis coverage. His copay card should continue to work if he needs a refill in the mean time.

## 2020-03-11 ENCOUNTER — Ambulatory Visit: Payer: 59 | Admitting: Internal Medicine

## 2020-03-13 ENCOUNTER — Ambulatory Visit (INDEPENDENT_AMBULATORY_CARE_PROVIDER_SITE_OTHER): Payer: Self-pay | Admitting: Cardiology

## 2020-03-13 DIAGNOSIS — I422 Other hypertrophic cardiomyopathy: Secondary | ICD-10-CM

## 2020-03-13 DIAGNOSIS — I5020 Unspecified systolic (congestive) heart failure: Secondary | ICD-10-CM

## 2020-03-13 DIAGNOSIS — I4819 Other persistent atrial fibrillation: Secondary | ICD-10-CM

## 2020-03-13 DIAGNOSIS — Z79899 Other long term (current) drug therapy: Secondary | ICD-10-CM

## 2020-03-13 NOTE — Progress Notes (Signed)
Na

## 2020-03-16 NOTE — Telephone Encounter (Signed)
Received a letter from [employer] stating that our appeals was denied because patient does not have active coverage. Called patient to try to figure out who he has coverage through. No answer. Left VM for pt to call back

## 2020-03-17 NOTE — Telephone Encounter (Signed)
Spoke to patient who states that he gave his new insurance info to [employer] and they were able to fill Eliquis. Called pharmacy and confirmed that they had filled his Eliquis through new insurance.

## 2020-03-25 NOTE — Progress Notes (Deleted)
Cardiology Office Note    Date:  03/25/2020   ID:  Kenneth Ferguson, DOB 1966/01/06, MRN [id number]  PCP:  Patient, [name]  Cardiologist:  Yvonne Kendall, MD  Electrophysiologist:  Lanier Prude, MD   Chief Complaint: ***  History of Present Illness:   Kenneth Ferguson is a 55 y.o. 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  His cardiac history dates to the early 2000's while living in [place].  At that time he was found to have a murmur on a routine physical and referred to cardiology.  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 fib.  He underwent successful TEE guided DCCV at an outside hospital.  Notes indicate diagnostic cath was performed sometime after when he was told he had normal coronary arteries.  In 2018 he was admitted with recurrent A. fib and was diagnosed with hypertrophic cardiomyopathy.  LifeVest was placed which he self discontinued and was subsequently lost to follow-up.  In 06/2018 he suffered the unexpected loss of his wife which resulted in acute stress reaction and recurrent A. fib.  He subsequently noted worsening dyspnea despite treatment for bronchitis followed by worsening bilateral lower extremity edema.  He was seen in an outside hospital and diagnosed with A. fib with RVR and placed on beta-blocker.  He was seen by cardiology in [place], [place], and 07/2018 with recommendation to follow through with prior recommended work-up including echo, outpatient cardiac monitoring, and cardiac MRI.  In late 07/2018 presented to [facility] with A. fib with RVR as well as acute HFrEF.  Echo showed an EF of 40 to 45% with moderately increased LV wall thickness.  TEE was performed with plan for DCCV however, a left atrial appendage  thrombus was noted and the decision was made to continue anticoagulation with plan for DCCV down the road.  He deferred scheduling this for several months related to his work schedule and in 11/2018 he presented with worsening heart failure in the context of A. fib with RVR.  He required ICU admission with inotropic support and eventual initiation of amiodarone followed by TEE guided DCCV.  Repeat echo in 07/2019 showed an improved LV systolic function with an EF of 60 to 65% with severe asymmetric septal hypertrophy with a septal wall of 20 mm, systolic anterior motion of the mitral valve causing LVOT obstruction with posterior directed mitral regurgitation, LVOT gradient was 17 mmHg at rest increasing to 49 mmHg with Valsalva.  Cardiac MRI was recommended though deferred.  He was seen by his primary cardiologist in 11/2019 and was feeling better than he had in a long time.  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  He was noted to have an approximate 30 pound weight gain over the preceding 9 months which he attributed to exercise.  He was referred to EP for further discussion regarding rhythm control options.  He canceled this appointment.  ***   Labs independently reviewed: 11/2019 - potassium 3.5, BUN 21, serum creatinine 1.45, albumin 4.1, AST/ALT normal, TSH normal, Hgb 13.1, PLT 219 11/2018 - magnesium 2.1 07/2018 - A1c 4.8  Past Medical History:  Diagnosis Date  . HFrEF (heart failure with reduced ejection fraction) (HCC)    a.  08/2018 Echo: EF 40-45% in setting of Afib; b. 11/2018 Echo: EF 25-30% w/ sev ant, antsept, and apical HK in setting of Afib w/ RVR; b. 08/2019 Echo: EF 60-65%.  Kenneth Ferguson HOCM (hypertrophic obstructive cardiomyopathy) (HCC)    a. Prev dx in early 2000s?; b. 08/2019 Echo: EF 60-65%, sev asym septal hypertrophy w/ septal wall of 23mm. SAM. LVOT grad of @ rest, w/ valsalva. Gr2 DD. Nl RV fxn. Mod dil LA. Mild  MR.  . Morbid obesity (HCC)   . Persistent atrial fibrillation (HCC)    a. 11/2018 s/p TEE/DCCV. Maintained on Amio/Eliquis (CHA2DS2VASc =1).  . 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 08/2018 TEE: LAA thrombus.    Past Surgical History:  Procedure Laterality Date  . CARDIOVERSION N/A 08/08/2018   Procedure: CARDIOVERSION;  Surgeon: Antonieta Iba, MD;  Location: [facility] ORS;  Service: Cardiovascular;  Laterality: N/A;  . CARDIOVERSION N/A 11/20/2018   Procedure: CARDIOVERSION;  Surgeon: Yvonne Kendall, MD;  Location: [facility] ORS;  Service: Cardiovascular;  Laterality: N/A;  . CARDIOVERSION N/A 11/20/2018   Procedure: CARDIOVERSION;  Surgeon: Yvonne Kendall, MD;  Location: [facility] OR[facility];  Service: Cardiovascular;  Laterality: N/A;  . CARDIOVERSION N/A 12/07/2018   Procedure: CARDIOVERSION;  Surgeon: Antonieta Iba, MD;  Location: [facility] OR[facility];  Service: Cardiovascular;  Laterality: N/A;  . TEE WITHOUT CARDIOVERSION N/A 08/08/2018   Procedure: TRANSESOPHAGEAL ECHOCARDIOGRAM (TEE);  Surgeon: Antonieta Iba, MD;  Location: [facility] OR[facility];  Service: Cardiovascular;  Laterality: N/A;  . TEE WITHOUT CARDIOVERSION N/A 11/20/2018   Procedure: TRANSESOPHAGEAL ECHOCARDIOGRAM (TEE);  Surgeon: Yvonne Kendall, MD;  Location: [facility] OR[facility];  Service: Cardiovascular;  Laterality: N/A;    Current Medications: No outpatient medications have been marked as taking for the 03/26/20 encounter (Appointment) with Sondra Barges, PA-C.    Allergies:   Patient has no known allergies.   Social History   Socioeconomic History  . Marital status: Widowed    Spouse name: Not on file  . Number of children: Not on file  . Years of education: Not on file  . Highest education level: Not on file  Occupational History  . Not on file  Tobacco Use  . Smoking status: Never Smoker  . Smokeless tobacco: Never Used  Vaping Use  . Vaping Use: Never used  Substance and Sexual Activity  .  Alcohol use: Never  . Drug use: Never  . Sexual activity: Not on file  Other Topics Concern  . Not on file  Social History Narrative  . Not on file   Social Determinants of Health   Financial Resource Strain: Not on file  Food Insecurity: Not on file  Transportation Needs: Not on file  Physical Activity: Not on file  Stress: Not on file  Social Connections: Not on file     Family History:  The patient's family history includes Cancer in his father; Emphysema in his mother.  ROS:   ROS   EKGs/Labs/Other Studies Reviewed:    Studies reviewed were summarized above. The additional studies were reviewed today:  2D echo 08/2019: 1. Patient has severe asymmetric septal hypertrophy with septal wall  measuring up to 64mm. There is systolic anterior motion (SAM) of mitral  valve causing LVOT obstruction with posteriorly directed mitral  regurgitation. LVOT gradient is at rest  increasing to with valvsalva. Recommend Cardiac MR for further  assessment as findings are consistent with HCM.Kenneth Ferguson Left ventricular ejection  fraction, by estimation, is  60 to 65%. The left ventricle has normal  function. The left ventricle has no  regional wall motion abnormalities. There is severe asymmetric left  ventricular hypertrophy of the septal segment. Left ventricular diastolic  parameters are consistent with Grade II diastolic dysfunction  (pseudonormalization).  2. Right ventricular systolic function is normal. The right ventricular  size is mildly enlarged. There is moderately elevated pulmonary artery  systolic pressure.  3. Left atrial size was moderately dilated.  4. The mitral valve is grossly normal. Mild mitral valve regurgitation.  5. The aortic valve is tricuspid. Aortic valve regurgitation is not  Visualized. __________  2D echo 11/2018: 1. Left ventricular ejection fraction, by visual estimation, is 25 to  30%. The left ventricle has moderate to severely  decreased function.  Normal left ventricular size. There is moderately increased left  ventricular hypertrophy.Severe hypokinesis of the  anterior, anteroseptal and apical walls  2. Global right ventricle has moderately reduced systolic function.The  right ventricular size is normal. No increase in right ventricular wall  thickness.  3. Left atrial size was moderately dilated.  4. Mildly elevated pulmonary artery systolic pressure.  5. Arrhythmia noted, rate 133 bpm  __________  TEE 11/2018: 1. Left ventricular ejection fraction, by visual estimation, is 20 to  25%. The left ventricle has severely decreased function. There is  moderately increased left ventricular hypertrophy.  2. Global right ventricle was not assessed.The right ventricular size is  not assessed. Right vetricular wall thickness was not assessed.  3. Left atrial size was not assessed.  4. There is no left atrial or left atrial appendage thrombus.  5. Right atrial size was not assessed.  6. Moderate pericardial effusion.  7. The pericardial effusion is circumferential.  8. The mitral valve is normal in structure. Moderate mitral valve  regurgitation.  9. The tricuspid valve is not assessed. Tricuspid valve regurgitation was  not assessed by color flow Doppler.  10. The aortic valve was not assessed Aortic valve regurgitation was not  assessed by color flow Doppler.  11. The pulmonic valve was not assessed. Pulmonic valve regurgitation was  not assessed by color flow Doppler.  12. Aortic root could not be assessed.  13. The interatrial septum was not assessed.  __________  TEE 08/2018: 1. Evidence of a thrombus present in the left atrial appendage.  Spontaneous contrast noted in the left atrium. Cardioversion was  cancelled.  2. The left ventricle has mild-moderately reduced systolic function, with  an ejection fraction of 40-45%. There is moderately increased left  ventricular wall thickness.  Left ventricular diastolic Doppler parameters  are consistent with indeterminate  diastolic dysfunction.  3. The right ventricle has mildly reduced systolic function. There is no  increase in right ventricular wall thickness. Right ventricular systolic  pressure is mildly elevated.  4. Left atrial size was moderately dilated.  5. Right atrial size was moderately dilated.  6. Mild aortic atherosclerosis noted. __________  2D echo 07/2018: 1. Severe hypokinesis of the left ventricular, entire anteroseptal wall,  apical segment and anterior wall.  2. The left ventricle has mild-moderately reduced systolic function, with  an ejection fraction of 40-45%. The cavity size was normal. There is  moderately increased left ventricular wall thickness. Left ventricular  diastolic function could not be  evaluated secondary to atrial fibrillation.  3. The right ventricle has moderately reduced systolic function. The  cavity was not assessed. There is mildly increased right ventricular wall  thickness. Right ventricular systolic pressure could not be  assessed.  4. Left atrial size was moderately dilated.  5. Moderate pericardial effusion.  6. The pericardial effusion is posterior and lateral to the left  ventricle.  7. The mitral valve is degenerative. Mild thickening of the mitral valve  leaflet.  8. The tricuspid valve is grossly normal.  9. The aortic valve is tricuspid. Mild calcification of the aortic valve.  10. The aortic root is normal in size and structure.  11. The inferior vena cava was dilated in size with <50% respiratory  variability.    EKG:  EKG is ordered today.  The EKG ordered today demonstrates ***  Recent Labs: 11/18/2019: ALT 22; BUN 21; Creatinine, Ser 1.45; Hemoglobin 13.1; Platelets 219; Potassium 3.5; Sodium 136; TSH 2.208  Recent Lipid Panel No results found for: CHOL, TRIG, HDL, CHOLHDL, VLDL, LDLCALC, LDLDIRECT  PHYSICAL EXAM:    VS:  There were no  vitals taken for this visit.  BMI: There is no height or weight on file to calculate BMI.  Physical Exam  Wt Readings from Last 3 Encounters:  11/18/19 294 lb 3.2 oz (133.4 kg)  08/15/19 287 lb (130.2 kg)  03/05/19 262 lb 8 oz (119.1 kg)     ASSESSMENT & PLAN:   1. HFrEF: ***  2. Hypertrophic cardiomyopathy:  3. Persistent A. Fib:  4. HTN: Blood pressure ***  5. Morbid obesity:  Disposition: F/u with Dr. Okey Dupre or an APP in ***, and EP as directed.   Medication Adjustments/Labs and Tests Ordered: Current medicines are reviewed at length with the patient today.  Concerns regarding medicines are outlined above. Medication changes, Labs and Tests ordered today are summarized above and listed in the Patient Instructions accessible in Encounters.   Signed, Eula Listen, PA-C 03/25/2020 8:09 AM     [facility] [address] [place], [place] [address] [phone]

## 2020-03-26 ENCOUNTER — Ambulatory Visit: Payer: Self-pay | Admitting: Physician Assistant

## 2020-04-01 ENCOUNTER — Ambulatory Visit: Payer: Self-pay | Admitting: Internal Medicine

## 2020-04-06 ENCOUNTER — Telehealth: Payer: Self-pay | Admitting: Internal Medicine

## 2020-04-06 NOTE — Telephone Encounter (Signed)
Received letter in the mail from [employer] concerning coverage for Eliquis. The letter states "As of February 08, 2020, Eliquis is no longer covered for this patient."  Covered medications include Warfarin and Xarelto.  Please advise and call pt.

## 2020-04-07 NOTE — Telephone Encounter (Signed)
Appears this has been handled by PharmD at the end of January. Closing this encounter.

## 2020-05-08 ENCOUNTER — Other Ambulatory Visit: Payer: Self-pay | Admitting: Internal Medicine

## 2020-05-08 NOTE — Telephone Encounter (Signed)
Eliquis 5mg  refill request received. Patient is 55 years old, weight-133.4kg, Crea-1.45 on 11/18/2019, Diagnosis-Afib, and last seen by Dr. 01/18/2020 on 03/13/2020. Dose is appropriate based on dosing criteria. Will send in refill to requested pharmacy.

## 2020-05-08 NOTE — Telephone Encounter (Signed)
Refill Request.  

## 2020-06-27 ENCOUNTER — Other Ambulatory Visit: Payer: Self-pay | Admitting: Internal Medicine

## 2020-06-29 ENCOUNTER — Telehealth: Payer: Self-pay | Admitting: Internal Medicine

## 2020-06-29 MED ORDER — ELIQUIS 5 MG PO TABS: ORAL_TABLET | ORAL | 1 refills | Status: DC

## 2020-06-29 NOTE — Telephone Encounter (Signed)
Refill request

## 2020-06-29 NOTE — Telephone Encounter (Signed)
3m, 133.4kg, scr 1.45 11/18/19, lovw/lambert 03/13/20

## 2020-06-29 NOTE — Telephone Encounter (Signed)
*  STAT* If patient is at the pharmacy, call can be transferred to refill team.   1. Which medications need to be refilled? (please list name of each medication and dose if known)   Eliquis 5 mg po BID   2. Which pharmacy/location (including street and city if local pharmacy) is medication to be sent to? [facility] FOR THIS RX   3. Do they need a 30 day or 90 day supply? 90    Provider does not escribe please send signed rx to confirmed fax [phone]

## 2020-06-29 NOTE — Telephone Encounter (Signed)
Pt overdue for 3 month f/u.  Pt last seen 11/2019. Please contact pt for future appointment pt needing refills.

## 2020-06-29 NOTE — Telephone Encounter (Signed)
Scheduled

## 2020-07-10 NOTE — Progress Notes (Deleted)
Cardiology Office Note    Date:  07/10/2020   ID:  Kenneth Ferguson, DOB [birth year], MRN [id number]  PCP:  Patient, [name] (Inactive)  Cardiologist:  Yvonne Kendall, MD  Electrophysiologist:  Lanier Prude, MD   Chief Complaint: ***  History of Present Illness:   Kenneth Ferguson is a 55 y.o. male with history of HFrEF, hypertrophic cardiomyopathy, PAF, left atrial appendage thrombus, morbid obesity, and acute stress reaction who presents for follow-up of ***.  His cardiac history dates back to the early 2000's, when he was living in [place].  He was reportedly noted to have a murmur on a routine physical and was referred to cardiology.  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  No further details are available for review.  He did well until 2015, when he was diagnosed with A. fib.  He underwent successful TEE guided DCCV at an outside hospital.  Apparently, diagnostic LHC was performed sometime after and he was told he had normal coronary arteries.  In 2018, he was apparently admitted with recurrent A. fib and was also diagnosed with hypertrophic cardiomyopathy at [facility].  LifeVest was placed, though self discontinued.  He was subsequently lost to follow-up.  In 06/2018, he suffered the unexpected loss of his wife which resulted in acute stress reaction and recurrent A. fib.  He subsequently noted worsening dyspnea despite treatment for bronchitis followed by bilateral lower extremity edema.  He was seen in an outside hospital and diagnosed with A. fib with RVR and placed on a beta-blocker.  He was seen by cardiology in [place] in 07/2018 with recommendation for follow through with prior recommended work-up including echo, outpatient cardiac monitoring, and cardiac MRI.  In late 07/2018, he presented to [facility] with A. fib with RVR as well as acute systolic CHF.  Echo showed an EF of 40 to 45% with moderately increased LV wall thickness.  TEE was performed with a plan for DCCV, however left atrial  appendage thrombus was noted and the decision was made to continue anticoagulation with plans for cardioversion in the future.  He deferred scheduling for several months related to his work schedule and ultimately, in 11/2018 he presented with worsening heart failure and rapid A. fib.  He required admission to the ICU with inotropic support and eventual initiation of amiodarone followed by TEE guided cardioversion.  When he was seen in 02/2019 he was feeling well with plans for repeat echo at that time to evaluate his LV systolic function and sinus rhythm.  He ultimately had this performed in late 07/2019 which showed an EF of 60 to 65% with severe asymmetric septal hypertrophy (septal wall 20 mm), systolic anterior motion of the mitral valve causing LVOT obstruction with posterior directed mitral regurgitation.  LVOT gradient was 17 mmHg at rest and increased to 49 mmHg with Valsalva.  Cardiac MRI was again recommended though he deferred and remains pending at this time.  He was last seen in 11/2019, and was feeling better than he had in quite some time.  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  He attributed his 30 pound weight gain over the preceding 9 months to exercise.  ***   Labs independently reviewed: 11/2019 - potassium 3.5, BUN 21, SCr 1.45, albumin 4.1, AST/ALT normal, TSH normal, HGB 13.1, PLT 219 11/2018 - magnesium 2.1 07/2018 - A1c 4.8  Past Medical History:  Diagnosis Date  . HFrEF (heart failure with  reduced ejection fraction) (HCC)    a. 08/2018 Echo: EF 40-45% in setting of Afib; b. 11/2018 Echo: EF 25-30% w/ sev ant, antsept, and apical HK in setting of Afib w/ RVR; b. 08/2019 Echo: EF 60-65%.  Marland Kitchen HOCM (hypertrophic obstructive cardiomyopathy) (HCC)    a. Prev dx in early 2000s?; b. 08/2019 Echo: EF 60-65%, sev asym septal hypertrophy w/ septal wall of 45mm. SAM. LVOT grad of @ rest, w/ valsalva. Gr2 DD. Nl RV fxn. Mod  dil LA. Mild MR.  . Morbid obesity (HCC)   . Persistent atrial fibrillation (HCC)    a. 11/2018 s/p TEE/DCCV. Maintained on Amio/Eliquis (CHA2DS2VASc =1).  . 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 08/2018 TEE: LAA thrombus.    Past Surgical History:  Procedure Laterality Date  . CARDIOVERSION N/A 08/08/2018   Procedure: CARDIOVERSION;  Surgeon: Antonieta Iba, MD;  Location: [facility] ORS;  Service: Cardiovascular;  Laterality: N/A;  . CARDIOVERSION N/A 11/20/2018   Procedure: CARDIOVERSION;  Surgeon: Yvonne Kendall, MD;  Location: [facility];  Service: Cardiovascular;  Laterality: N/A;  . CARDIOVERSION N/A 11/20/2018   Procedure: CARDIOVERSION;  Surgeon: Yvonne Kendall, MD;  Location: [facility];  Service: Cardiovascular;  Laterality: N/A;  . CARDIOVERSION N/A 12/07/2018   Procedure: CARDIOVERSION;  Surgeon: Antonieta Iba, MD;  Location: [facility];  Service: Cardiovascular;  Laterality: N/A;  . TEE WITHOUT CARDIOVERSION N/A 08/08/2018   Procedure: TRANSESOPHAGEAL ECHOCARDIOGRAM (TEE);  Surgeon: Antonieta Iba, MD;  Location: [facility];  Service: Cardiovascular;  Laterality: N/A;  . TEE WITHOUT CARDIOVERSION N/A 11/20/2018   Procedure: TRANSESOPHAGEAL ECHOCARDIOGRAM (TEE);  Surgeon: Yvonne Kendall, MD;  Location: [facility];  Service: Cardiovascular;  Laterality: N/A;    Current Medications: No outpatient medications have been marked as taking for the 07/14/20 encounter (Appointment) with Sondra Barges, PA-C.    Allergies:   Patient has no known allergies.   Social History   Socioeconomic History  . Marital status: Widowed    Spouse name: Not on file  . Number of children: Not on file  . Years of education: Not on file  . Highest education level: Not on file  Occupational History  . Not on file  Tobacco Use  . Smoking status: Never Smoker  . Smokeless tobacco: Never Used  Vaping Use  . Vaping Use: Never used  Substance and Sexual  Activity  . Alcohol use: Never  . Drug use: Never  . Sexual activity: Not on file  Other Topics Concern  . Not on file  Social History Narrative  . Not on file   Social Determinants of Health   Financial Resource Strain: Not on file  Food Insecurity: Not on file  Transportation Needs: Not on file  Physical Activity: Not on file  Stress: Not on file  Social Connections: Not on file     Family History:  The patient's family history includes Cancer in his father; Emphysema in his mother.  ROS:   ROS   EKGs/Labs/Other Studies Reviewed:    Studies reviewed were summarized above. The additional studies were reviewed today:  2D echo 08/2019: 1. Patient has severe asymmetric septal hypertrophy with septal wall  measuring up to 52mm. There is systolic anterior motion (SAM) of mitral  valve causing LVOT obstruction with posteriorly directed mitral  regurgitation. LVOT gradient is at rest  increasing to with valvsalva. Recommend Cardiac MR for further  assessment as findings are consistent with HCM.Marland Kitchen  Left ventricular ejection  fraction, by estimation, is 60 to 65%. The left ventricle has normal  function. The left ventricle has no  regional wall motion abnormalities. There is severe asymmetric left  ventricular hypertrophy of the septal segment. Left ventricular diastolic  parameters are consistent with Grade II diastolic dysfunction  (pseudonormalization).  2. Right ventricular systolic function is normal. The right ventricular  size is mildly enlarged. There is moderately elevated pulmonary artery  systolic pressure.  3. Left atrial size was moderately dilated.  4. The mitral valve is grossly normal. Mild mitral valve regurgitation.  5. The aortic valve is tricuspid. Aortic valve regurgitation is not  visualized.  __________  Limited echo 11/2018: 1. Left ventricular ejection fraction, by visual estimation, is 25 to  30%. The left ventricle has moderate  to severely decreased function.  Normal left ventricular size. There is moderately increased left  ventricular hypertrophy.Severe hypokinesis of the  anterior, anteroseptal and apical walls  2. Global right ventricle has moderately reduced systolic function.The  right ventricular size is normal. No increase in right ventricular wall  thickness.  3. Left atrial size was moderately dilated.  4. Mildly elevated pulmonary artery systolic pressure.  5. Arrhythmia noted, rate 133 bpm  __________  TEE 11/2018: 1. Left ventricular ejection fraction, by visual estimation, is 20 to  25%. The left ventricle has severely decreased function. There is  moderately increased left ventricular hypertrophy.  2. Global right ventricle was not assessed.The right ventricular size is  not assessed. Right vetricular wall thickness was not assessed.  3. Left atrial size was not assessed.  4. There is no left atrial or left atrial appendage thrombus.  5. Right atrial size was not assessed.  6. Moderate pericardial effusion.  7. The pericardial effusion is circumferential.  8. The mitral valve is normal in structure. Moderate mitral valve  regurgitation.  9. The tricuspid valve is not assessed. Tricuspid valve regurgitation was  not assessed by color flow Doppler.  10. The aortic valve was not assessed Aortic valve regurgitation was not  assessed by color flow Doppler.  11. The pulmonic valve was not assessed. Pulmonic valve regurgitation was  not assessed by color flow Doppler.  12. Aortic root could not be assessed.  13. The interatrial septum was not assessed. __________  TEE 08/2018: 1. Evidence of a thrombus present in the left atrial appendage.  Spontaneous contrast noted in the left atrium. Cardioversion was  cancelled.  2. The left ventricle has mild-moderately reduced systolic function, with  an ejection fraction of 40-45%. There is moderately increased left  ventricular wall  thickness. Left ventricular diastolic Doppler parameters  are consistent with indeterminate  diastolic dysfunction.  3. The right ventricle has mildly reduced systolic function. There is no  increase in right ventricular wall thickness. Right ventricular systolic  pressure is mildly elevated.  4. Left atrial size was moderately dilated.  5. Right atrial size was moderately dilated.  6. Mild aortic atherosclerosis noted.  __________  2D echo 07/2018: 1. Severe hypokinesis of the left ventricular, entire anteroseptal wall,  apical segment and anterior wall.  2. The left ventricle has mild-moderately reduced systolic function, with  an ejection fraction of 40-45%. The cavity size was normal. There is  moderately increased left ventricular wall thickness. Left ventricular  diastolic function could not be  evaluated secondary to atrial fibrillation.  3. The right ventricle has moderately reduced systolic function. The  cavity was not assessed. There is mildly increased right ventricular wall  thickness. Right ventricular systolic pressure could not be assessed.  4. Left atrial size was moderately dilated.  5. Moderate pericardial effusion.  6. The pericardial effusion is posterior and lateral to the left  ventricle.  7. The mitral valve is degenerative. Mild thickening of the mitral valve  leaflet.  8. The tricuspid valve is grossly normal.  9. The aortic valve is tricuspid. Mild calcification of the aortic valve.  10. The aortic root is normal in size and structure.  11. The inferior vena cava was dilated in size with <50% respiratory  variability.    EKG:  EKG is ordered today.  The EKG ordered today demonstrates ***  Recent Labs: 11/18/2019: ALT 22; BUN 21; Creatinine, Ser 1.45; Hemoglobin 13.1; Platelets 219; Potassium 3.5; Sodium 136; TSH 2.208  Recent Lipid Panel No results found for: CHOL, TRIG, HDL, CHOLHDL, VLDL, LDLCALC, LDLDIRECT  PHYSICAL EXAM:    VS:   There were no vitals taken for this visit.  BMI: There is no height or weight on file to calculate BMI.  Physical Exam  Wt Readings from Last 3 Encounters:  11/18/19 294 lb 3.2 oz (133.4 kg)  08/15/19 287 lb (130.2 kg)  03/05/19 262 lb 8 oz (119.1 kg)     ASSESSMENT & PLAN:   1. HFrEF thought to be nonischemic and tachycardia mediated with subsequent recovery of LVEF:  2. Hypertrophic cardiomyopathy:  3. Persistent Afib: ***.  CHA2DS2-VASc at least 1 (CHF).  4. HTN: Blood pressure ***  5. Morbid obesity:  Disposition: F/u with Dr. Okey Dupre or an APP in ***, and EP as directed.   Medication Adjustments/Labs and Tests Ordered: Current medicines are reviewed at length with the patient today.  Concerns regarding medicines are outlined above. Medication changes, Labs and Tests ordered today are summarized above and listed in the Patient Instructions accessible in Encounters.   Signed, Eula Listen, PA-C 07/10/2020 2:03 PM     [facility] [address] [place], [place] [address] [phone]

## 2020-07-13 ENCOUNTER — Telehealth: Payer: Self-pay | Admitting: Internal Medicine

## 2020-07-13 MED ORDER — ELIQUIS 5 MG PO TABS: ORAL_TABLET | ORAL | 1 refills | Status: DC

## 2020-07-13 NOTE — Telephone Encounter (Signed)
Pt's age 55, wt 133.4 kg, SCr 1.45, CrCl 107.33, last ov w/ CL 03/13/20.

## 2020-07-13 NOTE — Telephone Encounter (Signed)
Refill request for Eliquis

## 2020-07-13 NOTE — Addendum Note (Signed)
Addended by: Rhea Belton R on: 07/13/2020 02:10 PM   Modules accepted: Orders

## 2020-07-13 NOTE — Telephone Encounter (Signed)
*  STAT* If patient is at the pharmacy, call can be transferred to refill team.   1. Which medications need to be refilled? (please list name of each medication and dose if known) Eliquis 5mg   2. Which pharmacy/location (including street and city if local pharmacy) is medication to be sent to?[facility] fx: [phone]  3. Do they need a 30 day or 90 day supply? 90   New pharmacy, will need to be faxed to (fx: [phone])

## 2020-07-14 ENCOUNTER — Ambulatory Visit: Payer: Self-pay | Admitting: Physician Assistant

## 2020-08-28 ENCOUNTER — Ambulatory Visit: Payer: Self-pay | Admitting: Internal Medicine

## 2020-08-28 NOTE — Progress Notes (Deleted)
Follow-up Outpatient Visit Date: 08/28/2020  Primary Care Provider: Patient, [name] (Inactive) No address on file  Chief Complaint: ***  HPI:  [name] is a 55 y.o. male with history of chronic HFrEF with normalization of LVEF, persistent atrial fibrillation complicated by left atrial appendage thrombus, and hypertrophic cardiomyopathy, who presents for follow-up of heart failure and atrial fibrillation.  I last saw him in 11/2019, which I [name] reporting feeling better than he had in a long time.  Cardiac MRI was pending, though it does not appear that Mr. Streett ever moved forward with this.  He was referred to EP to discuss rhythm control options besides amiodarone.  He did not show for this visit.  He has also canceled multiple subsequent appointments with me and our APP's.  --------------------------------------------------------------------------------------------------  Past Medical History:  Diagnosis Date   HFrEF (heart failure with reduced ejection fraction) (HCC)    a. 08/2018 Echo: EF 40-45% in setting of Afib; b. 11/2018 Echo: EF 25-30% w/ sev ant, antsept, and apical HK in setting of Afib w/ RVR; b. 08/2019 Echo: EF 60-65%.   HOCM (hypertrophic obstructive cardiomyopathy) (HCC)    a. Prev dx in early 2000s?; b. 08/2019 Echo: EF 60-65%, sev asym septal hypertrophy w/ septal wall of 22mm. SAM. LVOT grad of @ rest, w/ valsalva. Gr2 DD. Nl RV fxn. Mod dil LA. Mild MR.   Morbid obesity (HCC)    Persistent atrial fibrillation (HCC)    a. 11/2018 s/p TEE/DCCV. Maintained on Amio/Eliquis (CHA2DS2VASc =1).   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 08/2018 TEE: LAA thrombus.   Past Surgical History:  Procedure Laterality Date   CARDIOVERSION N/A 08/08/2018   Procedure: CARDIOVERSION;  Surgeon: Antonieta Iba, MD;  Location: [facility];  Service: Cardiovascular;  Laterality: N/A;   CARDIOVERSION N/A 11/20/2018    Procedure: CARDIOVERSION;  Surgeon: Yvonne Kendall, MD;  Location: [facility];  Service: Cardiovascular;  Laterality: N/A;   CARDIOVERSION N/A 11/20/2018   Procedure: CARDIOVERSION;  Surgeon: Yvonne Kendall, MD;  Location: [facility];  Service: Cardiovascular;  Laterality: N/A;   CARDIOVERSION N/A 12/07/2018   Procedure: CARDIOVERSION;  Surgeon: Antonieta Iba, MD;  Location: [facility];  Service: Cardiovascular;  Laterality: N/A;   TEE WITHOUT CARDIOVERSION N/A 08/08/2018   Procedure: TRANSESOPHAGEAL ECHOCARDIOGRAM (TEE);  Surgeon: Antonieta Iba, MD;  Location: [facility];  Service: Cardiovascular;  Laterality: N/A;   TEE WITHOUT CARDIOVERSION N/A 11/20/2018   Procedure: TRANSESOPHAGEAL ECHOCARDIOGRAM (TEE);  Surgeon: Yvonne Kendall, MD;  Location: [facility];  Service: Cardiovascular;  Laterality: N/A;    No outpatient medications have been marked as taking for the 08/28/20 encounter (Appointment) with Kenneth Ferguson, Kenneth Deer, MD.    Allergies: Patient has no known allergies.  Social History   Tobacco Use   Smoking status: Never   Smokeless tobacco: Never  Vaping Use   Vaping Use: Never used  Substance Use Topics   Alcohol use: Never   Drug use: Never    Family History  Problem Relation Age of Onset   Emphysema Mother    Cancer Father     Review of Systems: A 12-system review of systems was performed and was negative except as noted in the HPI.  --------------------------------------------------------------------------------------------------  Physical Exam: There were no vitals taken for this visit.  General:  NAD. Neck: No JVD or HJR. Lungs: Clear to auscultation bilaterally without wheezes or crackles. Heart: Regular rate and rhythm without murmurs, rubs,  or gallops. Abdomen: Soft, nontender, nondistended. Extremities: No lower extremity edema.  EKG:  ***  Lab Results  Component Value Date   WBC 5.7 11/18/2019   HGB 13.1 11/18/2019   HCT 38.2 (L) 11/18/2019   MCV  90.7 11/18/2019   PLT 219 11/18/2019    Lab Results  Component Value Date   NA 136 11/18/2019   K 3.5 11/18/2019   CL 102 11/18/2019   CO2 26 11/18/2019   BUN 21 (H) 11/18/2019   CREATININE 1.45 (H) 11/18/2019   GLUCOSE 104 (H) 11/18/2019   ALT 22 11/18/2019    No results found for: CHOL, HDL, LDLCALC, LDLDIRECT, TRIG, CHOLHDL  --------------------------------------------------------------------------------------------------  ASSESSMENT AND PLAN: ***  Yvonne Kendall, MD 08/28/2020 6:48 AM

## 2020-08-29 ENCOUNTER — Other Ambulatory Visit: Payer: Self-pay | Admitting: Internal Medicine

## 2020-08-31 ENCOUNTER — Other Ambulatory Visit: Payer: Self-pay

## 2020-08-31 MED ORDER — SPIRONOLACTONE 25 MG PO TABS: ORAL_TABLET | ORAL | 0 refills | Status: DC

## 2020-08-31 NOTE — Telephone Encounter (Signed)
*  STAT* If patient is at the pharmacy, call can be transferred to refill team.   1. Which medications need to be refilled? (please list name of each medication and dose if known) Spironolactone  2. Which pharmacy/location (including street and city if local pharmacy) is medication to be sent to? [facility] [address]  3. Do they need a 30 day or 90 day supply? 90

## 2020-08-31 NOTE — Telephone Encounter (Signed)
Please advise if ok to refill Spironolactone 25 mg qd. Pt is overdue for 3 month f/u. Pt has scheduled future appointment for 10/2020.

## 2020-08-31 NOTE — Telephone Encounter (Signed)
Pt requesting 90 day supply

## 2020-09-14 ENCOUNTER — Telehealth: Payer: Self-pay | Admitting: Internal Medicine

## 2020-09-14 NOTE — Telephone Encounter (Signed)
Eliquis requires Prior  Auth be initiated by going to [email or web address]  Please submit via fax on form .

## 2020-09-14 NOTE — Telephone Encounter (Signed)
Disregard last request from [facility] care pharmacy. Per pharmacy was sent in error

## 2020-09-23 ENCOUNTER — Other Ambulatory Visit: Payer: Self-pay | Admitting: Internal Medicine

## 2020-10-05 ENCOUNTER — Telehealth: Payer: Self-pay | Admitting: Internal Medicine

## 2020-10-05 DIAGNOSIS — I4891 Unspecified atrial fibrillation: Secondary | ICD-10-CM

## 2020-10-05 MED ORDER — APIXABAN 5 MG PO TABS: ORAL_TABLET | ORAL | 0 refills | Status: DC

## 2020-10-05 NOTE — Telephone Encounter (Signed)
*  STAT* If patient is at the pharmacy, call can be transferred to refill team.   1. Which medications need to be refilled? (please list name of each medication and dose if known)  Eliquis 5 MG 1 tablet twice daily   2. Which pharmacy/location (including street and city if local pharmacy) is medication to be sent to? [facility] near [employer]   3. Do they need a 30 day or 90 day supply? 90 day

## 2020-10-05 NOTE — Telephone Encounter (Signed)
Prescription refill request for Eliquis received. Indication: a fib Last office visit: 03/13/20 Scr:1.45 Age: 55 Weight: 133kg

## 2020-10-16 ENCOUNTER — Other Ambulatory Visit: Payer: Self-pay

## 2020-10-16 ENCOUNTER — Encounter: Payer: Self-pay | Admitting: Internal Medicine

## 2020-10-16 ENCOUNTER — Ambulatory Visit (INDEPENDENT_AMBULATORY_CARE_PROVIDER_SITE_OTHER): Payer: No Typology Code available for payment source | Admitting: Internal Medicine

## 2020-10-16 VITALS — BP 100/80 | HR 71 | Ht 67.0 in | Wt 289.0 lb

## 2020-10-16 DIAGNOSIS — I5022 Chronic systolic (congestive) heart failure: Secondary | ICD-10-CM | POA: Diagnosis not present

## 2020-10-16 DIAGNOSIS — I4819 Other persistent atrial fibrillation: Secondary | ICD-10-CM

## 2020-10-16 DIAGNOSIS — I422 Other hypertrophic cardiomyopathy: Secondary | ICD-10-CM

## 2020-10-16 DIAGNOSIS — I5020 Unspecified systolic (congestive) heart failure: Secondary | ICD-10-CM

## 2020-10-16 MED ORDER — POTASSIUM CHLORIDE CRYS ER 20 MEQ PO TBCR: 20.0000 meq | EXTENDED_RELEASE_TABLET | Freq: Every day | ORAL | 1 refills | Status: DC | PRN

## 2020-10-16 MED ORDER — TORSEMIDE 20 MG PO TABS
20.0000 mg | ORAL_TABLET | Freq: Every day | ORAL | 0 refills | Status: DC | PRN
Start: 2020-10-16 — End: 2020-12-17

## 2020-10-16 NOTE — Patient Instructions (Signed)
Medication Instructions:   Your physician has recommended you make the following change in your medication:   1) STOP Spironolactone  2) CHANGE Torsemide 20 mg daily AS NEEDED   3) CHANGE Potassium 20 mEq - Only take when you take a Torsemide AS NEEDED  *If you need a refill on your cardiac medications before your next appointment, please call your pharmacy*   Lab Work:  TODAY: CBC, CMET, TSH  If you have labs (blood work) drawn today and your tests are completely normal, you will receive your results only by: MyChart Message (if you have MyChart) OR A paper copy in the mail If you have any lab test that is abnormal or we need to change your treatment, we will call you to review the results.   Testing/Procedures:  None ordered    Follow-Up: At [facility], you and your health needs are our priority.  As part of our continuing mission to provide you with exceptional heart care, we have created designated Provider Care Teams.  These Care Teams include your primary Cardiologist (physician) and Advanced Practice Providers (APPs -  Physician Assistants and Nurse Practitioners) who all work together to provide you with the care you need, when you need it.  We recommend signing up for the patient portal called "MyChart".  Sign up information is provided on this After Visit Summary.  MyChart is used to connect with patients for Virtual Visits (Telemedicine).  Patients are able to view lab/test results, encounter notes, upcoming appointments, etc.  Non-urgent messages can be sent to your provider as well.   To learn more about what you can do with MyChart, go to [email or web address].    Your next appointment:   3 month(s)  The format for your next appointment:   In Person  Provider:   You may see Yvonne Kendall, MD or one of the following Advanced Practice Providers on your designated Care Team:   Nicolasa Ducking, NP Eula Listen, PA-C Marisue Ivan, PA-C Cadence Fransico Michael,  [place]   Other Instructions  You have been referred to Electrophysiology.

## 2020-10-16 NOTE — Progress Notes (Signed)
Follow-up Outpatient Visit Date: 10/16/2020  Primary Care Provider: Patient, [name] (Inactive) No address on file  Chief Complaint: Follow-up heart failure and atrial fibrillation  HPI:  [name] is a 55 y.o. male with history of chronic HFrEF with normalization of LVEF, persistent atrial fibrillation complicated by left atrial appendage thrombus, hypertrophic cardiomyopathy, and morbid obesity, who presents for follow-up of heart failure and atrial fibrillation.  I last saw him in 11/2019, at which time he was feeling better than he had in a long time.  Unfortunately, he has canceled multiple subsequent follow-up visits with [place].  He was scheduled to see Dr. Lalla Brothers in early February but did not arrive for this visit.  Today, [name] reports that he has been feeling very well.  He is staying busy at work and is exercising regularly.  He denies chest pain, shortness of breath, palpitations, lightheadedness, and edema.  He remains compliant with his medications but would like to cut down on the number of medications that he is taking, if possible.  --------------------------------------------------------------------------------------------------  Past Medical History:  Diagnosis Date   HFrEF (heart failure with reduced ejection fraction) (HCC)    a. 08/2018 Echo: EF 40-45% in setting of Afib; b. 11/2018 Echo: EF 25-30% w/ sev ant, antsept, and apical HK in setting of Afib w/ RVR; b. 08/2019 Echo: EF 60-65%.   HOCM (hypertrophic obstructive cardiomyopathy) (HCC)    a. Prev dx in early 2000s?; b. 08/2019 Echo: EF 60-65%, sev asym septal hypertrophy w/ septal wall of 62mm. SAM. LVOT grad of @ rest, w/ valsalva. Gr2 DD. Nl RV fxn. Mod dil LA. Mild MR.   Morbid obesity (HCC)    Persistent atrial fibrillation (HCC)    a. 11/2018 s/p TEE/DCCV. Maintained on Amio/Eliquis (CHA2DS2VASc =1).   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 08/2018  TEE: LAA thrombus.   Past Surgical History:  Procedure Laterality Date   CARDIOVERSION N/A 08/08/2018   Procedure: CARDIOVERSION;  Surgeon: Antonieta Iba, MD;  Location: [facility];  Service: Cardiovascular;  Laterality: N/A;   CARDIOVERSION N/A 11/20/2018   Procedure: CARDIOVERSION;  Surgeon: Yvonne Kendall, MD;  Location: [facility];  Service: Cardiovascular;  Laterality: N/A;   CARDIOVERSION N/A 11/20/2018   Procedure: CARDIOVERSION;  Surgeon: Yvonne Kendall, MD;  Location: [facility];  Service: Cardiovascular;  Laterality: N/A;   CARDIOVERSION N/A 12/07/2018   Procedure: CARDIOVERSION;  Surgeon: Antonieta Iba, MD;  Location: [facility];  Service: Cardiovascular;  Laterality: N/A;   TEE WITHOUT CARDIOVERSION N/A 08/08/2018   Procedure: TRANSESOPHAGEAL ECHOCARDIOGRAM (TEE);  Surgeon: Antonieta Iba, MD;  Location: [facility];  Service: Cardiovascular;  Laterality: N/A;   TEE WITHOUT CARDIOVERSION N/A 11/20/2018   Procedure: TRANSESOPHAGEAL ECHOCARDIOGRAM (TEE);  Surgeon: Yvonne Kendall, MD;  Location: [facility];  Service: Cardiovascular;  Laterality: N/A;     Current Meds  Medication Sig   amiodarone (PACERONE) 200 MG tablet TAKE 1 TABLET(200 MG) BY MOUTH DAILY   apixaban (ELIQUIS) 5 MG TABS tablet TAKE 1 TABLET(5 MG) BY MOUTH TWICE DAILY   bisoprolol (ZEBETA) 5 MG tablet Take 0.5 tablets (2.5 mg total) by mouth daily.   losartan (COZAAR) 25 MG tablet TAKE 1/2 TABLET(12.5 MG) BY MOUTH DAILY   potassium chloride SA (KLOR-CON) 20 MEQ tablet TAKE 2 TABLETS(40 MEQ) BY MOUTH DAILY   spironolactone (ALDACTONE) 25 MG tablet TAKE 1 TABLET(25 MG) BY MOUTH DAILY.   torsemide (DEMADEX) 20 MG tablet TAKE 2  TABLETS BY MOUTH TWICE DAILY    Allergies: Patient has no known allergies.  Social History   Tobacco Use   Smoking status: Never   Smokeless tobacco: Never  Vaping Use   Vaping Use: Never used  Substance Use Topics   Alcohol use: Never   Drug use: Never    Family History   Problem Relation Age of Onset   Emphysema Mother    Cancer Father     Review of Systems: A 12-system review of systems was performed and was negative except as noted in the HPI.  --------------------------------------------------------------------------------------------------  Physical Exam: BP 100/80 (BP Location: Left Arm, Patient Position: Sitting, Cuff Size: Large)   Pulse 71   Ht 5\' 7"  (1.702 m)   Wt 289 lb (131.1 kg)   SpO2 96%   BMI 45.26 kg/m   General:  NAD. Neck: No gross JVD, though body habitus limits evaluation. Lungs: Clear to auscultation bilaterally without wheezes or crackles. Heart: Regular rate and rhythm with 1/6 systolic murmur. Abdomen: Soft, nontender, nondistended. Extremities: No lower extremity edema.  EKG:  Normal sinus rhythm with incomplete LBBB and mildly prolonged QT (QTc 480 ms).  No significant change from prior tracing on 11/18/2019.  Lab Results  Component Value Date   WBC 5.7 11/18/2019   HGB 13.1 11/18/2019   HCT 38.2 (L) 11/18/2019   MCV 90.7 11/18/2019   PLT 219 11/18/2019    Lab Results  Component Value Date   NA 136 11/18/2019   K 3.5 11/18/2019   CL 102 11/18/2019   CO2 26 11/18/2019   BUN 21 (H) 11/18/2019   CREATININE 1.45 (H) 11/18/2019   GLUCOSE 104 (H) 11/18/2019   ALT 22 11/18/2019    No results found for: CHOL, HDL, LDLCALC, LDLDIRECT, TRIG, CHOLHDL  --------------------------------------------------------------------------------------------------  ASSESSMENT AND PLAN: Chronic HFrEF with recovered ejection fraction and hypertrophic cardiomyopathy: [name] appears euvolemic and is without symptoms (NYHA class I).  Echo last summer showed normalization of LVEF with findings consistent with HOCM.  As he would like to decrease the number of medications that he is taking, we have agreed to switching standing torsemide and potassium chloride to as needed dosing (torsemide 20 mg daily as needed for swelling/weight  gain along with potassium chloride 20 mEq daily when taking torsemide).  We will also stop spironolactone.  Current doses of bisoprolol and losartan will be continued.  Refer to EP again made to discuss further management of persistent a-fib and HOCM.  Persistent atrial fibrillatoin: [name] is maintaining sinus rhythm on amiodarone and carvedilol.  As previously noted, I am concerned about continuing him on long-term amiodarone, though he had early recurrence of atrial fibrillation following initial cardioversion in 2020.  [name] is agreeable to see EP; in the meantime we will continue amiodarone and apixaban.  I will check a CBC, CMP, and TSH today.  Morbid obesity: BMI remains > 40; weight loss encouraged through diet and exercise.  Follow-up: Return to clinic in 3 months.  Kenneth Dredge, MD 10/16/2020 1:38 PM

## 2020-10-17 ENCOUNTER — Encounter: Payer: Self-pay | Admitting: Internal Medicine

## 2020-10-17 LAB — COMPREHENSIVE METABOLIC PANEL
ALT: 17 IU/L (ref 0–44)
AST: 24 IU/L (ref 0–40)
Albumin/Globulin Ratio: 1.8 (ref 1.2–2.2)
Albumin: 4.4 g/dL (ref 3.8–4.9)
Alkaline Phosphatase: 107 IU/L (ref 44–121)
BUN/Creatinine Ratio: 13 (ref 9–20)
BUN: 17 mg/dL (ref 6–24)
Bilirubin Total: 0.8 mg/dL (ref 0.0–1.2)
CO2: 23 mmol/L (ref 20–29)
Calcium: 9 mg/dL (ref 8.7–10.2)
Chloride: 98 mmol/L (ref 96–106)
Creatinine, Ser: 1.34 mg/dL — ABNORMAL HIGH (ref 0.76–1.27)
Globulin, Total: 2.5 g/dL (ref 1.5–4.5)
Glucose: 78 mg/dL (ref 65–99)
Potassium: 3.5 mmol/L (ref 3.5–5.2)
Sodium: 137 mmol/L (ref 134–144)
Total Protein: 6.9 g/dL (ref 6.0–8.5)
eGFR: 62 mL/min/{1.73_m2} (ref >59)

## 2020-10-17 LAB — CBC
Hematocrit: 38.7 % (ref 37.5–51.0)
Hemoglobin: 13.2 g/dL (ref 13.0–17.7)
MCH: 30.7 pg (ref 26.6–33.0)
MCHC: 34.1 g/dL (ref 31.5–35.7)
MCV: 90 fL (ref 79–97)
Platelets: 265 10*3/uL (ref 150–450)
RBC: 4.3 x10E6/uL (ref 4.14–5.80)
RDW: 13.3 % (ref 11.6–15.4)
WBC: 6.3 10*3/uL (ref 3.4–10.8)

## 2020-10-17 LAB — TSH: TSH: 2.29 u[IU]/mL (ref 0.450–4.500)

## 2020-10-20 ENCOUNTER — Telehealth: Payer: Self-pay | Admitting: *Deleted

## 2020-10-20 NOTE — Telephone Encounter (Signed)
-----   Message from Yvonne Kendall, MD sent at 10/19/2020  7:13 AM EDT ----- Please let [name] know that his labs are stable.  He should continue his medications as discussed at or office visit last week and follow-up as previously arranged.

## 2020-10-20 NOTE — Telephone Encounter (Signed)
The patient has been notified of the result and verbalized understanding.  All questions (if any) were answered.     

## 2020-10-20 NOTE — Telephone Encounter (Signed)
Attempted to call pt. No answer. Lmtcb.  

## 2020-10-20 NOTE — Telephone Encounter (Signed)
Patient returning call.

## 2020-10-23 ENCOUNTER — Telehealth: Payer: Self-pay | Admitting: Internal Medicine

## 2020-10-23 MED ORDER — POTASSIUM CHLORIDE CRYS ER 20 MEQ PO TBCR: 20.0000 meq | EXTENDED_RELEASE_TABLET | Freq: Every day | ORAL | 1 refills | Status: DC | PRN

## 2020-10-23 NOTE — Telephone Encounter (Signed)
*  STAT* If patient is at the pharmacy, call can be transferred to refill team.   1. Which medications need to be refilled? (please list name of each medication and dose if known)  potassium and altace    2. Which pharmacy/location (including street and city if local pharmacy) is medication to be sent to? [facility] [address]   3. Do they need a 30 day or 90 day supply? 90

## 2020-10-23 NOTE — Telephone Encounter (Signed)
Contact pt to verify Altace not on pt's medication list.

## 2020-10-23 NOTE — Telephone Encounter (Signed)
Requested Prescriptions   Signed Prescriptions Disp Refills   potassium chloride SA (KLOR-CON) 20 MEQ tablet 180 tablet 1    Sig: Take 1 tablet (20 mEq total) by mouth daily as needed (Take only when take Torsemide as needed).    Authorizing Provider: END, CHRISTOPHER    Ordering User: Kendrick Fries

## 2020-10-26 NOTE — Telephone Encounter (Signed)
lmovm

## 2020-11-09 ENCOUNTER — Other Ambulatory Visit: Payer: Self-pay | Admitting: Internal Medicine

## 2020-11-18 ENCOUNTER — Institutional Professional Consult (permissible substitution): Payer: No Typology Code available for payment source | Admitting: Cardiology

## 2020-12-15 ENCOUNTER — Other Ambulatory Visit: Payer: Self-pay

## 2020-12-15 MED ORDER — POTASSIUM CHLORIDE CRYS ER 20 MEQ PO TBCR: 20.0000 meq | EXTENDED_RELEASE_TABLET | Freq: Every day | ORAL | 0 refills | Status: DC | PRN

## 2020-12-15 NOTE — Telephone Encounter (Signed)
*  STAT* If patient is at the pharmacy, call can be transferred to refill team.   1. Which medications need to be refilled? (please list name of each medication and dose if known)  potassium  2. Which pharmacy/location (including street and city if local pharmacy) is medication to be sent to? [facility] [address]  3. Do they need a 30 day or 90 day supply? 90

## 2020-12-17 ENCOUNTER — Other Ambulatory Visit: Payer: Self-pay | Admitting: Internal Medicine

## 2020-12-29 ENCOUNTER — Other Ambulatory Visit: Payer: Self-pay | Admitting: Internal Medicine

## 2021-01-20 ENCOUNTER — Ambulatory Visit: Payer: No Typology Code available for payment source | Admitting: Internal Medicine

## 2021-01-20 NOTE — Progress Notes (Deleted)
Follow-up Outpatient Visit Date: 01/20/2021  Primary Care Provider: Patient, [name] (Inactive) No address on file  Chief Complaint: Follow-up heart failure and atrial fibrillation  HPI:  Kenneth Ferguson is a 55 y.o. male with history of chronic HFrEF with normalization of LVEF, persistent atrial fibrillation complicated by left atrial appendage thrombus, hypertrophic cardiomyopathy, and morbid obesity, who presents for follow-up of heart failure and atrial fibrillation.  I last saw him in September, at which time Kenneth Ferguson was feeling very well.  He remained in sinus rhythm with incomplete left bundle branch block and mildly prolonged QTC.  He was euvolemic.  I referred him back to electrophysiology in order to discuss alternative rhythm control strategies in place of amiodarone.  We also agreed to stop spironolactone and switch torsemide to as needed.  Kenneth Ferguson did not move forward with the EP consultation.  --------------------------------------------------------------------------------------------------  Past Medical History:  Diagnosis Date   HFrEF (heart failure with reduced ejection fraction) (HCC)    a. 08/2018 Echo: EF 40-45% in setting of Afib; b. 11/2018 Echo: EF 25-30% w/ sev ant, antsept, and apical HK in setting of Afib w/ RVR; b. 08/2019 Echo: EF 60-65%.   HOCM (hypertrophic obstructive cardiomyopathy) (HCC)    a. Prev dx in early 2000s?; b. 08/2019 Echo: EF 60-65%, sev asym septal hypertrophy w/ septal wall of 78mm. SAM. LVOT grad of @ rest, w/ valsalva. Gr2 DD. Nl RV fxn. Mod dil LA. Mild MR.   Morbid obesity (HCC)    Persistent atrial fibrillation (HCC)    a. 11/2018 s/p TEE/DCCV. Maintained on Amio/Eliquis (CHA2DS2VASc =1).   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 08/2018 TEE: LAA thrombus.   Past Surgical History:  Procedure Laterality Date   CARDIOVERSION N/A 08/08/2018   Procedure: CARDIOVERSION;  Surgeon:  Antonieta Iba, MD;  Location: [facility];  Service: Cardiovascular;  Laterality: N/A;   CARDIOVERSION N/A 11/20/2018   Procedure: CARDIOVERSION;  Surgeon: Yvonne Kendall, MD;  Location: [facility];  Service: Cardiovascular;  Laterality: N/A;   CARDIOVERSION N/A 11/20/2018   Procedure: CARDIOVERSION;  Surgeon: Yvonne Kendall, MD;  Location: [facility];  Service: Cardiovascular;  Laterality: N/A;   CARDIOVERSION N/A 12/07/2018   Procedure: CARDIOVERSION;  Surgeon: Antonieta Iba, MD;  Location: [facility];  Service: Cardiovascular;  Laterality: N/A;   TEE WITHOUT CARDIOVERSION N/A 08/08/2018   Procedure: TRANSESOPHAGEAL ECHOCARDIOGRAM (TEE);  Surgeon: Antonieta Iba, MD;  Location: [facility];  Service: Cardiovascular;  Laterality: N/A;   TEE WITHOUT CARDIOVERSION N/A 11/20/2018   Procedure: TRANSESOPHAGEAL ECHOCARDIOGRAM (TEE);  Surgeon: Yvonne Kendall, MD;  Location: [facility];  Service: Cardiovascular;  Laterality: N/A;     No outpatient medications have been marked as taking for the 01/20/21 encounter (Appointment) with Daekwon Beswick, Cristal Deer, MD.    Allergies: Patient has no known allergies.  Social History   Tobacco Use   Smoking status: Never   Smokeless tobacco: Never  Vaping Use   Vaping Use: Never used  Substance Use Topics   Alcohol use: Never   Drug use: Never    Family History  Problem Relation Age of Onset   Emphysema Mother    Cancer Father     Review of Systems: A 12-system review of systems was performed and was negative except as noted in the HPI.  --------------------------------------------------------------------------------------------------  Physical Exam: There were no vitals taken for this visit.  General:  NAD. Neck: No JVD or HJR. Lungs: Clear to  auscultation bilaterally without wheezes or crackles. Heart: Regular rate and rhythm without murmurs, rubs, or gallops. Abdomen: Soft, nontender, nondistended. Extremities: No lower extremity  edema.  EKG:  ***  Lab Results  Component Value Date   WBC 6.3 10/16/2020   HGB 13.2 10/16/2020   HCT 38.7 10/16/2020   MCV 90 10/16/2020   PLT 265 10/16/2020    Lab Results  Component Value Date   NA 137 10/16/2020   K 3.5 10/16/2020   CL 98 10/16/2020   CO2 23 10/16/2020   BUN 17 10/16/2020   CREATININE 1.34 (H) 10/16/2020   GLUCOSE 78 10/16/2020   ALT 17 10/16/2020    No results found for: CHOL, HDL, LDLCALC, LDLDIRECT, TRIG, CHOLHDL  --------------------------------------------------------------------------------------------------  ASSESSMENT AND PLAN: Chronic HFrEF with recovered ejection fraction and hypertrophic cardiomyopathy: ***  Persistent atrial fibrillatoin: ***  Morbid obesity: ***  Follow-up: ***  Nelva Bush, MD 01/20/2021 1:04 PM

## 2021-01-21 ENCOUNTER — Telehealth: Payer: Self-pay | Admitting: *Deleted

## 2021-01-21 ENCOUNTER — Encounter: Payer: Self-pay | Admitting: Internal Medicine

## 2021-01-21 NOTE — Telephone Encounter (Signed)
Pt has no-showed or cancelled for multiple office visits including EP consultation.  This encounter created to document that pt is not to have Amiodarone refilled until after he comes in for follow up per Dr. Okey Dupre.  Also noted on Rx sig no further refills for appropriate management purposes.

## 2021-02-16 ENCOUNTER — Other Ambulatory Visit: Payer: Self-pay | Admitting: Internal Medicine

## 2021-02-16 NOTE — Telephone Encounter (Signed)
Please reschedule F/U appointment with Dr. Okey Dupre. Patient did not show. Thank you!

## 2021-02-16 NOTE — Telephone Encounter (Signed)
Scheduled

## 2021-03-14 ENCOUNTER — Other Ambulatory Visit: Payer: Self-pay | Admitting: Internal Medicine

## 2021-03-17 ENCOUNTER — Institutional Professional Consult (permissible substitution): Payer: No Typology Code available for payment source | Admitting: Cardiology

## 2021-03-20 ENCOUNTER — Other Ambulatory Visit: Payer: Self-pay | Admitting: Internal Medicine

## 2021-03-24 ENCOUNTER — Other Ambulatory Visit: Payer: Self-pay

## 2021-03-24 MED ORDER — POTASSIUM CHLORIDE CRYS ER 20 MEQ PO TBCR: 20.0000 meq | EXTENDED_RELEASE_TABLET | Freq: Every day | ORAL | 0 refills | Status: DC | PRN

## 2021-03-24 NOTE — Telephone Encounter (Signed)
*  STAT* If patient is at the pharmacy, call can be transferred to refill team.   1. Which medications need to be refilled? (please list name of each medication and dose if known) Potassium  2. Which pharmacy/location (including street and city if local pharmacy) is medication to be sent to? [place]  3. Do they need a 30 day or 90 day supply? [place]

## 2021-04-14 ENCOUNTER — Institutional Professional Consult (permissible substitution): Payer: No Typology Code available for payment source | Admitting: Cardiology

## 2021-05-13 ENCOUNTER — Ambulatory Visit: Payer: No Typology Code available for payment source | Admitting: Internal Medicine

## 2021-05-13 NOTE — Progress Notes (Deleted)
? ?Follow-up Outpatient Visit ?Date: 05/13/2021 ? ?Primary Care Provider: ?Patient, [name] (Inactive) ?No address on file ? ?Chief Complaint: *** ? ?HPI:  Kenneth Ferguson is a 56 y.o. male with history of chronic HFrEF with normalization of LVEF, persistent atrial fibrillation complicated by left atrial appendage thrombus, hypertrophic cardiomyopathy, and morbid obesity, who presents for follow-up of cardiomyopathy and atrial fibrillation.  I last saw him in 10/2020, as he has canceled or no showed for his follow-up visits with me as well as EP consultation with Dr. Lalla Brothers.  I had referred him to Dr. Lalla Brothers to assist with long-term antiarrhythmic therapy of his persistent atrial fibrillation, as I feel that long-term amiodarone therapy is not a good option for Kenneth Ferguson given his young age. ? ?-------------------------------------------------------------------------------------------------- ? ?Past Medical History:  ?Diagnosis Date  ? HFrEF (heart failure with reduced ejection fraction) (HCC)   ? a. 08/2018 Echo: EF 40-45% in setting of Afib; b. 11/2018 Echo: EF 25-30% w/ sev ant, antsept, and apical HK in setting of Afib w/ RVR; b. 08/2019 Echo: EF 60-65%.  ? HOCM (hypertrophic obstructive cardiomyopathy) (HCC)   ? a. Prev dx in early 2000s?; b. 08/2019 Echo: EF 60-65%, sev asym septal hypertrophy w/ septal wall of 45mm. SAM. LVOT grad of @ rest, w/ valsalva. Gr2 DD. Nl RV fxn. Mod dil LA. Mild MR.  ? Morbid obesity (HCC)   ? Persistent atrial fibrillation (HCC)   ? a. 11/2018 s/p TEE/DCCV. Maintained on Amio/Eliquis (CHA2DS2VASc =1).  ? 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 a. 08/2018 TEE: LAA thrombus.  ? ?Past Surgical History:  ?Procedure Laterality Date  ? CARDIOVERSION N/A 08/08/2018  ? Procedure: CARDIOVERSION;  Surgeon: Antonieta Iba, MD;  Location: [facility];  Service: Cardiovascular;  Laterality: N/A;  ? CARDIOVERSION N/A 11/20/2018  ? Procedure:  CARDIOVERSION;  Surgeon: Yvonne Kendall, MD;  Location: [facility];  Service: Cardiovascular;  Laterality: N/A;  ? CARDIOVERSION N/A 11/20/2018  ? Procedure: CARDIOVERSION;  Surgeon: Yvonne Kendall, MD;  Location: [facility];  Service: Cardiovascular;  Laterality: N/A;  ? CARDIOVERSION N/A 12/07/2018  ? Procedure: CARDIOVERSION;  Surgeon: Antonieta Iba, MD;  Location: [facility];  Service: Cardiovascular;  Laterality: N/A;  ? TEE WITHOUT CARDIOVERSION N/A 08/08/2018  ? Procedure: TRANSESOPHAGEAL ECHOCARDIOGRAM (TEE);  Surgeon: Antonieta Iba, MD;  Location: [facility];  Service: Cardiovascular;  Laterality: N/A;  ? TEE WITHOUT CARDIOVERSION N/A 11/20/2018  ? Procedure: TRANSESOPHAGEAL ECHOCARDIOGRAM (TEE);  Surgeon: Yvonne Kendall, MD;  Location: [facility];  Service: Cardiovascular;  Laterality: N/A;  ? ? ?No outpatient medications have been marked as taking for the 05/13/21 encounter (Appointment) with Kathlee Barnhardt, Cristal Deer, MD.  ? ? ?Allergies: Patient has no known allergies. ? ?Social History  ? ?Tobacco Use  ? Smoking status: Never  ? Smokeless tobacco: Never  ?Vaping Use  ? Vaping Use: Never used  ?Substance Use Topics  ? Alcohol use: Never  ? Drug use: Never  ? ? ?Family History  ?Problem Relation Age of Onset  ? Emphysema Mother   ? Cancer Father   ? ? ?Review of Systems: ?A 12-system review of systems was performed and was negative except as noted in the HPI. ? ?-------------------------------------------------------------------------------------------------- ? ?Physical Exam: ?There were no vitals taken for this visit. ? ?General:  NAD. ?Neck: No JVD or HJR. ?Lungs: Clear to auscultation bilaterally without wheezes or crackles. ?Heart: Regular rate and rhythm without murmurs, rubs, or gallops. ?Abdomen: Soft, nontender, nondistended. ?  Extremities: No lower extremity edema. ? ?EKG:  *** ? ?Lab Results  ?Component Value Date  ? WBC 6.3 10/16/2020  ? HGB 13.2 10/16/2020  ? HCT 38.7 10/16/2020  ? MCV 90 10/16/2020   ? PLT 265 10/16/2020  ? ? ?Lab Results  ?Component Value Date  ? NA 137 10/16/2020  ? K 3.5 10/16/2020  ? CL 98 10/16/2020  ? CO2 23 10/16/2020  ? BUN 17 10/16/2020  ? CREATININE 1.34 (H) 10/16/2020  ? GLUCOSE 78 10/16/2020  ? ALT 17 10/16/2020  ? ? ?No results found for: CHOL, HDL, LDLCALC, LDLDIRECT, TRIG, CHOLHDL ? ?-------------------------------------------------------------------------------------------------- ? ?ASSESSMENT AND PLAN: ?*** ? ?Yvonne Kendall, MD ?05/13/2021 ?1:02 PM ? ?

## 2021-05-17 ENCOUNTER — Other Ambulatory Visit: Payer: Self-pay | Admitting: Internal Medicine

## 2021-05-26 ENCOUNTER — Institutional Professional Consult (permissible substitution): Payer: No Typology Code available for payment source | Admitting: Cardiology

## 2021-05-26 NOTE — Progress Notes (Deleted)
Electrophysiology Office Note:    Date:  05/26/2021   ID:  [name], DOB [birth year], MRN [id number]  PCP:  Patient, [name] (Inactive)  [facility] Cardiologist:  Yvonne Kendall, MD  [facility] Electrophysiologist:  Lanier Prude, MD   Referring MD: Yvonne Kendall, MD   Chief Complaint: AF  History of Present Illness:    Kenneth Ferguson is a 56 y.o. male who presents for an evaluation of AF at the request of Dr End. Their medical history includes chronic systolic heart failure, HOCM, morbid obesity, LAA thrombus in 2020.   He was last seen by Dr End 10/16/2020. I was previously scheduled to see him in 03/2020 but he no-showed the appointment.   His EF has fluctuated in the past with a low of 25% and high of 65% in 2021. His AF is thought to be contributing. He has maintained rhythm on amiodarone but is seeking a rhythm control strategy that avoids exposure to antiarrhythmic drugs if possible.      Past Medical History:  Diagnosis Date   HFrEF (heart failure with reduced ejection fraction) (HCC)    a. 08/2018 Echo: EF 40-45% in setting of Afib; b. 11/2018 Echo: EF 25-30% w/ sev ant, antsept, and apical HK in setting of Afib w/ RVR; b. 08/2019 Echo: EF 60-65%.   HOCM (hypertrophic obstructive cardiomyopathy) (HCC)    a. Prev dx in early 2000s?; b. 08/2019 Echo: EF 60-65%, sev asym septal hypertrophy w/ septal wall of 26mm. SAM. LVOT grad of @ rest, w/ valsalva. Gr2 DD. Nl RV fxn. Mod dil LA. Mild MR.   Morbid obesity (HCC)    Persistent atrial fibrillation (HCC)    a. 11/2018 s/p TEE/DCCV. Maintained on Amio/Eliquis (CHA2DS2VASc =1).   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 08/2018 TEE: LAA thrombus.    Past Surgical History:  Procedure Laterality Date   CARDIOVERSION N/A 08/08/2018   Procedure: CARDIOVERSION;  Surgeon: Antonieta Iba, MD;  Location: [facility];  Service: Cardiovascular;  Laterality: N/A;    CARDIOVERSION N/A 11/20/2018   Procedure: CARDIOVERSION;  Surgeon: Yvonne Kendall, MD;  Location: [facility];  Service: Cardiovascular;  Laterality: N/A;   CARDIOVERSION N/A 11/20/2018   Procedure: CARDIOVERSION;  Surgeon: Yvonne Kendall, MD;  Location: [facility];  Service: Cardiovascular;  Laterality: N/A;   CARDIOVERSION N/A 12/07/2018   Procedure: CARDIOVERSION;  Surgeon: Antonieta Iba, MD;  Location: [facility];  Service: Cardiovascular;  Laterality: N/A;   TEE WITHOUT CARDIOVERSION N/A 08/08/2018   Procedure: TRANSESOPHAGEAL ECHOCARDIOGRAM (TEE);  Surgeon: Antonieta Iba, MD;  Location: [facility];  Service: Cardiovascular;  Laterality: N/A;   TEE WITHOUT CARDIOVERSION N/A 11/20/2018   Procedure: TRANSESOPHAGEAL ECHOCARDIOGRAM (TEE);  Surgeon: Yvonne Kendall, MD;  Location: [facility];  Service: Cardiovascular;  Laterality: N/A;    Current Medications: No outpatient medications have been marked as taking for the 05/26/21 encounter (Appointment) with Lanier Prude, MD.     Allergies:   Patient has no known allergies.   Social History   Socioeconomic History   Marital status: Widowed    Spouse name: Not on file   Number of children: Not on file   Years of education: Not on file   Highest education level: Not on file  Occupational History   Not on file  Tobacco Use   Smoking status: Never   Smokeless tobacco: Never  Vaping Use   Vaping Use: Never used  Substance and Sexual  Activity   Alcohol use: Never   Drug use: Never   Sexual activity: Not on file  Other Topics Concern   Not on file  Social History Narrative   Not on file   Social Determinants of Health   Financial Resource Strain: Not on file  Food Insecurity: Not on file  Transportation Needs: Not on file  Physical Activity: Not on file  Stress: Not on file  Social Connections: Not on file     Family History: The patient's family history includes Cancer in his father; Emphysema in his mother.  ROS:    Please see the history of present illness.    All other systems reviewed and are negative.  EKGs/Labs/Other Studies Reviewed:    The following studies were reviewed today:  08/08/2019 Echo personally reviewed  1. Patient has severe asymmetric septal hypertrophy with septal wall  measuring up to 58mm. There is systolic anterior motion (SAM) of mitral  valve causing LVOT obstruction with posteriorly directed mitral  regurgitation. LVOT gradient is at rest  increasing to with valvsalva. Recommend Cardiac MR for further  assessment as findings are consistent with HCM.Marland Kitchen Left ventricular ejection  fraction, by estimation, is 60 to 65%. The left ventricle has normal  function. The left ventricle has no  regional wall motion abnormalities. There is severe asymmetric left  ventricular hypertrophy of the septal segment. Left ventricular diastolic  parameters are consistent with Grade II diastolic dysfunction  (pseudonormalization).   2. Right ventricular systolic function is normal. The right ventricular  size is mildly enlarged. There is moderately elevated pulmonary artery  systolic pressure.   3. Left atrial size was moderately dilated.   4. The mitral valve is grossly normal. Mild mitral valve regurgitation.   5. The aortic valve is tricuspid. Aortic valve regurgitation is not  visualized.    11/30/2018 ECG shows AF w/ RVR 10/10/2018 ECG shows AF w/ RVR    EKG:  The ekg ordered today demonstrates ***   Recent Labs: 10/16/2020: ALT 17; BUN 17; Creatinine, Ser 1.34; Hemoglobin 13.2; Platelets 265; Potassium 3.5; Sodium 137; TSH 2.290  Recent Lipid Panel No results found for: CHOL, TRIG, HDL, CHOLHDL, VLDL, LDLCALC, LDLDIRECT  Physical Exam:    VS:  There were no vitals taken for this visit.    Wt Readings from Last 3 Encounters:  10/16/20 289 lb (131.1 kg)  11/18/19 294 lb 3.2 oz (133.4 kg)  08/15/19 287 lb (130.2 kg)     GEN: *** Well nourished, well developed  in no acute distress HEENT: Normal NECK: No JVD; No carotid bruits LYMPHATICS: No lymphadenopathy CARDIAC: ***RRR, no murmurs, rubs, gallops RESPIRATORY:  Clear to auscultation without rales, wheezing or rhonchi  ABDOMEN: Soft, non-tender, non-distended MUSCULOSKELETAL:  No edema; No deformity  SKIN: Warm and dry NEUROLOGIC:  Alert and oriented x 3 PSYCHIATRIC:  Normal affect       ASSESSMENT:    1. Persistent atrial fibrillation (HCC)   2. Hypertrophic cardiomyopathy (HCC)   3. Chronic HFrEF (heart failure with reduced ejection fraction) (HCC)    PLAN:    In order of problems listed above:  #Persistent AF Maintaining rhythm on amiodarone. On eliquis for stroke ppx.  Discussed treatment options today for his AF including antiarrhythmic drug therapy and ablation. Discussed risks, recovery and likelihood of success. Discussed potential need for repeat ablation procedures and antiarrhythmic drugs after an initial ablation. They wish to proceed with scheduling.  Risk, benefits, and alternatives to EP study and  radiofrequency ablation for afib were also discussed in detail today. These risks include but are not limited to stroke, bleeding, vascular damage, tamponade, perforation, damage to the esophagus, lungs, and other structures, pulmonary vein stenosis, worsening renal function, and death. The patient understands these risk and wishes to proceed.  We will therefore proceed with catheter ablation at the next available time.  Carto, ICE, anesthesia are requested for the procedure.  Will also obtain CT PV protocol prior to the procedure to exclude LAA thrombus and further evaluate atrial anatomy.   #HOCM SAM. LV thickness up to 72mm  - needs zio monitor to assess for NSVT/VT - cardiac MRI - needs genetics evaluation   #Chronic systolic heart failure NYHA I. Warm and dry on exam.    Total time spent with patient today *** minutes. This includes reviewing records,  evaluating the patient and coordinating care.  Medication Adjustments/Labs and Tests Ordered: Current medicines are reviewed at length with the patient today.  Concerns regarding medicines are outlined above.  No orders of the defined types were placed in this encounter.  No orders of the defined types were placed in this encounter.    Signed, Rossie Muskrat. Lalla Brothers, MD, [facility], [facility] 05/26/2021 6:13 AM    Electrophysiology [facility]

## 2021-05-27 ENCOUNTER — Encounter: Payer: Self-pay | Admitting: Cardiovascular Disease

## 2021-05-27 ENCOUNTER — Encounter: Payer: Self-pay | Admitting: Cardiology

## 2021-06-15 ENCOUNTER — Other Ambulatory Visit: Payer: Self-pay | Admitting: Internal Medicine

## 2021-06-16 ENCOUNTER — Encounter: Payer: Self-pay | Admitting: *Deleted

## 2021-06-16 ENCOUNTER — Other Ambulatory Visit: Payer: Self-pay | Admitting: Internal Medicine

## 2021-06-16 ENCOUNTER — Ambulatory Visit: Payer: No Typology Code available for payment source | Admitting: Internal Medicine

## 2021-06-16 NOTE — Progress Notes (Deleted)
Follow-up Outpatient Visit Date: 06/16/2021  Primary Care Provider: Patient, [name] (Inactive) No address on file  Chief Complaint:   HPI:  Mr. Somerville is a 56 y.o. male with history of chronic HFrEF with normalization of LVEF, persistent atrial fibrillation complicated by left atrial appendage thrombus, hypertrophic cardiomyopathy, and morbid obesity, who presents for follow-up of heart failure and atrial fibrillation.  I last saw him in 10/2020, with Mr. Mccarey having been noncompliant with subsequent follow-up on numerous occasions.  At that time, he was referred to EP for discussion of long-term rhythm control in an attempt to get him off amiodarone.  He did not show for his consultation with Dr. Lalla Brothers.  --------------------------------------------------------------------------------------------------  Past Medical History:  Diagnosis Date   HFrEF (heart failure with reduced ejection fraction) (HCC)    a. 08/2018 Echo: EF 40-45% in setting of Afib; b. 11/2018 Echo: EF 25-30% w/ sev ant, antsept, and apical HK in setting of Afib w/ RVR; b. 08/2019 Echo: EF 60-65%.   HOCM (hypertrophic obstructive cardiomyopathy) (HCC)    a. Prev dx in early 2000s?; b. 08/2019 Echo: EF 60-65%, sev asym septal hypertrophy w/ septal wall of 72mm. SAM. LVOT grad of @ rest, w/ valsalva. Gr2 DD. Nl RV fxn. Mod dil LA. Mild MR.   Morbid obesity (HCC)    Persistent atrial fibrillation (HCC)    a. 11/2018 s/p TEE/DCCV. Maintained on Amio/Eliquis (CHA2DS2VASc =1).   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 08/2018 TEE: LAA thrombus.   Past Surgical History:  Procedure Laterality Date   CARDIOVERSION N/A 08/08/2018   Procedure: CARDIOVERSION;  Surgeon: Antonieta Iba, MD;  Location: [facility];  Service: Cardiovascular;  Laterality: N/A;   CARDIOVERSION N/A 11/20/2018   Procedure: CARDIOVERSION;  Surgeon: Yvonne Kendall, MD;  Location: [facility];   Service: Cardiovascular;  Laterality: N/A;   CARDIOVERSION N/A 11/20/2018   Procedure: CARDIOVERSION;  Surgeon: Yvonne Kendall, MD;  Location: [facility];  Service: Cardiovascular;  Laterality: N/A;   CARDIOVERSION N/A 12/07/2018   Procedure: CARDIOVERSION;  Surgeon: Antonieta Iba, MD;  Location: [facility];  Service: Cardiovascular;  Laterality: N/A;   TEE WITHOUT CARDIOVERSION N/A 08/08/2018   Procedure: TRANSESOPHAGEAL ECHOCARDIOGRAM (TEE);  Surgeon: Antonieta Iba, MD;  Location: [facility];  Service: Cardiovascular;  Laterality: N/A;   TEE WITHOUT CARDIOVERSION N/A 11/20/2018   Procedure: TRANSESOPHAGEAL ECHOCARDIOGRAM (TEE);  Surgeon: Yvonne Kendall, MD;  Location: [facility];  Service: Cardiovascular;  Laterality: N/A;    No outpatient medications have been marked as taking for the 06/16/21 encounter (Appointment) with Orvan Papadakis, Cristal Deer, MD.    Allergies: Patient has no known allergies.  Social History   Tobacco Use   Smoking status: Never   Smokeless tobacco: Never  Vaping Use   Vaping Use: Never used  Substance Use Topics   Alcohol use: Never   Drug use: Never    Family History  Problem Relation Age of Onset   Emphysema Mother    Cancer Father     Review of Systems: A 12-system review of systems was performed and was negative except as noted in the HPI.  --------------------------------------------------------------------------------------------------  Physical Exam: There were no vitals taken for this visit.  General:  NAD. Neck: No JVD or HJR. Lungs: Clear to auscultation bilaterally without wheezes or crackles. Heart: Regular rate and rhythm without murmurs, rubs, or gallops. Abdomen: Soft, nontender, nondistended. Extremities: No lower extremity edema.  EKG:  ***  Lab Results  Component Value Date   WBC 6.3 10/16/2020   HGB 13.2 10/16/2020   HCT 38.7 10/16/2020   MCV 90 10/16/2020   PLT 265 10/16/2020    Lab Results  Component Value Date   NA  137 10/16/2020   K 3.5 10/16/2020   CL 98 10/16/2020   CO2 23 10/16/2020   BUN 17 10/16/2020   CREATININE 1.34 (H) 10/16/2020   GLUCOSE 78 10/16/2020   ALT 17 10/16/2020    No results found for: CHOL, HDL, LDLCALC, LDLDIRECT, TRIG, CHOLHDL  --------------------------------------------------------------------------------------------------  ASSESSMENT AND PLAN: ***  Yvonne Kendall, MD 06/16/2021 7:09 AM

## 2021-06-17 ENCOUNTER — Encounter: Payer: Self-pay | Admitting: Internal Medicine

## 2021-06-17 ENCOUNTER — Other Ambulatory Visit: Payer: Self-pay | Admitting: Internal Medicine

## 2021-06-17 ENCOUNTER — Telehealth: Payer: Self-pay | Admitting: Internal Medicine

## 2021-06-17 NOTE — Telephone Encounter (Signed)
Pt has been discharged from this practice as of yesterday 06/16/21. Letter has been mailed to pt.  ?

## 2021-06-17 NOTE — Telephone Encounter (Signed)
Patient dismissed from [facility] by Dr. Guss Bunde 06/16/21. Dismissal letter sent out by 1st class mail 5/11/23fbg ?

## 2021-06-17 NOTE — Telephone Encounter (Signed)
New Message: ? ? ?Patient said he was not  notified yesterday that he was dismissed from the practice when he made his appointment. He says he is begging to be reinstated, because he must have his medicine. He says he needs his medicine to live please.He said  he promise from now on, if he have to miss an appointment because of his work, he will notify the office. He said please let him know something today, ?

## 2021-06-18 NOTE — Telephone Encounter (Signed)
Routing to Lanny Hurst, RN for Dr. Okey Dupre & [place], Manager as I am unfamiliar with this patient or his situation.  ? ?

## 2021-06-18 NOTE — Telephone Encounter (Signed)
Patient is calling back because no one called him back on yesterday. Please adivse ?

## 2021-06-21 NOTE — Telephone Encounter (Signed)
Patient is calling back for update. Don't understand why he haven't gotten a call back. Patient also states he has a week left of medication and from what Dr. Okey Dupre told him he cant not be without this medication. Please advise  ?

## 2021-06-21 NOTE — Telephone Encounter (Signed)
Spoke with patient and Dr. Okey Dupre.  Will give patient one chance to come to appointment on May 18th at 1:40.  If patient cancels appointment or no-shows patient will be dismissed permanently from the practice.  ?

## 2021-06-24 ENCOUNTER — Other Ambulatory Visit
Admission: RE | Admit: 2021-06-24 | Discharge: 2021-06-24 | Disposition: A | Discharge status: Home / Self Care | Payer: PRIVATE HEALTH INSURANCE | Attending: Internal Medicine | Admitting: Internal Medicine

## 2021-06-24 ENCOUNTER — Encounter: Payer: Self-pay | Admitting: Internal Medicine

## 2021-06-24 ENCOUNTER — Ambulatory Visit (INDEPENDENT_AMBULATORY_CARE_PROVIDER_SITE_OTHER): Payer: No Typology Code available for payment source | Admitting: Internal Medicine

## 2021-06-24 VITALS — BP 116/80 | HR 80 | Ht 68.5 in | Wt 288.0 lb

## 2021-06-24 DIAGNOSIS — I5022 Chronic systolic (congestive) heart failure: Secondary | ICD-10-CM | POA: Insufficient documentation

## 2021-06-24 DIAGNOSIS — Z79899 Other long term (current) drug therapy: Secondary | ICD-10-CM | POA: Diagnosis present

## 2021-06-24 DIAGNOSIS — I4819 Other persistent atrial fibrillation: Secondary | ICD-10-CM

## 2021-06-24 DIAGNOSIS — I422 Other hypertrophic cardiomyopathy: Secondary | ICD-10-CM

## 2021-06-24 LAB — COMPREHENSIVE METABOLIC PANEL
ALT: 19 U/L (ref 0–44)
AST: 22 U/L (ref 15–41)
Albumin: 4.1 g/dL (ref 3.5–5.0)
Alkaline Phosphatase: 77 U/L (ref 38–126)
Anion gap: 9 (ref 5–15)
BUN: 18 mg/dL (ref 6–20)
CO2: 26 mmol/L (ref 22–32)
Calcium: 8.8 mg/dL — ABNORMAL LOW (ref 8.9–10.3)
Chloride: 101 mmol/L (ref 98–111)
Creatinine, Ser: 1.4 mg/dL — ABNORMAL HIGH (ref 0.61–1.24)
GFR, Estimated: 59 mL/min — ABNORMAL LOW (ref >60)
Glucose, Bld: 101 mg/dL — ABNORMAL HIGH (ref 70–99)
Potassium: 3.3 mmol/L — ABNORMAL LOW (ref 3.5–5.1)
Sodium: 136 mmol/L (ref 135–145)
Total Bilirubin: 0.5 mg/dL (ref 0.3–1.2)
Total Protein: 7.7 g/dL (ref 6.5–8.1)

## 2021-06-24 LAB — CBC
HCT: 41.3 % (ref 39.0–52.0)
Hemoglobin: 13.8 g/dL (ref 13.0–17.0)
MCH: 29.2 pg (ref 26.0–34.0)
MCHC: 33.4 g/dL (ref 30.0–36.0)
MCV: 87.5 fL (ref 80.0–100.0)
Platelets: 240 10*3/uL (ref 150–400)
RBC: 4.72 MIL/uL (ref 4.22–5.81)
RDW: 14.1 % (ref 11.5–15.5)
WBC: 7.6 10*3/uL (ref 4.0–10.5)
nRBC: 0 % (ref 0.0–0.2)

## 2021-06-24 LAB — LIPID PANEL
Cholesterol: 237 mg/dL — ABNORMAL HIGH (ref 0–200)
HDL: 38 mg/dL — ABNORMAL LOW (ref >40)
LDL Cholesterol: 158 mg/dL — ABNORMAL HIGH (ref 0–99)
Total CHOL/HDL Ratio: 6.2 RATIO
Triglycerides: 203 mg/dL — ABNORMAL HIGH (ref <150)
VLDL: 41 mg/dL — ABNORMAL HIGH (ref 0–40)

## 2021-06-24 MED ORDER — TORSEMIDE 20 MG PO TABS: 20.0000 mg | ORAL_TABLET | Freq: Two times a day (BID) | ORAL | Status: DC

## 2021-06-24 MED ORDER — POTASSIUM CHLORIDE CRYS ER 20 MEQ PO TBCR
20.0000 meq | EXTENDED_RELEASE_TABLET | Freq: Two times a day (BID) | ORAL | Status: DC
Start: 2021-06-24 — End: 2021-06-25

## 2021-06-24 NOTE — Patient Instructions (Signed)
Medication Instructions:   Your physician has recommended you make the following change in your medication:   STOP Amiodarone  CONTINUE Eliquis 5 mg TWICE daily and Losartan 12.5 mg daily   CONTINUE Torsemide 20 mg TWICE daily and Potassium 20 mEq TWICE daily   *If you need a refill on your cardiac medications before your next appointment, please call your pharmacy*   Lab Work:  Today at the medical mall: CMET, CBC, Lipid panel  -  Please go to the [facility].  -  You will check in at the front desk to the right as you walk into the atrium.   If you have labs (blood work) drawn today and your tests are completely normal, you will receive your results only by: MyChart Message (if you have MyChart) OR A paper copy in the mail If you have any lab test that is abnormal or we need to change your treatment, we will call you to review the results.   Testing/Procedures:  None ordered   Follow-Up: At [facility], you and your health needs are our priority.  As part of our continuing mission to provide you with exceptional heart care, we have created designated Provider Care Teams.  These Care Teams include your primary Cardiologist (physician) and Advanced Practice Providers (APPs -  Physician Assistants and Nurse Practitioners) who all work together to provide you with the care you need, when you need it.  We recommend signing up for the patient portal called "MyChart".  Sign up information is provided on this After Visit Summary.  MyChart is used to connect with patients for Virtual Visits (Telemedicine).  Patients are able to view lab/test results, encounter notes, upcoming appointments, etc.  Non-urgent messages can be sent to your provider as well.   To learn more about what you can do with MyChart, go to [email or web address].    Your next appointment:   3 month(s)  The format for your next appointment:   In Person  Provider:    You may see Yvonne Kendall, MD     Important Information About Sugar

## 2021-06-24 NOTE — Progress Notes (Signed)
Follow-up Outpatient Visit Date: 06/24/2021  Primary Care Provider: Patient, [name] (Inactive) No address on file  Chief Complaint: Follow-up atrial fibrillation and heart failure  HPI:  Mr. Janes is a 56 y.o. male with history of chronic HFrEF with normalization of LVEF, persistent atrial fibrillation complicated by left atrial appendage thrombus, hypertrophic cardiomyopathy, and morbid obesity, who presents for follow-up of heart failure and atrial fibrillation.  I last saw him in 10/2020, with Mr. Haverland having been noncompliant with subsequent follow-up on numerous occasions.  At that time, he was referred to EP for discussion of long-term rhythm control in an attempt to get him off amiodarone.  He did not show for his consultation with Dr. Lalla Brothers.  He has canceled or no showed 19 times with our practice since then.  Today, Mr. Harty presents with his son for follow-up.  He reports that he has been feeling well.  At our last visit, we had agreed to make torsemide as needed and discontinue spironolactone.  I recommended continuation of bisoprolol and losartan.  However, Mr. Zorn instead decided to stop bisoprolol and spironolactone while continuing twice daily torsemide and potassium chloride.  On further questioning, he thinks he "felt crappy" while on bisoprolol.  He denies chest pain, shortness of breath, palpitations, lightheadedness, and edema.  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  --------------------------------------------------------------------------------------------------  Past Medical History:  Diagnosis Date   HFrEF (heart failure with reduced ejection fraction) (HCC)    a. 08/2018 Echo: EF 40-45% in setting of Afib; b. 11/2018 Echo: EF 25-30% w/ sev ant, antsept, and apical HK in setting of Afib w/ RVR; b. 08/2019 Echo: EF 60-65%.   HOCM (hypertrophic obstructive cardiomyopathy) (HCC)    a. Prev dx in early 2000s?; b. 08/2019 Echo: EF 60-65%, sev asym  septal hypertrophy w/ septal wall of 57mm. SAM. LVOT grad of @ rest, w/ valsalva. Gr2 DD. Nl RV fxn. Mod dil LA. Mild MR.   Morbid obesity (HCC)    Persistent atrial fibrillation (HCC)    a. 11/2018 s/p TEE/DCCV. Maintained on Amio/Eliquis (CHA2DS2VASc =1).   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 08/2018 TEE: LAA thrombus.   Past Surgical History:  Procedure Laterality Date   CARDIOVERSION N/A 08/08/2018   Procedure: CARDIOVERSION;  Surgeon: Antonieta Iba, MD;  Location: [facility];  Service: Cardiovascular;  Laterality: N/A;   CARDIOVERSION N/A 11/20/2018   Procedure: CARDIOVERSION;  Surgeon: Yvonne Kendall, MD;  Location: [facility];  Service: Cardiovascular;  Laterality: N/A;   CARDIOVERSION N/A 11/20/2018   Procedure: CARDIOVERSION;  Surgeon: Yvonne Kendall, MD;  Location: [facility];  Service: Cardiovascular;  Laterality: N/A;   CARDIOVERSION N/A 12/07/2018   Procedure: CARDIOVERSION;  Surgeon: Antonieta Iba, MD;  Location: [facility];  Service: Cardiovascular;  Laterality: N/A;   TEE WITHOUT CARDIOVERSION N/A 08/08/2018   Procedure: TRANSESOPHAGEAL ECHOCARDIOGRAM (TEE);  Surgeon: Antonieta Iba, MD;  Location: [facility];  Service: Cardiovascular;  Laterality: N/A;   TEE WITHOUT CARDIOVERSION N/A 11/20/2018   Procedure: TRANSESOPHAGEAL ECHOCARDIOGRAM (TEE);  Surgeon: Yvonne Kendall, MD;  Location: [facility];  Service: Cardiovascular;  Laterality: N/A;    Current Meds  Medication Sig   amiodarone (PACERONE) 200 MG tablet TAKE 1 TABLET(200 MG) BY MOUTH DAILY   apixaban (ELIQUIS) 5 MG TABS tablet TAKE 1 TABLET(5 MG) BY MOUTH TWICE DAILY   losartan (COZAAR) 25 MG tablet TAKE 1/2 TABLET(12.5 MG) BY MOUTH DAILY  potassium chloride SA (KLOR-CON M) 20 MEQ tablet Take 1 tablet (20 mEq total) by mouth daily as needed (Take only when take Torsemide as needed).   torsemide (DEMADEX) 20 MG tablet Take 1 tablet (20 mg total) by mouth  daily as needed.    Allergies: Patient has no known allergies.  Social History   Tobacco Use   Smoking status: Never   Smokeless tobacco: Never  Vaping Use   Vaping Use: Never used  Substance Use Topics   Alcohol use: Never   Drug use: Never    Family History  Problem Relation Age of Onset   Emphysema Mother    Cancer Father     Review of Systems: A 12-system review of systems was performed and was negative except as noted in the HPI.  --------------------------------------------------------------------------------------------------  Physical Exam: BP 116/80 (BP Location: Left Arm, Patient Position: Sitting, Cuff Size: Large)   Pulse 80   Ht 5' 8.5" (1.74 m)   Wt 288 lb (130.6 kg)   SpO2 97%   BMI 43.15 kg/m   General:  NAD. Neck: Unable to assess JVP due to body habitus. Lungs: Clear to auscultation bilaterally without wheezes or crackles. Heart: Regular rate and rhythm without murmurs, rubs, or gallops. Abdomen: Soft, nontender, nondistended. Extremities: No lower extremity edema.  EKG: Normal sinus rhythm with PVCs and incomplete left bundle branch block.  Compared with prior tracing from 10/16/2020, PVCs are now present.  Lab Results  Component Value Date   WBC 6.3 10/16/2020   HGB 13.2 10/16/2020   HCT 38.7 10/16/2020   MCV 90 10/16/2020   PLT 265 10/16/2020    Lab Results  Component Value Date   NA 137 10/16/2020   K 3.5 10/16/2020   CL 98 10/16/2020   CO2 23 10/16/2020   BUN 17 10/16/2020   CREATININE 1.34 (H) 10/16/2020   GLUCOSE 78 10/16/2020   ALT 17 10/16/2020    --------------------------------------------------------------------------------------------------  ASSESSMENT AND PLAN: Chronic HFrEF with recovered ejection fraction and hypertrophic cardiomyopathy: Mr. Neeson reports NYHA class I heart failure symptoms and appears grossly euvolemic on examination today though his body habitus limits evaluation.  Unfortunately, he has not been  taking his prescribed GDMT, instead opting to discontinue bisoprolol while continuing standing diuretic therapy.  He says he did not feel very well while on bisoprolol.  We will therefore defer rechallenging him him with a beta-blocker today.  I will plan to continue current dose of losartan as well as standing torsemide and potassium chloride.  We will check a CMP today to ensure that his renal function and potassium are stable.  He may benefit from cardiac MRI for further assessment of his suspected hypertrophic cardiomyopathy.  However, we will readdress this at follow-up.  Persistent atrial fibrillation: Mr. Musick is maintaining sinus rhythm.  It was previously thought that his cardiomyopathy was tachycardia induced.  He has been maintained on amiodarone, though we have discussed the negative side effect profile of this agent several times.  We discussed referral to EP for consideration of alternative therapies versus discontinuation of amiodarone and close surveillance.  Mr. Ishida prefers the latter option.  We will therefore stop amiodarone today.  If he were to have recurrent atrial fibrillation, he would need to see EP to discuss further rhythm control options.  I encouraged him to lose weight.  Continue indefinite anticoagulation with apixaban.  I will check a CBC, CMP, and TSH today.  Morbid obesity: BMI remains greater than 40.  I encouraged  him to work on weight loss through diet and exercise.  He wonders if amiodarone may contribute to his difficulty with weight loss.  As noted above, we have agreed to stop amiodarone today.  We will check a lipid panel today for further risk stratification.  Follow-up: Return to clinic in 3 months.  Mr. Dibella has been advised that he must remain compliant with follow-up.  Further no-shows or same-day cancellations without extenuating circumstances will result in dismissal from our practice.  Yvonne Kendall, MD 06/24/2021 2:15 PM

## 2021-06-25 ENCOUNTER — Encounter: Payer: Self-pay | Admitting: Internal Medicine

## 2021-06-25 ENCOUNTER — Telehealth: Payer: Self-pay | Admitting: *Deleted

## 2021-06-25 DIAGNOSIS — I4891 Unspecified atrial fibrillation: Secondary | ICD-10-CM

## 2021-06-25 MED ORDER — LOSARTAN POTASSIUM 25 MG PO TABS: 12.5000 mg | ORAL_TABLET | Freq: Every day | ORAL | 2 refills | Status: DC

## 2021-06-25 MED ORDER — TORSEMIDE 20 MG PO TABS: 20.0000 mg | ORAL_TABLET | Freq: Two times a day (BID) | ORAL | 2 refills | Status: DC

## 2021-06-25 MED ORDER — POTASSIUM CHLORIDE CRYS ER 20 MEQ PO TBCR
20.0000 meq | EXTENDED_RELEASE_TABLET | Freq: Three times a day (TID) | ORAL | 2 refills | Status: DC
Start: 2021-06-25 — End: 2022-06-02

## 2021-06-25 MED ORDER — APIXABAN 5 MG PO TABS: ORAL_TABLET | ORAL | 1 refills | Status: DC

## 2021-06-25 NOTE — Addendum Note (Signed)
Addended by: Memory Dance on: 06/25/2021 04:02 PM   Modules accepted: Orders

## 2021-06-25 NOTE — Telephone Encounter (Signed)
-----   Message from Yvonne Kendall, MD sent at 06/25/2021  1:55 PM EDT ----- Please let Kenneth Ferguson know that his labs show mild hypokalemia and stable mild renal insufficiency.  His blood counts are normal.  His cholesterol is poorly controlled.  I recommend that he increase his potassium supplement to 20 mEq 3 times daily.  He should otherwise continue his medications as we discussed yesterday.  I would recommend that he work on weight loss through diet and exercise to help improve his lipid panel.

## 2021-06-25 NOTE — Telephone Encounter (Signed)
Attempted to call pt to discuss results and Dr. Darnelle Bos recc.  No answer. Lmtcb. No DPR on file.

## 2021-06-25 NOTE — Telephone Encounter (Signed)
Spoke with pt. Notified of lab results and Dr. Darnelle Bos recc below.  Pt voiced understanding.  Pt will incr potassium supplement to 20 mEq three times daily.  Pt voiced he is working on diet and exercise as well.  Refill requests sent in with incr dose of potassium to [facility] [address] in [place], per pt request.   Pt also requests refills of Eliquis. Will forward to anti-coag pool for refill of Eliquis.   Pt has no further questions at this time.

## 2021-06-25 NOTE — Telephone Encounter (Signed)
Prescription refill request for Eliquis received. Indication: Afib  Last office visit:06/24/21 (End)  Scr:1.40 (06/24/21)  Age: 56 Weight: 130.6kg  Appropriate dose and refill sent to requested pharmacy.

## 2021-06-30 ENCOUNTER — Institutional Professional Consult (permissible substitution): Payer: No Typology Code available for payment source | Admitting: Cardiology

## 2021-08-02 ENCOUNTER — Encounter: Payer: Self-pay | Admitting: Internal Medicine

## 2021-09-24 IMAGING — DX DG CHEST 1V PORT
1 series · 1 of 1 positions shown · non-contrast
Comparison: Chest x-ray dated August 06, 2018.

CLINICAL DATA: Shortness of breath.

EXAM:
PORTABLE CHEST 1 VIEW

[chest ap]
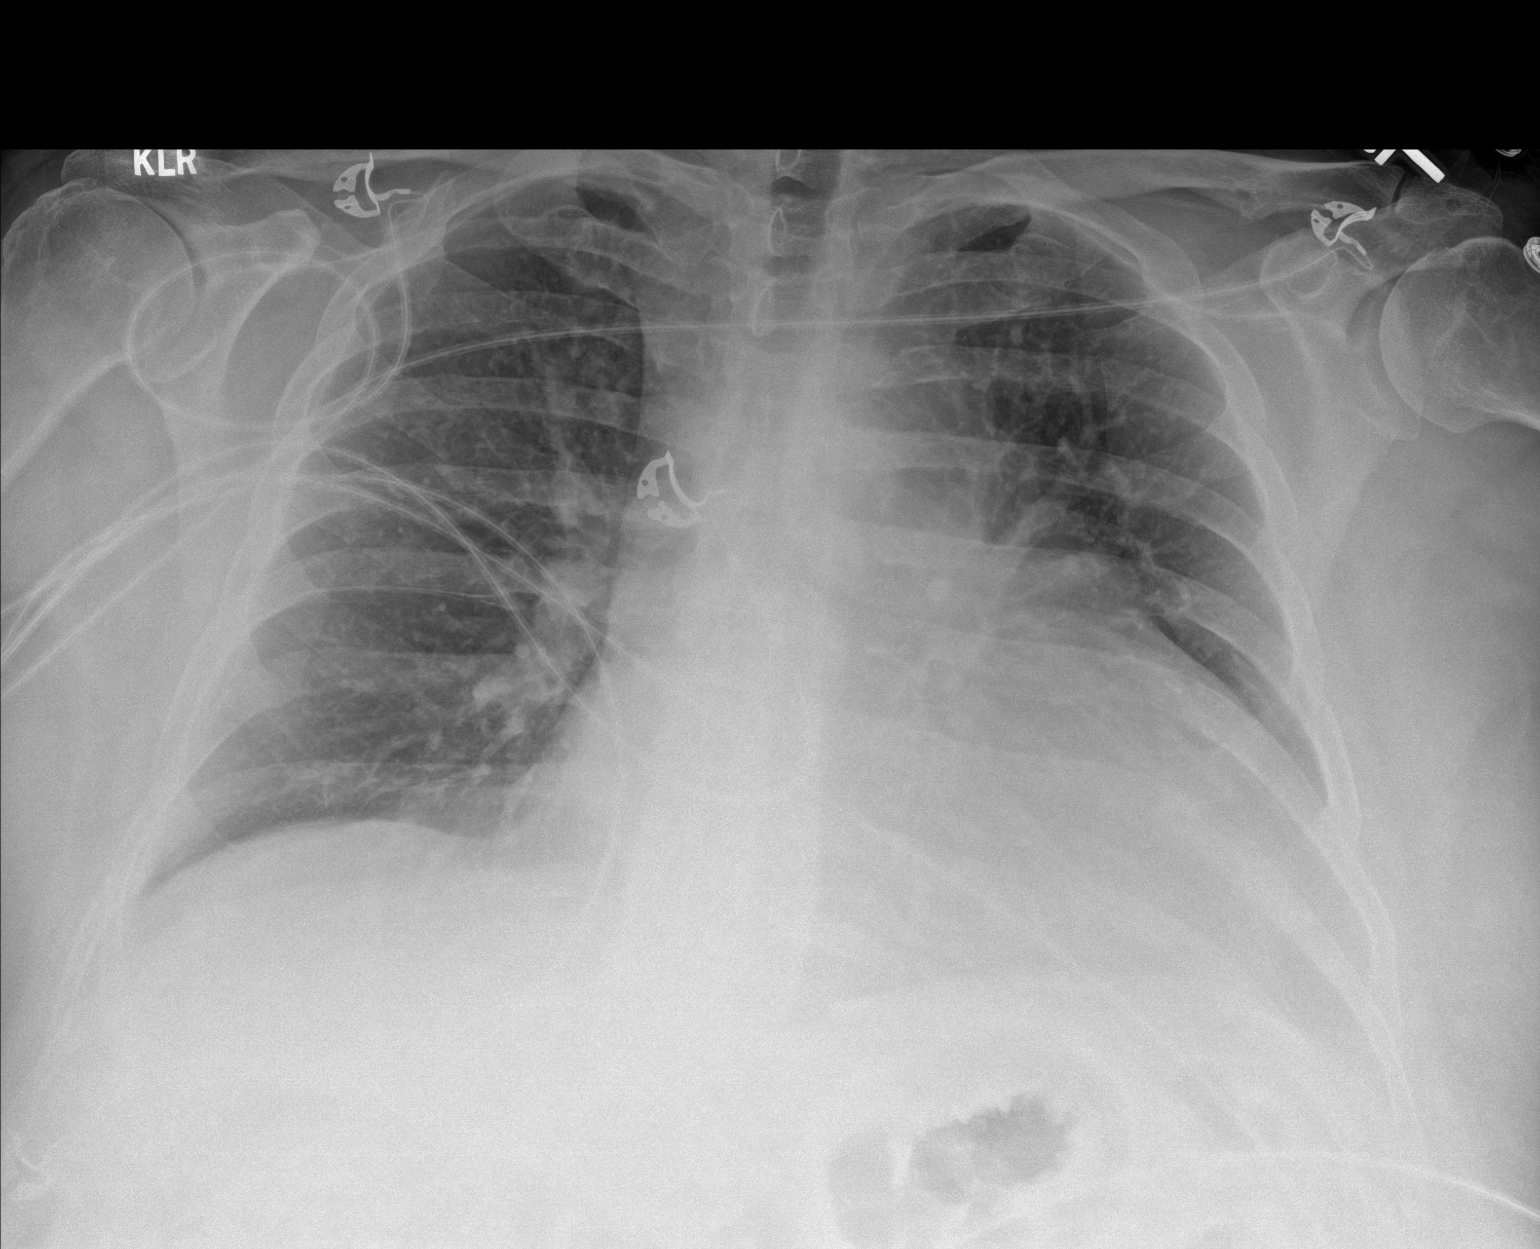

[1 of 1 positions shown; findings below may reference images not displayed]

FINDINGS: Stable cardiomegaly. Normal pulmonary vascularity. No focal
consolidation, pleural effusion, or pneumothorax. No acute osseous
abnormality.
IMPRESSION: 1. Cardiomegaly.  No active disease.

## 2021-09-29 ENCOUNTER — Ambulatory Visit: Payer: No Typology Code available for payment source | Admitting: Internal Medicine

## 2021-09-29 ENCOUNTER — Encounter: Payer: Self-pay | Admitting: Internal Medicine

## 2021-09-29 ENCOUNTER — Ambulatory Visit (INDEPENDENT_AMBULATORY_CARE_PROVIDER_SITE_OTHER): Payer: No Typology Code available for payment source | Admitting: Internal Medicine

## 2021-09-29 VITALS — BP 130/76 | HR 77 | Ht 68.5 in | Wt 294.0 lb

## 2021-09-29 DIAGNOSIS — I4819 Other persistent atrial fibrillation: Secondary | ICD-10-CM | POA: Diagnosis not present

## 2021-09-29 DIAGNOSIS — I422 Other hypertrophic cardiomyopathy: Secondary | ICD-10-CM | POA: Diagnosis not present

## 2021-09-29 DIAGNOSIS — I5022 Chronic systolic (congestive) heart failure: Secondary | ICD-10-CM | POA: Diagnosis not present

## 2021-09-29 DIAGNOSIS — E782 Mixed hyperlipidemia: Secondary | ICD-10-CM

## 2021-09-29 MED ORDER — TORSEMIDE 20 MG PO TABS: 20.0000 mg | ORAL_TABLET | Freq: Every day | ORAL | 1 refills | Status: DC

## 2021-09-29 NOTE — Progress Notes (Signed)
Follow-up Outpatient Visit Date: 09/29/2021  Primary Care Provider: Patient, [name] No address on file  Chief Complaint: Follow-up of atrial fibrillation and cardiomyopathy  HPI:  Kenneth Ferguson is a 56 y.o. male with history of chronic heart failure with reduced ejection fraction with normalization of LVEF, persistent atrial fibrillation complicated by left atrial appendage thrombus on chronic anticoagulation with apixaban, hypertrophic cardiomyopathy, and morbid obesity, who presents for follow-up of heart failure and atrial fibrillation.  I last saw Kenneth Ferguson in May, at which time he was feeling well.  He previously elected to stop bisoprolol, as it was making him feel "crappy."  He was maintained on torsemide and losartan.  Due to his young age, we agreed to discontinue amiodarone and monitor for recurrent atrial fibrillation, as Kenneth Ferguson did not wish to move forward with a EP consultation at that time.  Long-term anticoagulation with apixaban was maintained.  Today, Kenneth Ferguson reports that he is doing quite well.  He actually feels a little bit better since amiodarone was stopped.  He denies chest pain, shortness of breath, palpitations, lightheadedness, and edema.  He periodically checks his blood pressure at work and reports that it is typically normal.  He attributes slightly higher reading in the office today to having walked an extended distance to get to the office today.  --------------------------------------------------------------------------------------------------  Past Medical History:  Diagnosis Date   HFrEF (heart failure with reduced ejection fraction) ([place])    a. 08/2018 Echo: EF 40-45% in setting of Afib; b. 11/2018 Echo: EF 25-30% w/ sev ant, antsept, and apical HK in setting of Afib w/ RVR; b. 08/2019 Echo: EF 60-65%.   HOCM (hypertrophic obstructive cardiomyopathy) ([place])    a. Prev dx in early 2000s?; b. 08/2019 Echo: EF 60-65%, sev asym septal hypertrophy w/ septal  wall of 24mm. SAM. LVOT grad of 30mmHg @ rest, 45mmHg w/ valsalva. Gr2 DD. Nl RV fxn. Mod dil LA. Mild MR.   Morbid obesity ([place])    Persistent atrial fibrillation ([place])    a. 11/2018 s/p TEE/DCCV. Maintained on Amio/Eliquis (CHA2DS2VASc =1).   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 08/2018 TEE: LAA thrombus.   Past Surgical History:  Procedure Laterality Date   CARDIOVERSION N/A 08/08/2018   Procedure: CARDIOVERSION;  Surgeon: Minna Merritts, MD;  Location: [facility];  Service: Cardiovascular;  Laterality: N/A;   CARDIOVERSION N/A 11/20/2018   Procedure: CARDIOVERSION;  Surgeon: Nelva Bush, MD;  Location: [facility];  Service: Cardiovascular;  Laterality: N/A;   CARDIOVERSION N/A 11/20/2018   Procedure: CARDIOVERSION;  Surgeon: Nelva Bush, MD;  Location: [facility];  Service: Cardiovascular;  Laterality: N/A;   CARDIOVERSION N/A 12/07/2018   Procedure: CARDIOVERSION;  Surgeon: Minna Merritts, MD;  Location: [facility];  Service: Cardiovascular;  Laterality: N/A;   TEE WITHOUT CARDIOVERSION N/A 08/08/2018   Procedure: TRANSESOPHAGEAL ECHOCARDIOGRAM (TEE);  Surgeon: Minna Merritts, MD;  Location: [facility];  Service: Cardiovascular;  Laterality: N/A;   TEE WITHOUT CARDIOVERSION N/A 11/20/2018   Procedure: TRANSESOPHAGEAL ECHOCARDIOGRAM (TEE);  Surgeon: Nelva Bush, MD;  Location: [facility];  Service: Cardiovascular;  Laterality: N/A;    Current Meds  Medication Sig   apixaban (ELIQUIS) 5 MG TABS tablet TAKE 1 TABLET(5 MG) BY MOUTH TWICE DAILY   losartan (COZAAR) 25 MG tablet Take 0.5 tablets (12.5 mg total) by mouth daily.   potassium chloride SA (KLOR-CON M) 20 MEQ tablet Take 1 tablet (20 mEq total) by mouth  3 (three) times daily.   torsemide (DEMADEX) 20 MG tablet Take 1 tablet (20 mg total) by mouth 2 (two) times daily.    Allergies: Patient has no known allergies.  Social History   Tobacco Use   Smoking status: Never    Smokeless tobacco: Never  Vaping Use   Vaping Use: Never used  Substance Use Topics   Alcohol use: Never   Drug use: Never    Family History  Problem Relation Age of Onset   Emphysema Mother    Cancer Father     Review of Systems: A 12-system review of systems was performed and was negative except as noted in the HPI.  --------------------------------------------------------------------------------------------------  Physical Exam: BP 130/76 (BP Location: Left Arm, Patient Position: Sitting, Cuff Size: Large)   Pulse 77   Ht 5' 8.5" (1.74 m)   Wt 294 lb (133.4 kg)   SpO2 98%   BMI 44.05 kg/m   General:  NAD.  Accompanied by his son. Neck: No JVD or HJR, though body habitus limits evaluation. Lungs: Clear to auscultation bilaterally without wheezes or crackles. Heart: Regular rate and rhythm without murmurs, rubs, or gallops. Abdomen: But soft and nontender. Extremities: Trace ankle edema bilaterally.  EKG:  Normal sinus rhythm with PVCs, low voltage, and incomplete LBBB.  No significant change since 06/24/2021.  Lab Results  Component Value Date   WBC 7.6 06/24/2021   HGB 13.8 06/24/2021   HCT 41.3 06/24/2021   MCV 87.5 06/24/2021   PLT 240 06/24/2021    Lab Results  Component Value Date   NA 136 06/24/2021   K 3.3 (L) 06/24/2021   CL 101 06/24/2021   CO2 26 06/24/2021   BUN 18 06/24/2021   CREATININE 1.40 (H) 06/24/2021   GLUCOSE 101 (H) 06/24/2021   ALT 19 06/24/2021    Lab Results  Component Value Date   CHOL 237 (H) 06/24/2021   HDL 38 (L) 06/24/2021   LDLCALC 158 (H) 06/24/2021   TRIG 203 (H) 06/24/2021   CHOLHDL 6.2 06/24/2021    --------------------------------------------------------------------------------------------------  ASSESSMENT AND PLAN: 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  He has trace edema on exam that is  stable.  We have agreed to decrease torsemide to 20 mg once daily, though he should take a second dose if he has worsening edema or weight gain.  We will continue potassium supplementation given prior hypokalemia.  I recommended repeating a BMP today, though Kenneth Ferguson declined.  We will need to repeat labs at his next follow-up visit.  We will continue low-dose losartan.  He has previously been intolerant of beta-blockers.  Defer challenging with spironolactone and SGLT2 inhibitor in the setting of normalized LVEF and NYHA class I symptoms.  Persistent atrial fibrillation: Kenneth Ferguson is maintaining sinus rhythm after discontinuation of amiodarone at our last visit.  Continue apixaban 5 mg twice daily.  We will need to readdress EP consultation if he has any recurrent atrial fibrillation.  Hypertrophic cardiomyopathy: No symptoms reported.  Kenneth Ferguson previously declined EP consultation.  He has also been intolerant of beta-blockers.  Morbid obesity: BMI remains greater than 40.  Kenneth Ferguson is quite active at work.  I encouraged him to keep working on weight loss through diet and exercise.  Mixed hyperlipidemia: Triglycerides and LDL mildly elevated on last check in May.  Given absence of established ASCVD, he should continue working on lifestyle modifications.  Follow-up: Return to clinic in 6 months.  Yvonne Kendall, MD 09/29/2021 9:53 AM

## 2021-09-29 NOTE — Patient Instructions (Signed)
Medication Instructions:   Your physician has recommended you make the following change in your medication:   DECREASE Torsemide 20 mg daily - May take twice daily as needed for swelling or weight increase  *If you need a refill on your cardiac medications before your next appointment, please call your pharmacy*   Lab Work:  None ordered  Testing/Procedures:  None ordered   Follow-Up: At [facility], you and your health needs are our priority.  As part of our continuing mission to provide you with exceptional heart care, we have created designated Provider Care Teams.  These Care Teams include your primary Cardiologist (physician) and Advanced Practice Providers (APPs -  Physician Assistants and Nurse Practitioners) who all work together to provide you with the care you need, when you need it.  We recommend signing up for the patient portal called "MyChart".  Sign up information is provided on this After Visit Summary.  MyChart is used to connect with patients for Virtual Visits (Telemedicine).  Patients are able to view lab/test results, encounter notes, upcoming appointments, etc.  Non-urgent messages can be sent to your provider as well.   To learn more about what you can do with MyChart, go to [email or web address].    Your next appointment:   6 month(s)  The format for your next appointment:   In Person  Provider:   You may see Yvonne Kendall, MD or one of the following Advanced Practice Providers on your designated Care Team:   Nicolasa Ducking, NP Eula Listen, PA-C Cadence Fransico Michael, [place]    Important Information About Sugar

## 2021-09-30 ENCOUNTER — Other Ambulatory Visit: Payer: Self-pay | Admitting: Internal Medicine

## 2022-04-01 ENCOUNTER — Ambulatory Visit: Payer: No Typology Code available for payment source | Admitting: Internal Medicine

## 2022-06-02 ENCOUNTER — Ambulatory Visit: Payer: No Typology Code available for payment source | Attending: Internal Medicine | Admitting: Internal Medicine

## 2022-06-02 ENCOUNTER — Encounter: Payer: Self-pay | Admitting: Internal Medicine

## 2022-06-02 ENCOUNTER — Other Ambulatory Visit
Admission: RE | Admit: 2022-06-02 | Discharge: 2022-06-02 | Disposition: A | Discharge status: Home / Self Care | Payer: PRIVATE HEALTH INSURANCE | Source: Ambulatory Visit | Attending: Internal Medicine | Admitting: Internal Medicine

## 2022-06-02 VITALS — BP 138/82 | HR 83 | Ht 68.0 in | Wt 290.0 lb

## 2022-06-02 DIAGNOSIS — Z79899 Other long term (current) drug therapy: Secondary | ICD-10-CM

## 2022-06-02 DIAGNOSIS — I5022 Chronic systolic (congestive) heart failure: Secondary | ICD-10-CM

## 2022-06-02 DIAGNOSIS — I422 Other hypertrophic cardiomyopathy: Secondary | ICD-10-CM

## 2022-06-02 DIAGNOSIS — I4819 Other persistent atrial fibrillation: Secondary | ICD-10-CM | POA: Diagnosis not present

## 2022-06-02 DIAGNOSIS — E782 Mixed hyperlipidemia: Secondary | ICD-10-CM

## 2022-06-02 DIAGNOSIS — I4891 Unspecified atrial fibrillation: Secondary | ICD-10-CM

## 2022-06-02 LAB — COMPREHENSIVE METABOLIC PANEL
ALT: 21 U/L (ref 0–44)
AST: 24 U/L (ref 15–41)
Albumin: 3.8 g/dL (ref 3.5–5.0)
Alkaline Phosphatase: 73 U/L (ref 38–126)
Anion gap: 7 (ref 5–15)
BUN: 13 mg/dL (ref 6–20)
CO2: 25 mmol/L (ref 22–32)
Calcium: 8.8 mg/dL — ABNORMAL LOW (ref 8.9–10.3)
Chloride: 106 mmol/L (ref 98–111)
Creatinine, Ser: 1.04 mg/dL (ref 0.61–1.24)
GFR, Estimated: >60 mL/min (ref >60)
Glucose, Bld: 98 mg/dL (ref 70–99)
Potassium: 4.1 mmol/L (ref 3.5–5.1)
Sodium: 138 mmol/L (ref 135–145)
Total Bilirubin: 0.7 mg/dL (ref 0.3–1.2)
Total Protein: 7.3 g/dL (ref 6.5–8.1)

## 2022-06-02 LAB — CBC
HCT: 43.7 % (ref 39.0–52.0)
Hemoglobin: 14.8 g/dL (ref 13.0–17.0)
MCH: 29.9 pg (ref 26.0–34.0)
MCHC: 33.9 g/dL (ref 30.0–36.0)
MCV: 88.3 fL (ref 80.0–100.0)
Platelets: 228 10*3/uL (ref 150–400)
RBC: 4.95 MIL/uL (ref 4.22–5.81)
RDW: 14 % (ref 11.5–15.5)
WBC: 6.4 10*3/uL (ref 4.0–10.5)
nRBC: 0 % (ref 0.0–0.2)

## 2022-06-02 LAB — LIPID PANEL
Cholesterol: 207 mg/dL — ABNORMAL HIGH (ref 0–200)
HDL: 36 mg/dL — ABNORMAL LOW (ref >40)
LDL Cholesterol: 152 mg/dL — ABNORMAL HIGH (ref 0–99)
Total CHOL/HDL Ratio: 5.8 RATIO
Triglycerides: 97 mg/dL (ref <150)
VLDL: 19 mg/dL (ref 0–40)

## 2022-06-02 NOTE — Progress Notes (Signed)
Follow-up Outpatient Visit Date: 06/02/2022  Primary Care Provider: Patient, [name] No address on file  Chief Complaint: Follow-up atrial fibrillation and cardiomyopathy  HPI:  Mr. Lincks is a 57 y.o. male with history of chronic heart failure with reduced ejection fraction with normalization of LVEF, persistent atrial fibrillation complicated by left atrial appendage thrombus on chronic anticoagulation with apixaban, hypertrophic cardiomyopathy, and morbid obesity, who presents for follow-up of heart failure and atrial fibrillation.  I last saw him in 09/2021, at which time he was feeling well.  We had previously stopped amiodarone without evidence of recurrent atrial fibrillation.  Today, Mr. Zajkowski reports that he is feeling fantastic.  He notes this is the best he has felt in at least 5 years.  He has not had any chest pain, shortness of breath, palpitations, lightheadedness, edema, and syncope.  He exercises regularly without any limitations and continues working as a Licensed conveyancer.  Home blood pressure is typically better, having measured 118 systolic yesterday evening.  Since our last visit, he self discontinued losartan; the only medication he is taking now is apixaban 5 mg twice daily.  He has not had any bleeding.  --------------------------------------------------------------------------------------------------  Past Medical History:  Diagnosis Date   HFrEF (heart failure with reduced ejection fraction)    a. 08/2018 Echo: EF 40-45% in setting of Afib; b. 11/2018 Echo: EF 25-30% w/ sev ant, antsept, and apical HK in setting of Afib w/ RVR; b. 08/2019 Echo: EF 60-65%.   HOCM (hypertrophic obstructive cardiomyopathy)    a. Prev dx in early 2000s?; b. 08/2019 Echo: EF 60-65%, sev asym septal hypertrophy w/ septal wall of 20mm. SAM. LVOT grad of @ rest, w/ valsalva. Gr2 DD. Nl RV fxn. Mod dil LA. Mild MR.   Morbid obesity    Persistent atrial  fibrillation    a. 11/2018 s/p TEE/DCCV. Maintained on Amio/Eliquis (CHA2DS2VASc =1).   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 08/2018 TEE: LAA thrombus.   Past Surgical History:  Procedure Laterality Date   CARDIOVERSION N/A 08/08/2018   Procedure: CARDIOVERSION;  Surgeon: Antonieta Iba, MD;  Location: [facility];  Service: Cardiovascular;  Laterality: N/A;   CARDIOVERSION N/A 11/20/2018   Procedure: CARDIOVERSION;  Surgeon: Yvonne Kendall, MD;  Location: [facility];  Service: Cardiovascular;  Laterality: N/A;   CARDIOVERSION N/A 11/20/2018   Procedure: CARDIOVERSION;  Surgeon: Yvonne Kendall, MD;  Location: [facility];  Service: Cardiovascular;  Laterality: N/A;   CARDIOVERSION N/A 12/07/2018   Procedure: CARDIOVERSION;  Surgeon: Antonieta Iba, MD;  Location: [facility];  Service: Cardiovascular;  Laterality: N/A;   TEE WITHOUT CARDIOVERSION N/A 08/08/2018   Procedure: TRANSESOPHAGEAL ECHOCARDIOGRAM (TEE);  Surgeon: Antonieta Iba, MD;  Location: [facility];  Service: Cardiovascular;  Laterality: N/A;   TEE WITHOUT CARDIOVERSION N/A 11/20/2018   Procedure: TRANSESOPHAGEAL ECHOCARDIOGRAM (TEE);  Surgeon: Yvonne Kendall, MD;  Location: [facility];  Service: Cardiovascular;  Laterality: N/A;    Current Meds  Medication Sig   apixaban (ELIQUIS) 5 MG TABS tablet TAKE 1 TABLET(5 MG) BY MOUTH TWICE DAILY    Allergies: Patient has no known allergies.  Social History   Tobacco Use   Smoking status: Never   Smokeless tobacco: Never  Vaping Use   Vaping Use: Never used  Substance Use Topics   Alcohol use: Never   Drug use: Never    Family History  Problem Relation Age of Onset   Emphysema  Mother    Cancer Father     Review of Systems: A 12-system review of systems was performed and was negative except as noted in the HPI.  --------------------------------------------------------------------------------------------------  Physical  Exam: BP 138/82   Pulse 83   Ht 5\' 8"  (1.727 m)   Wt 290 lb (131.5 kg)   SpO2 98%   BMI 44.09 kg/m  Repeat BP: 138/82  General:  NAD.  Accompanied by his son. Neck: No JVD or HJR, though body habitus limits evaluation. Lungs: Clear to auscultation bilaterally without wheezes or crackles. Heart: Regular rate and rhythm without murmurs, rubs, or gallops. Abdomen: Soft, nontender, nondistended. Extremities: No lower extremity edema.  EKG: Normal sinus rhythm with PVCs and possible septal infarct.  No significant change from prior tracing on 09/29/2021.  Lab Results  Component Value Date   WBC 7.6 06/24/2021   HGB 13.8 06/24/2021   HCT 41.3 06/24/2021   MCV 87.5 06/24/2021   PLT 240 06/24/2021    Lab Results  Component Value Date   NA 136 06/24/2021   K 3.3 (L) 06/24/2021   CL 101 06/24/2021   CO2 26 06/24/2021   BUN 18 06/24/2021   CREATININE 1.40 (H) 06/24/2021   GLUCOSE 101 (H) 06/24/2021   ALT 19 06/24/2021    Lab Results  Component Value Date   CHOL 237 (H) 06/24/2021   HDL 38 (L) 06/24/2021   LDLCALC 158 (H) 06/24/2021   TRIG 203 (H) 06/24/2021   CHOLHDL 6.2 06/24/2021    --------------------------------------------------------------------------------------------------  ASSESSMENT AND PLAN: Persistent atrial fibrillation: Mr. Swopes is maintaining sinus rhythm off amiodarone.  We will continue indefinite anticoagulation with apixaban.  He has not wish to be on beta-blockers in the past.  We have discussed EP consultation several times in the past, though Mr. Smethers has always deferred this.  Given that he is maintaining sinus rhythm, we will defer sending him to EP, though this would need to be considered in the future where he to have recurrent atrial fibrillation.  He had been on amiodarone for an extended period of time; this is not a good long-term strategy for Mr. Mehlhoff given his young age.  We will check a CMP and CBC today in the setting of long-term  anticoagulation.  Chronic HFrEF with recovered ejection fraction: Mr. Bonet appears euvolemic with NYHA class I symptoms.  He is now off all GDMT, having self discontinued losartan after our last visit.  We discussed the importance of medical therapy as well as blood pressure control, though he does not wish to make any medication changes today.  Hypertrophic cardiomyopathy: No symptoms reported.  Cardiac MRI and EP consultation previously recommended.  We again discussed the natural history of hypertrophic cardiomyopathy and associated risks including heart failure, arrhythmias, and sudden cardiac death.  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  We discussed moving forward with additional testing and consultations (EP and cardiac genetics).  He wishes to discuss this with his son before moving forward.  I have advised Mr. Knippenberg and his son Toney Sang) to ensure that Toney Sang also undergoes appropriate screening for HCM.  Hyperlipidemia: LDL moderately elevated on last check a year ago.  Lifestyle modifications encouraged.  Recheck CMP and lipid panel today.  Morbid obesity: BMI remains greater than 40.  Weight loss encouraged through diet and exercise.  Follow-up: Return to clinic in 6 months.  Yvonne Kendall, MD 06/02/2022 12:11 PM

## 2022-06-02 NOTE — Patient Instructions (Signed)
Medication Instructions:  Your Physician recommend you continue on your current medication as directed.     *If you need a refill on your cardiac medications before your next appointment, please call your pharmacy*   Lab Work: Your provider would like for you to have following labs drawn: CBC, CMP and Lipid Panel.   Please go to the [facility] entrance and check in at the front desk.  You do not need an appointment.  They are open from 7am-6 pm.   If you have labs (blood work) drawn today and your tests are completely normal, you will receive your results only by: MyChart Message (if you have MyChart) OR A paper copy in the mail If you have any lab test that is abnormal or we need to change your treatment, we will call you to review the results.   Testing/Procedures: No testing ordered today.    Follow-Up: At [facility], you and your health needs are our priority.  As part of our continuing mission to provide you with exceptional heart care, we have created designated Provider Care Teams.  These Care Teams include your primary Cardiologist (physician) and Advanced Practice Providers (APPs -  Physician Assistants and Nurse Practitioners) who all work together to provide you with the care you need, when you need it.  We recommend signing up for the patient portal called "MyChart".  Sign up information is provided on this After Visit Summary.  MyChart is used to connect with patients for Virtual Visits (Telemedicine).  Patients are able to view lab/test results, encounter notes, upcoming appointments, etc.  Non-urgent messages can be sent to your provider as well.   To learn more about what you can do with MyChart, go to [email or web address].    Your next appointment:   6 month(s)  Provider:   You may see Yvonne Kendall, MD or one of the following Advanced Practice Providers on your designated Care Team:   Nicolasa Ducking, NP Eula Listen, PA-C Cadence Fransico Michael,  PA-C Charlsie Quest, NP    Other Instructions Dr. Okey Dupre is recommending a Cardiac MRI, Genetic Counseling and a referral to an Electrophysiologist. Please call the office if you would like to proceed with any of his recommendations.

## 2022-06-04 ENCOUNTER — Encounter: Payer: Self-pay | Admitting: Internal Medicine

## 2023-06-21 NOTE — Progress Notes (Deleted)
 Cardiology Office Note    Date:  06/21/2023   ID:  Kenneth Ferguson, DOB Jun 10, 1965, MRN [id number]  PCP:  Patient, [name]  Cardiologist:  Kenneth Crisp, MD  Electrophysiologist:  Kenneth Byes, MD   Chief Complaint: ***  History of Present Illness:   Kenneth Ferguson is a 58 y.o. male with history of used to ejection fraction with normalization of LVEF, persistent atrial fibrillation complicated by left atrial appendage thrombus on chronic anticoagulation with apixaban , hypertrophic cardiomyopathy, and morbid obesity who presents for ***    Patient's cardiac history dates back to the early 2020 was living in [place] .  He was reportedly found to have a murmur on routine physical and was referred to cardiology.  It was determined that he had LVH with no further details available.  He did well until 2015, when he was diagnosed with atrial fibrillation.  He underwent successful TEE cardioversion at an outside hospital.  Reportedly diagnostic catheterization was performed sometime afterward and he was told he had normal coronary arteries.    In 2018, he was reportedly admitted with recurrent atrial fibrillation at which time he was reportedly diagnosed with HOCM ([facility]).  A LifeVest was placed, which she self discontinued and was subsequently lost to follow-up.  In 06/2018 he suffered the unexpected loss of his wife which resulted in acute stress reaction and recurrent atrial fibrillation.  He was seen at an outside hospital and diagnosed with A-fib with RVR and placed on a beta-blocker.  He discontinued this due to concerns for bradycardia on home pulse oximetry.    He was seen by a cardiologist in [place] with recommendation for follow through with prior recommended workup including echo, outpatient cardiac monitoring, and cardiac MRI.  He did not follow through with this and was ultimately admitted to [facility] later that month with A-fib with RVR and acute systolic CHF.  Echo at  that time showed severe hypokinesis with EF 40 to 45% with moderately increased LV wall thickness.  TEE showed left atrial appendage thrombus and thus he was unable to be cardioverted.  He required gentle diuresis.  Heart rates were difficult to control despite beta-blocker and digoxin  therapy.    At follow-up 08/2018, BP was low preventing additional titration of AV nodal blocking agents.  He underwent successful TEE cardioversion 11/2018.  He presented for follow-up 10 days after cardioversion 11/2018 and was found to be back in atrial fibrillation with RVR and significantly volume overloaded.  He was directly admitted to [facility] ICU for diuresis and rate/rhythm management.  Echo showed EF 25 to 30%.  He underwent aggressive diuresis.  He was loaded with amiodarone  and he underwent successful cardioversion prior to discharge.    Echo 08/2019 showed EF 60 to 65% with severe asymmetric septal hypertrophy, systolic anterior motion of the mitral valve causing LVOT obstruction with posterior directed mitral regurgitation.  LVOT gradient was 17 mmHg at rest and increased to 49 mmHg with Valsalva.  It was recommended that he undergo cardiac MRI but it does not appear that this was ever completed.  Patient has been referred to EP several times and subsequently no showed his appointments.  Patient was ultimately dismissed from our practice 06/2021 after having canceled or no-show to 19 times with our practice.  He called to report that he would like to reestablish due to almost being out of his medications.  He frequently stops medications on his own and is noncompliant.  His amiodarone  was discontinued without  evidence of recurrent atrial fibrillation.  It was recommended that if he had recurrence he be referred to EP for further management.    Patient was most recently seen by Dr. [name] 05/2022 and was doing well from a cardiac perspective.  He had discontinued all of his medications besides apixaban  5 mg twice daily.   He did not wish to add any more medications at that appointment.  It was again recommended that he undergo cardiac MRI and referral to EP and cardiac genetics for hypertrophic cardiomyopathy.  Labs independently reviewed: 06/02/2022- Hgb 14.8, HCT 43.7, K4.1, BUN 13, CR 1.04, normal LFTs, TC 207, TG 97, HDL 36, LDL 152  Past Medical History:  Diagnosis Date   HFrEF (heart failure with reduced ejection fraction) (HCC)    a. 08/2018 Echo: EF 40-45% in setting of Afib; b. 11/2018 Echo: EF 25-30% w/ sev ant, antsept, and apical HK in setting of Afib w/ RVR; b. 08/2019 Echo: EF 60-65%.   HOCM (hypertrophic obstructive cardiomyopathy) (HCC)    a. Prev dx in early 2000s?; b. 08/2019 Echo: EF 60-65%, sev asym septal hypertrophy w/ septal wall of 20mm. SAM. LVOT grad of @ rest, w/ valsalva. Gr2 DD. Nl RV fxn. Mod dil LA. Mild MR.   Morbid obesity (HCC)    Persistent atrial fibrillation (HCC)    a. 11/2018 s/p TEE/DCCV. Maintained on Amio/Eliquis  (CHA2DS2VASc =1).   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 08/2018 TEE: LAA thrombus.    Past Surgical History:  Procedure Laterality Date   CARDIOVERSION N/A 08/08/2018   Procedure: CARDIOVERSION;  Surgeon: Devorah Fonder, MD;  Location: [facility] ORS;  Service: Cardiovascular;  Laterality: N/A;   CARDIOVERSION N/A 11/20/2018   Procedure: CARDIOVERSION;  Surgeon: Kenneth Crisp, MD;  Location: [facility] ORS;  Service: Cardiovascular;  Laterality: N/A;   CARDIOVERSION N/A 11/20/2018   Procedure: CARDIOVERSION;  Surgeon: Kenneth Crisp, MD;  Location: [facility] ORS;  Service: Cardiovascular;  Laterality: N/A;   CARDIOVERSION N/A 12/07/2018   Procedure: CARDIOVERSION;  Surgeon: Devorah Fonder, MD;  Location: [facility] ORS;  Service: Cardiovascular;  Laterality: N/A;   TEE WITHOUT CARDIOVERSION N/A 08/08/2018   Procedure: TRANSESOPHAGEAL ECHOCARDIOGRAM (TEE);  Surgeon: Devorah Fonder, MD;  Location: [facility] ORS;  Service:  Cardiovascular;  Laterality: N/A;   TEE WITHOUT CARDIOVERSION N/A 11/20/2018   Procedure: TRANSESOPHAGEAL ECHOCARDIOGRAM (TEE);  Surgeon: Kenneth Crisp, MD;  Location: [facility] ORS;  Service: Cardiovascular;  Laterality: N/A;    Current Medications: No outpatient medications have been marked as taking for the 06/23/23 encounter (Appointment) with Kenneth Chick, PA-C.    Allergies:   Patient has no known allergies.   Social History   Socioeconomic History   Marital status: Widowed    Spouse name: Not on file   Number of children: Not on file   Years of education: Not on file   Highest education level: Not on file  Occupational History   Not on file  Tobacco Use   Smoking status: Never   Smokeless tobacco: Never  Vaping Use   Vaping status: Never Used  Substance and Sexual Activity   Alcohol use: Never   Drug use: Never   Sexual activity: Not on file  Other Topics Concern   Not on file  Social History Narrative   Not on file   Social Drivers of Health   Financial Resource Strain: Not on file  Food Insecurity: Not on file  Transportation Needs: Not on file  Physical Activity: Not on file  Stress: Not on file  Social Connections: Not on file     Family History:  The patient's family history includes Cancer in his father; Emphysema in his mother.  ROS:   12-point review of systems is negative unless otherwise noted in the HPI.   EKGs/Labs/Other Studies Reviewed:    Studies reviewed were summarized above. The additional studies were reviewed today:  08/08/2019 Echo complete 1. Patient has severe asymmetric septal hypertrophy with septal wall  measuring up to 20mm. There is systolic anterior motion (SAM) of mitral  valve causing LVOT obstruction with posteriorly directed mitral  regurgitation. LVOT gradient is at rest  increasing to with valvsalva. Recommend Cardiac MR for further  assessment as findings are consistent with HCM.Kenneth Ferguson Left ventricular  ejection  fraction, by estimation, is 60 to 65%. The left ventricle has normal  function. The left ventricle has no  regional wall motion abnormalities. There is severe asymmetric left  ventricular hypertrophy of the septal segment. Left ventricular diastolic  parameters are consistent with Grade II diastolic dysfunction  (pseudonormalization).   2. Right ventricular systolic function is normal. The right ventricular  size is mildly enlarged. There is moderately elevated pulmonary artery  systolic pressure.   3. Left atrial size was moderately dilated.   4. The mitral valve is grossly normal. Mild mitral valve regurgitation.   5. The aortic valve is tricuspid. Aortic valve regurgitation is not  visualized.   EKG:  EKG is ordered today.  The EKG ordered today demonstrates ***  Recent Labs: No results found for requested labs within last 365 days.  Recent Lipid Panel    Component Value Date/Time   CHOL 207 (H) 06/02/2022 1242   TRIG 97 06/02/2022 1242   HDL 36 (L) 06/02/2022 1242   CHOLHDL 5.8 06/02/2022 1242   VLDL 19 06/02/2022 1242   LDLCALC 152 (H) 06/02/2022 1242    PHYSICAL EXAM:    VS:  There were no vitals taken for this visit.  BMI: There is no height or weight on file to calculate BMI.  Physical Exam  Wt Readings from Last 3 Encounters:  06/02/22 290 lb (131.5 kg)  09/29/21 294 lb (133.4 kg)  06/24/21 288 lb (130.6 kg)     ASSESSMENT & PLAN:   ***   {Are you ordering a CV Procedure (e.g. stress test, cath, DCCV, TEE, etc)?   Press F2        :[id number]}     Disposition: F/u with Dr. [name] or an APP in ***.   Medication Adjustments/Labs and Tests Ordered: Current medicines are reviewed at length with the patient today.  Concerns regarding medicines are outlined above. Medication changes, Labs and Tests ordered today are summarized above and listed in the Patient Instructions accessible in Encounters.   Beather Liming, PA-C 06/21/2023 1:41 PM      [facility] [address] [place], [place] [address] [phone]

## 2023-06-23 ENCOUNTER — Ambulatory Visit: Admitting: Physician Assistant

## 2023-07-14 ENCOUNTER — Encounter: Payer: Self-pay | Admitting: Medical

## 2023-07-14 ENCOUNTER — Ambulatory Visit: Attending: Medical | Admitting: Medical

## 2023-07-14 VITALS — BP 128/86 | HR 88 | Ht 68.0 in | Wt 282.4 lb

## 2023-07-14 DIAGNOSIS — I5022 Chronic systolic (congestive) heart failure: Secondary | ICD-10-CM | POA: Diagnosis not present

## 2023-07-14 DIAGNOSIS — I422 Other hypertrophic cardiomyopathy: Secondary | ICD-10-CM | POA: Diagnosis not present

## 2023-07-14 DIAGNOSIS — E782 Mixed hyperlipidemia: Secondary | ICD-10-CM

## 2023-07-14 DIAGNOSIS — Z79899 Other long term (current) drug therapy: Secondary | ICD-10-CM

## 2023-07-14 DIAGNOSIS — I4819 Other persistent atrial fibrillation: Secondary | ICD-10-CM | POA: Diagnosis not present

## 2023-07-14 NOTE — Patient Instructions (Signed)
 Medication Instructions:  Your physician recommends that you continue on your current medications as directed. Please refer to the Current Medication list given to you today.    *If you need a refill on your cardiac medications before your next appointment, please call your pharmacy*  Lab Work: Your provider would like for you to have following labs drawn today (CMP, CBC, Lipid, Direct LDL).    Testing/Procedures: No test ordered today   Follow-Up: At [facility], you and your health needs are our priority.  As part of our continuing mission to provide you with exceptional heart care, our providers are all part of one team.  This team includes your primary Cardiologist (physician) and Advanced Practice Providers or APPs (Physician Assistants and Nurse Practitioners) who all work together to provide you with the care you need, when you need it.  Your next appointment:   1 year(s)  Provider:   You may see Sammy Crisp, MD or one of the following Advanced Practice Providers on your designated Care Team:   Laneta Pintos, NP Gildardo Labrador, PA-C Varney Gentleman, PA-C Cadence Napoleon, PA-C Ronald Cockayne, NP Morey Ar, NP

## 2023-07-14 NOTE — Progress Notes (Signed)
 Cardiology Office Note   Date:  07/14/2023  ID:  Kenneth Ferguson, DOB 18-Sep-1965, MRN [id number] PCP: Patient, [name]  [facility] Providers Cardiologist:  Sammy Crisp, MD Electrophysiologist:  Boyce Byes, MD   History of Present Illness Kenneth Ferguson is a 58 y.o. male with a h/o chronic heart failure with reduced EF with normalization of LVEF, persistent Afib complicated by left atrial appendage thrombus on chronic anticoagulation with Eliquis , HCM, morbid obesity, and noncompliance who presents for follow-up for heart failure and Afib.   Amiodarone  previously stopped for recurrent Afib. Cardiac MRI and EP consult has previously been discussed in regards to HCM, but patient was deferred.  The patient was last seen 05/2022 and was stable from a cardiac perspective. He was in NSR.   Today, the patient report he has been stable. He denies chest pain or SOB. No heart heart racing, pre-syncope, syncope or palpitations. He has dependent lower leg edema. He has stopped Eliquis  because he "feels he dose not need it."  Patient is a paramedic. He was not having any bleeding issues, just decided to stop Eliquis .. He is back in the gym and trying to eat better. Patient is not interested in cMRI.   Studies Reviewed EKG Interpretation Date/Time:  Friday July 14 2023 11:33:04 EDT Ventricular Rate:  88 PR Interval:  188 QRS Duration:  100 QT Interval:  372 QTC Calculation: 450 R Axis:   24  Text Interpretation: Sinus rhythm with Premature supraventricular complexes and with occasional Premature ventricular complexes Abnormal QRS-T angle, consider primary T wave abnormality When compared with ECG of 07-Dec-2018 07:45, PREVIOUS ECG IS PRESENT Confirmed by Gennaro Khat, Jasmyne Lodato ([id number]) on 07/14/2023 11:46:35 AM    Echo 08/2019 1. Patient has severe asymmetric septal hypertrophy with septal wall  measuring up to 20mm. There is systolic anterior motion (SAM) of mitral  valve causing LVOT  obstruction with posteriorly directed mitral  regurgitation. LVOT gradient is at rest  increasing to with valvsalva. Recommend Cardiac MR for further  assessment as findings are consistent with HCM.Aaron Aas Left ventricular ejection  fraction, by estimation, is 60 to 65%. The left ventricle has normal  function. The left ventricle has no  regional wall motion abnormalities. There is severe asymmetric left  ventricular hypertrophy of the septal segment. Left ventricular diastolic  parameters are consistent with Grade II diastolic dysfunction  (pseudonormalization).   2. Right ventricular systolic function is normal. The right ventricular  size is mildly enlarged. There is moderately elevated pulmonary artery  systolic pressure.   3. Left atrial size was moderately dilated.   4. The mitral valve is grossly normal. Mild mitral valve regurgitation.   5. The aortic valve is tricuspid. Aortic valve regurgitation is not  visualized.   Echo 11/2018 1. Left ventricular ejection fraction, by visual estimation, is 25 to  30%. The left ventricle has moderate to severely decreased function.  Normal left ventricular size. There is moderately increased left  ventricular hypertrophy.Severe hypokinesis of the   anterior, anteroseptal and apical walls   2. Global right ventricle has moderately reduced systolic function.The  right ventricular size is normal. No increase in right ventricular wall  thickness.   3. Left atrial size was moderately dilated.   4. Mildly elevated pulmonary artery systolic pressure.   5. Arrhythmia noted, rate 133 bpm   Echo 07/2018 1. Severe hypokinesis of the left ventricular, entire anteroseptal wall,  apical segment and anterior wall.   2. The left ventricle has  mild-moderately reduced systolic function, with  an ejection fraction of 40-45%. The cavity size was normal. There is  moderately increased left ventricular wall thickness. Left ventricular  diastolic  function could not be  evaluated secondary to atrial fibrillation.   3. The right ventricle has moderately reduced systolic function. The  cavity was not assessed. There is mildly increased right ventricular wall  thickness. Right ventricular systolic pressure could not be assessed.   4. Left atrial size was moderately dilated.   5. Moderate pericardial effusion.   6. The pericardial effusion is posterior and lateral to the left  ventricle.   7. The mitral valve is degenerative. Mild thickening of the mitral valve  leaflet.   8. The tricuspid valve is grossly normal.   9. The aortic valve is tricuspid. Mild calcification of the aortic valve.  10. The aortic root is normal in size and structure.  11. The inferior vena cava was dilated in size with <50% respiratory  variability.        Physical Exam VS:  BP 128/86   Pulse 88   Ht 5\' 8"  (1.727 m)   Wt 282 lb 6.4 oz (128.1 kg)   SpO2 97%   BMI 42.94 kg/m    Wt Readings from Last 3 Encounters:  07/14/23 282 lb 6.4 oz (128.1 kg)  06/02/22 290 lb (131.5 kg)  09/29/21 294 lb (133.4 kg)    GEN: Well nourished, well developed in no acute distress NECK: No JVD; No carotid bruits CARDIAC: RRR, no murmurs, rubs, gallops RESPIRATORY:  Clear to auscultation without rales, wheezing or rhonchi  ABDOMEN: Soft, non-tender, non-distended EXTREMITIES:  No edema; No deformity   ASSESSMENT AND PLAN  Persistent Afib The patient is in NSR on EKG. He quit taking Eliquis  because he "feels he doesn't need it". He has not wanted to be on BB in the past. I will check CMET, BMET and lipid panel.   Chronic HFrEF with recovered EF Patient is euvolemic on exam. Echo in 08/2019 showed LVEF 60-65%, G2DD.  He self-discontinued losartan  in the past.   HCM Patient denies pre-syncope or syncope. He reports he works out 3 times a week and has no issues with this. Echo in 2021 showed LVEF 60-65%, severe LVH with septal wall measuring up to 20mm with SAM causing  LVOT, LVOT gradient . He has refused cMRI and EP referral in the past, and again is not interested today. Risk of HMC have been discussed with the patient and he is aware. IT has also been recommended his son is appropriately screened.   HLD LDL 152. Patient reports diet and exercise. Repeat lipid panel today.    Dispo: Follow-up in 1 year  Signed, Cierra Rothgeb Rebekah Canada, PA-C

## 2023-07-15 LAB — COMPREHENSIVE METABOLIC PANEL WITH GFR
ALT: 18 IU/L (ref 0–44)
AST: 23 IU/L (ref 0–40)
Albumin: 4.3 g/dL (ref 3.8–4.9)
Alkaline Phosphatase: 97 IU/L (ref 44–121)
BUN/Creatinine Ratio: 12 (ref 9–20)
BUN: 14 mg/dL (ref 6–24)
Bilirubin Total: 0.6 mg/dL (ref 0.0–1.2)
CO2: 24 mmol/L (ref 20–29)
Calcium: 9.3 mg/dL (ref 8.7–10.2)
Chloride: 102 mmol/L (ref 96–106)
Creatinine, Ser: 1.16 mg/dL (ref 0.76–1.27)
Globulin, Total: 2.8 g/dL (ref 1.5–4.5)
Glucose: 87 mg/dL (ref 70–99)
Potassium: 4.9 mmol/L (ref 3.5–5.2)
Sodium: 140 mmol/L (ref 134–144)
Total Protein: 7.1 g/dL (ref 6.0–8.5)
eGFR: 73 mL/min/{1.73_m2} (ref >59)

## 2023-07-15 LAB — CBC
Hematocrit: 47.2 % (ref 37.5–51.0)
Hemoglobin: 16.1 g/dL (ref 13.0–17.7)
MCH: 30.7 pg (ref 26.6–33.0)
MCHC: 34.1 g/dL (ref 31.5–35.7)
MCV: 90 fL (ref 79–97)
Platelets: 250 10*3/uL (ref 150–450)
RBC: 5.25 x10E6/uL (ref 4.14–5.80)
RDW: 13.3 % (ref 11.6–15.4)
WBC: 6.1 10*3/uL (ref 3.4–10.8)

## 2023-07-15 LAB — LIPID PANEL
Chol/HDL Ratio: 5.6 ratio — ABNORMAL HIGH (ref 0.0–5.0)
Cholesterol, Total: 218 mg/dL — ABNORMAL HIGH (ref 100–199)
HDL: 39 mg/dL — ABNORMAL LOW (ref >39)
LDL Chol Calc (NIH): 157 mg/dL — ABNORMAL HIGH (ref 0–99)
Triglycerides: 123 mg/dL (ref 0–149)
VLDL Cholesterol Cal: 22 mg/dL (ref 5–40)

## 2023-07-15 LAB — LDL CHOLESTEROL, DIRECT: LDL Direct: 154 mg/dL — ABNORMAL HIGH (ref 0–99)

## 2023-07-17 ENCOUNTER — Ambulatory Visit: Payer: Self-pay

## 2023-09-24 ENCOUNTER — Encounter: Payer: Self-pay | Admitting: Emergency Medicine

## 2023-09-24 ENCOUNTER — Ambulatory Visit
Admission: EM | Admit: 2023-09-24 | Discharge: 2023-09-24 | Disposition: A | Discharge status: Home / Self Care | Attending: Emergency Medicine | Admitting: Emergency Medicine

## 2023-09-24 ENCOUNTER — Ambulatory Visit (INDEPENDENT_AMBULATORY_CARE_PROVIDER_SITE_OTHER)

## 2023-09-24 DIAGNOSIS — R5381 Other malaise: Secondary | ICD-10-CM

## 2023-09-24 MED ORDER — AMOXICILLIN-POT CLAVULANATE 875-125 MG PO TABS: 1.0000 | ORAL_TABLET | Freq: Two times a day (BID) | ORAL | 0 refills | Status: AC

## 2023-09-24 MED ORDER — ALBUTEROL SULFATE HFA 108 (90 BASE) MCG/ACT IN AERS: 2.0000 | INHALATION_SPRAY | RESPIRATORY_TRACT | 1 refills | Status: AC | PRN

## 2023-09-24 NOTE — Discharge Instructions (Addendum)
 Today you were evaluated for fatigue and malaise  Chest x-ray is pending you will be notified of results via telephone, additional medication may be sent to pharmacy based on results, it will be discussed further at that time  Begin Augmentin  twice daily for 7 days for bacterial coverage   You may use albuterol  inhaler taking 2 puffs every 4 hours as needed if shortness of breath returns  Ensure that you are staying well-hydrated primarily through water and electrolytes especially when in warm hot environments  Ensure over the next few days that you rest and take it easy  At any point if your symptoms were to worsen please go to the nearest emergency department for immediate evaluation  If your symptoms do not worsen but continued to persist please schedule follow-up appointment with your primary

## 2023-09-24 NOTE — ED Provider Notes (Signed)
 [name]    CSN: [id number] Arrival date & time: 09/24/23  1454      History   Chief Complaint No chief complaint on file.   HPI Kenneth Ferguson is a 58 y.o. male.   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  Endorses that 3 weeks ago he was experiencing cough and shortness of breath that resolved only 2 to 3 days ago.  Shortness of breath was with exertion and cough with nonproductive when symptoms initially began, completed telemetry doc visit and was prescribed azithromycin, prednisone and over-the-counter cough medicine, endorses he did not like the way prednisone made him feel, making it difficult for him to urinate.  Endorses still experiencing chest congestion at nighttime as if he cannot cough up any mucus.  Began to experience nausea this morning and had episode of vomiting after persistent coughing.  History of heart failure, A-fib, not currently taking medication.  Denies fever, wheezing, weight gain, abdominal swelling, leg swelling, dizziness, lightheadedness, chest pain or tightness.   Past Medical History:  Diagnosis Date   HFrEF (heart failure with reduced ejection fraction) (HCC)    a. 08/2018 Echo: EF 40-45% in setting of Afib; b. 11/2018 Echo: EF 25-30% w/ sev ant, antsept, and apical HK in setting of Afib w/ RVR; b. 08/2019 Echo: EF 60-65%.   HOCM (hypertrophic obstructive cardiomyopathy) (HCC)    a. Prev dx in early 2000s?; b. 08/2019 Echo: EF 60-65%, sev asym septal hypertrophy w/ septal wall of 20mm. SAM. LVOT grad of @ rest, w/ valsalva. Gr2 DD. Nl RV fxn. Mod dil LA. Mild MR.   Morbid obesity (HCC)    Persistent atrial fibrillation (HCC)    a. 11/2018 s/p TEE/DCCV. Maintained on Amio/Eliquis  (CHA2DS2VASc =1).   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 08/2018 TEE: LAA thrombus.    Patient Active Problem List    Diagnosis Date Noted   Mixed hyperlipidemia 09/29/2021   HFrEF (heart failure with reduced ejection fraction) (HCC) 11/18/2019   Hypertrophic cardiomyopathy (HCC) 11/18/2019   Essential hypertension 11/18/2019   Morbid obesity (HCC) 11/18/2019   Pericardial effusion 11/30/2018   Atrial fibrillation with RVR (HCC) 11/30/2018   Persistent atrial fibrillation (HCC)    Chronic HFrEF (heart failure with reduced ejection fraction) (HCC)    Atrial fibrillation with rapid ventricular response (HCC) 08/06/2018    Past Surgical History:  Procedure Laterality Date   CARDIOVERSION N/A 08/08/2018   Procedure: CARDIOVERSION;  Surgeon: Perla Evalene PARAS, MD;  Location: [facility];  Service: Cardiovascular;  Laterality: N/A;   CARDIOVERSION N/A 11/20/2018   Procedure: CARDIOVERSION;  Surgeon: Mady Bruckner, MD;  Location: [facility];  Service: Cardiovascular;  Laterality: N/A;   CARDIOVERSION N/A 11/20/2018   Procedure: CARDIOVERSION;  Surgeon: Mady Bruckner, MD;  Location: [facility];  Service: Cardiovascular;  Laterality: N/A;   CARDIOVERSION N/A 12/07/2018   Procedure: CARDIOVERSION;  Surgeon: Perla Evalene PARAS, MD;  Location: [facility];  Service: Cardiovascular;  Laterality: N/A;   TEE WITHOUT CARDIOVERSION N/A 08/08/2018   Procedure: TRANSESOPHAGEAL ECHOCARDIOGRAM (TEE);  Surgeon: Perla Evalene PARAS, MD;  Location: [facility];  Service: Cardiovascular;  Laterality: N/A;   TEE WITHOUT CARDIOVERSION N/A 11/20/2018   Procedure: TRANSESOPHAGEAL ECHOCARDIOGRAM (TEE);  Surgeon: Mady Bruckner, MD;  Location: [facility];  Service: Cardiovascular;  Laterality: N/A;       Home Medications    Prior  to Admission medications   Medication Sig Start Date End Date Taking? Authorizing Provider  albuterol  (VENTOLIN  HFA) 108 (90 Base) MCG/ACT inhaler Inhale 2 puffs into the lungs every 4 (four) hours as needed for wheezing or shortness of breath. 09/24/23  Yes Alexxis Mackert, Shelba SAUNDERS, NP  amoxicillin -clavulanate  (AUGMENTIN ) 875-125 MG tablet Take 1 tablet by mouth every 12 (twelve) hours. 09/24/23  Yes Samayah Novinger R, NP  apixaban  (ELIQUIS ) 5 MG TABS tablet TAKE 1 TABLET(5 MG) BY MOUTH TWICE DAILY 06/25/21   End, Lonni, MD    Family History Family History  Problem Relation Age of Onset   Emphysema Mother    Cancer Father     Social History Social History   Tobacco Use   Smoking status: Never   Smokeless tobacco: Never  Vaping Use   Vaping status: Never Used  Substance Use Topics   Alcohol use: Never   Drug use: Never     Allergies   Patient has no known allergies.   Review of Systems Review of Systems   Physical Exam Triage Vital Signs ED Triage Vitals  Encounter Vitals Group     BP 09/24/23 1527 100/76     Girls Systolic BP Percentile --      Girls Diastolic BP Percentile --      Boys Systolic BP Percentile --      Boys Diastolic BP Percentile --      Pulse Rate 09/24/23 1527 92     Resp 09/24/23 1527 20     Temp 09/24/23 1527 97.8 F (36.6 C)     Temp Source 09/24/23 1527 Oral     SpO2 09/24/23 1527 97 %     Weight --      Height --      Head Circumference --      Peak Flow --      Pain Score 09/24/23 1525 0     Pain Loc --      Pain Education --      Exclude from Growth Chart --    No data found.  Updated Vital Signs BP 100/76 (BP Location: Right Arm)   Pulse 92   Temp 97.8 F (36.6 C) (Oral)   Resp 20   SpO2 97%   Visual Acuity Right Eye Distance:   Left Eye Distance:   Bilateral Distance:    Right Eye Near:   Left Eye Near:    Bilateral Near:     Physical Exam Constitutional:      Appearance: He is ill-appearing.  Eyes:     Extraocular Movements: Extraocular movements intact.  Cardiovascular:     Rate and Rhythm: Normal rate and regular rhythm.     Pulses: Normal pulses.     Heart sounds: Normal heart sounds.  Pulmonary:     Effort: Pulmonary effort is normal.     Breath sounds: Normal breath sounds.  Neurological:      Mental Status: He is alert and oriented to person, place, and time. Mental status is at baseline.      UC Treatments / Results  Labs (all labs ordered are listed, but only abnormal results are displayed) Labs Reviewed - No data to display  EKG   Radiology No results found.  Procedures Procedures (including critical care time)  Medications Ordered in UC Medications - No data to display  Initial Impression / Assessment and Plan / UC Course  I have reviewed the triage vital signs and the nursing notes.  Pertinent labs &  imaging results that were available during my care of the patient were reviewed by me and considered in my medical decision making (see chart for details).  Malaise  Vital signs are stable, O2 saturation 97% on room air, while patient is appearing fatigued he is in no signs distress nontoxic, stable for outpatient management, chest x-ray is pending, empirically placed on Augmentin  and prescribed albuterol  inhaler as an alternative to steroids, recommended over-the-counter medications and nonpharmacological supportive care May follow-up with urgent care as needed Final Clinical Impressions(s) / UC Diagnoses   Final diagnoses:  Malaise     Discharge Instructions      Today you were evaluated for fatigue and malaise  Chest x-ray is pending you will be notified of results via telephone, additional medication may be sent to pharmacy based on results, it will be discussed further at that time  Begin Augmentin  twice daily for 7 days for bacterial coverage   You may use albuterol  inhaler taking 2 puffs every 4 hours as needed if shortness of breath returns  Ensure that you are staying well-hydrated primarily through water and electrolytes especially when in warm hot environments  Ensure over the next few days that you rest and take it easy  At any point if your symptoms were to worsen please go to the nearest emergency department for immediate evaluation  If  your symptoms do not worsen but continued to persist please schedule follow-up appointment with your primary   ED Prescriptions     Medication Sig Dispense Auth. Provider   amoxicillin -clavulanate (AUGMENTIN ) 875-125 MG tablet Take 1 tablet by mouth every 12 (twelve) hours. 14 tablet Clemente Dewey R, NP   albuterol  (VENTOLIN  HFA) 108 (90 Base) MCG/ACT inhaler Inhale 2 puffs into the lungs every 4 (four) hours as needed for wheezing or shortness of breath. 18 g Teresa Shelba SAUNDERS, NP      PDMP not reviewed this encounter.   Teresa Shelba SAUNDERS, NP 09/24/23 1555

## 2023-09-24 NOTE — ED Triage Notes (Signed)
 Patient complains of cough and SOB that started 3 weeks ago. Patient did an e-visit place on antibiotics and sterids and cough meds. Ws getting better and work long shift and started getting fatigue.

## 2023-09-25 ENCOUNTER — Ambulatory Visit (HOSPITAL_COMMUNITY): Payer: Self-pay

## 2023-09-25 ENCOUNTER — Telehealth: Payer: Self-pay

## 2023-09-25 NOTE — Telephone Encounter (Signed)
 Patient called requesting work note. Note provided and sent to patient's MyChart per patient request. Advised to call back if he encounters any issues.   Verbalized understanding.

## 2023-09-28 ENCOUNTER — Inpatient Hospital Stay
Admission: EM | Admit: 2023-09-28 | Discharge: 2023-09-30 | DRG: 308 | Discharge status: Against Medical Advice | Payer: PRIVATE HEALTH INSURANCE | Attending: Internal Medicine | Admitting: Internal Medicine

## 2023-09-28 ENCOUNTER — Encounter: Payer: Self-pay | Admitting: Emergency Medicine

## 2023-09-28 ENCOUNTER — Emergency Department: Payer: PRIVATE HEALTH INSURANCE

## 2023-09-28 ENCOUNTER — Emergency Department

## 2023-09-28 ENCOUNTER — Other Ambulatory Visit: Payer: Self-pay

## 2023-09-28 DIAGNOSIS — Z809 Family history of malignant neoplasm, unspecified: Secondary | ICD-10-CM

## 2023-09-28 DIAGNOSIS — I480 Paroxysmal atrial fibrillation: Secondary | ICD-10-CM

## 2023-09-28 DIAGNOSIS — I4819 Other persistent atrial fibrillation: Principal | ICD-10-CM | POA: Diagnosis present

## 2023-09-28 DIAGNOSIS — I5032 Chronic diastolic (congestive) heart failure: Secondary | ICD-10-CM

## 2023-09-28 DIAGNOSIS — Z7901 Long term (current) use of anticoagulants: Secondary | ICD-10-CM

## 2023-09-28 DIAGNOSIS — J189 Pneumonia, unspecified organism: Secondary | ICD-10-CM | POA: Diagnosis present

## 2023-09-28 DIAGNOSIS — R451 Restlessness and agitation: Secondary | ICD-10-CM | POA: Diagnosis present

## 2023-09-28 DIAGNOSIS — I3139 Other pericardial effusion (noninflammatory): Secondary | ICD-10-CM | POA: Diagnosis present

## 2023-09-28 DIAGNOSIS — Z1152 Encounter for screening for COVID-19: Secondary | ICD-10-CM

## 2023-09-28 DIAGNOSIS — I513 Intracardiac thrombosis, not elsewhere classified: Secondary | ICD-10-CM | POA: Diagnosis present

## 2023-09-28 DIAGNOSIS — Z5329 Procedure and treatment not carried out because of patient's decision for other reasons: Secondary | ICD-10-CM | POA: Diagnosis present

## 2023-09-28 DIAGNOSIS — I4891 Unspecified atrial fibrillation: Secondary | ICD-10-CM | POA: Diagnosis present

## 2023-09-28 DIAGNOSIS — I5023 Acute on chronic systolic (congestive) heart failure: Secondary | ICD-10-CM | POA: Diagnosis not present

## 2023-09-28 DIAGNOSIS — I5042 Chronic combined systolic (congestive) and diastolic (congestive) heart failure: Secondary | ICD-10-CM | POA: Diagnosis present

## 2023-09-28 DIAGNOSIS — Z79899 Other long term (current) drug therapy: Secondary | ICD-10-CM

## 2023-09-28 DIAGNOSIS — I340 Nonrheumatic mitral (valve) insufficiency: Secondary | ICD-10-CM | POA: Diagnosis present

## 2023-09-28 DIAGNOSIS — I422 Other hypertrophic cardiomyopathy: Secondary | ICD-10-CM

## 2023-09-28 DIAGNOSIS — I959 Hypotension, unspecified: Secondary | ICD-10-CM | POA: Diagnosis present

## 2023-09-28 DIAGNOSIS — I10 Essential (primary) hypertension: Secondary | ICD-10-CM | POA: Diagnosis not present

## 2023-09-28 DIAGNOSIS — Z8249 Family history of ischemic heart disease and other diseases of the circulatory system: Secondary | ICD-10-CM

## 2023-09-28 DIAGNOSIS — Z6841 Body Mass Index (BMI) 40.0 and over, adult: Secondary | ICD-10-CM

## 2023-09-28 DIAGNOSIS — I421 Obstructive hypertrophic cardiomyopathy: Secondary | ICD-10-CM | POA: Diagnosis present

## 2023-09-28 DIAGNOSIS — N179 Acute kidney failure, unspecified: Secondary | ICD-10-CM | POA: Diagnosis present

## 2023-09-28 DIAGNOSIS — E871 Hypo-osmolality and hyponatremia: Secondary | ICD-10-CM | POA: Diagnosis present

## 2023-09-28 DIAGNOSIS — R7989 Other specified abnormal findings of blood chemistry: Secondary | ICD-10-CM

## 2023-09-28 DIAGNOSIS — E785 Hyperlipidemia, unspecified: Secondary | ICD-10-CM

## 2023-09-28 DIAGNOSIS — Z91148 Patient's other noncompliance with medication regimen for other reason: Secondary | ICD-10-CM

## 2023-09-28 DIAGNOSIS — Z825 Family history of asthma and other chronic lower respiratory diseases: Secondary | ICD-10-CM

## 2023-09-28 DIAGNOSIS — Z91128 Patient's intentional underdosing of medication regimen for other reason: Secondary | ICD-10-CM

## 2023-09-28 DIAGNOSIS — R918 Other nonspecific abnormal finding of lung field: Secondary | ICD-10-CM | POA: Diagnosis not present

## 2023-09-28 DIAGNOSIS — I110 Hypertensive heart disease with heart failure: Secondary | ICD-10-CM | POA: Diagnosis present

## 2023-09-28 DIAGNOSIS — T502X5A Adverse effect of carbonic-anhydrase inhibitors, benzothiadiazides and other diuretics, initial encounter: Secondary | ICD-10-CM | POA: Diagnosis present

## 2023-09-28 DIAGNOSIS — E782 Mixed hyperlipidemia: Secondary | ICD-10-CM | POA: Diagnosis present

## 2023-09-28 DIAGNOSIS — I5020 Unspecified systolic (congestive) heart failure: Secondary | ICD-10-CM | POA: Diagnosis not present

## 2023-09-28 LAB — LACTIC ACID, PLASMA
Lactic Acid, Venous: 2.1 mmol/L (ref 0.5–1.9)
Lactic Acid, Venous: 1.9 mmol/L (ref 0.5–1.9)

## 2023-09-28 LAB — CBC
HCT: 48 % (ref 39.0–52.0)
Hemoglobin: 15.7 g/dL (ref 13.0–17.0)
MCH: 29.7 pg (ref 26.0–34.0)
MCHC: 32.7 g/dL (ref 30.0–36.0)
MCV: 90.9 fL (ref 80.0–100.0)
Platelets: 241 K/uL (ref 150–400)
RBC: 5.28 MIL/uL (ref 4.22–5.81)
RDW: 15 % (ref 11.5–15.5)
WBC: 9.6 K/uL (ref 4.0–10.5)
nRBC: 0 % (ref 0.0–0.2)

## 2023-09-28 LAB — RESP PANEL BY RT-PCR (RSV, FLU A&B, COVID)  RVPGX2
Influenza A by PCR: NEGATIVE
Influenza B by PCR: NEGATIVE
Resp Syncytial Virus by PCR: NEGATIVE
SARS Coronavirus 2 by RT PCR: NEGATIVE

## 2023-09-28 LAB — TROPONIN I (HIGH SENSITIVITY)
Troponin I (High Sensitivity): 36 ng/L — ABNORMAL HIGH (ref <18)
Troponin I (High Sensitivity): 36 ng/L — ABNORMAL HIGH (ref <18)

## 2023-09-28 LAB — HEPATIC FUNCTION PANEL
ALT: 47 U/L — ABNORMAL HIGH (ref 0–44)
AST: 37 U/L (ref 15–41)
Albumin: 3.5 g/dL (ref 3.5–5.0)
Alkaline Phosphatase: 76 U/L (ref 38–126)
Bilirubin, Direct: 0.5 mg/dL — ABNORMAL HIGH (ref 0.0–0.2)
Indirect Bilirubin: 1.4 mg/dL — ABNORMAL HIGH (ref 0.3–0.9)
Total Bilirubin: 1.9 mg/dL — ABNORMAL HIGH (ref 0.0–1.2)
Total Protein: 6.9 g/dL (ref 6.5–8.1)

## 2023-09-28 LAB — BRAIN NATRIURETIC PEPTIDE: B Natriuretic Peptide: 714.9 pg/mL — ABNORMAL HIGH (ref 0.0–100.0)

## 2023-09-28 LAB — APTT: aPTT: 30 s (ref 24–36)

## 2023-09-28 LAB — BASIC METABOLIC PANEL WITH GFR
Anion gap: 12 (ref 5–15)
BUN: 20 mg/dL (ref 6–20)
CO2: 18 mmol/L — ABNORMAL LOW (ref 22–32)
Calcium: 8.6 mg/dL — ABNORMAL LOW (ref 8.9–10.3)
Chloride: 104 mmol/L (ref 98–111)
Creatinine, Ser: 1.18 mg/dL (ref 0.61–1.24)
GFR, Estimated: >60 mL/min (ref >60)
Glucose, Bld: 104 mg/dL — ABNORMAL HIGH (ref 70–99)
Potassium: 4.1 mmol/L (ref 3.5–5.1)
Sodium: 134 mmol/L — ABNORMAL LOW (ref 135–145)

## 2023-09-28 LAB — PROTIME-INR
INR: 1.3 — ABNORMAL HIGH (ref 0.8–1.2)
Prothrombin Time: 16.6 s — ABNORMAL HIGH (ref 11.4–15.2)

## 2023-09-28 LAB — HEPARIN LEVEL (UNFRACTIONATED): Heparin Unfractionated: 0.44 [IU]/mL (ref 0.30–0.70)

## 2023-09-28 LAB — TSH: TSH: 2.08 u[IU]/mL (ref 0.350–4.500)

## 2023-09-28 LAB — CBG MONITORING, ED: Glucose-Capillary: 145 mg/dL — ABNORMAL HIGH (ref 70–99)

## 2023-09-28 LAB — MRSA NEXT GEN BY PCR, NASAL: MRSA by PCR Next Gen: NOT DETECTED

## 2023-09-28 LAB — T4, FREE: Free T4: 1.47 ng/dL — ABNORMAL HIGH (ref 0.61–1.12)

## 2023-09-28 LAB — PROCALCITONIN: Procalcitonin: <0.1 ng/mL

## 2023-09-28 LAB — MAGNESIUM: Magnesium: 2.3 mg/dL (ref 1.7–2.4)

## 2023-09-28 MED ORDER — SODIUM CHLORIDE 0.9 % IV SOLN: 500.0000 mg | INTRAVENOUS | Status: DC

## 2023-09-28 MED ORDER — SODIUM CHLORIDE 0.9 % IV SOLN
2.0000 g | Freq: Once | INTRAVENOUS | Status: AC
  Administered 2023-09-28: 2 g via INTRAVENOUS
  Filled 2023-09-28: qty 20

## 2023-09-28 MED ORDER — SODIUM CHLORIDE 0.9 % IV BOLUS
1000.0000 mL | Freq: Once | INTRAVENOUS | Status: AC
  Administered 2023-09-28: 1000 mL via INTRAVENOUS

## 2023-09-28 MED ORDER — ONDANSETRON HCL 4 MG PO TABS
4.0000 mg | ORAL_TABLET | Freq: Four times a day (QID) | ORAL | Status: DC | PRN
Start: 2023-09-28 — End: 2023-09-30

## 2023-09-28 MED ORDER — LIDOCAINE 5 % EX PTCH
1.0000 | MEDICATED_PATCH | CUTANEOUS | Status: DC
  Filled 2023-09-28: qty 2

## 2023-09-28 MED ORDER — SODIUM CHLORIDE 0.9 % IV SOLN
100.0000 mg | Freq: Two times a day (BID) | INTRAVENOUS | Status: DC
  Administered 2023-09-28 – 2023-09-29 (×2): 100 mg via INTRAVENOUS
  Filled 2023-09-28 (×3): qty 100

## 2023-09-28 MED ORDER — SODIUM CHLORIDE 0.9 % IV SOLN
100.0000 mg | Freq: Once | INTRAVENOUS | Status: AC
  Administered 2023-09-28: 100 mg via INTRAVENOUS
  Filled 2023-09-28: qty 100

## 2023-09-28 MED ORDER — SODIUM CHLORIDE 0.9 % IV SOLN
2.0000 g | INTRAVENOUS | Status: DC
  Administered 2023-09-29 – 2023-09-30 (×2): 2 g via INTRAVENOUS
  Filled 2023-09-28 (×2): qty 20

## 2023-09-28 MED ORDER — GUAIFENESIN-DM 100-10 MG/5ML PO SYRP
5.0000 mL | ORAL_SOLUTION | ORAL | Status: DC | PRN
  Administered 2023-09-29: 5 mL via ORAL
  Filled 2023-09-28: qty 10

## 2023-09-28 MED ORDER — SODIUM CHLORIDE 0.9 % IV BOLUS
250.0000 mL | Freq: Once | INTRAVENOUS | Status: AC
  Administered 2023-09-28: 250 mL via INTRAVENOUS

## 2023-09-28 MED ORDER — DILTIAZEM HCL-DEXTROSE 125-5 MG/125ML-% IV SOLN (PREMIX)
5.0000 mg/h | INTRAVENOUS | Status: DC
  Administered 2023-09-28: 5 mg/h via INTRAVENOUS
  Administered 2023-09-29: 10 mg/h via INTRAVENOUS
  Filled 2023-09-28 (×3): qty 125

## 2023-09-28 MED ORDER — ACETAMINOPHEN 325 MG PO TABS: 650.0000 mg | ORAL_TABLET | Freq: Four times a day (QID) | ORAL | Status: DC | PRN

## 2023-09-28 MED ORDER — FUROSEMIDE 10 MG/ML IJ SOLN
40.0000 mg | Freq: Once | INTRAMUSCULAR | Status: AC
  Administered 2023-09-28: 40 mg via INTRAVENOUS
  Filled 2023-09-28: qty 4

## 2023-09-28 MED ORDER — BISOPROLOL FUMARATE 5 MG PO TABS
5.0000 mg | ORAL_TABLET | Freq: Two times a day (BID) | ORAL | Status: DC
  Administered 2023-09-28 – 2023-09-30 (×5): 5 mg via ORAL
  Filled 2023-09-28 (×6): qty 1

## 2023-09-28 MED ORDER — TRAZODONE HCL 50 MG PO TABS
25.0000 mg | ORAL_TABLET | Freq: Once | ORAL | Status: AC
  Administered 2023-09-28: 25 mg via ORAL
  Filled 2023-09-28: qty 1

## 2023-09-28 MED ORDER — ACETAMINOPHEN 325 MG PO TABS
650.0000 mg | ORAL_TABLET | Freq: Four times a day (QID) | ORAL | Status: DC | PRN
Start: 2023-09-28 — End: 2023-09-30

## 2023-09-28 MED ORDER — AMIODARONE HCL IN DEXTROSE 360-4.14 MG/200ML-% IV SOLN: 30.0000 mg/h | INTRAVENOUS | Status: DC

## 2023-09-28 MED ORDER — HEPARIN BOLUS VIA INFUSION
5000.0000 [IU] | Freq: Once | INTRAVENOUS | Status: AC
  Administered 2023-09-28: 5000 [IU] via INTRAVENOUS
  Filled 2023-09-28: qty 5000

## 2023-09-28 MED ORDER — HEPARIN (PORCINE) 25000 UT/250ML-% IV SOLN
1400.0000 [IU]/h | INTRAVENOUS | Status: DC
  Administered 2023-09-28 – 2023-09-29 (×2): 1400 [IU]/h via INTRAVENOUS
  Filled 2023-09-28 (×3): qty 250

## 2023-09-28 MED ORDER — IOHEXOL 350 MG/ML SOLN
75.0000 mL | Freq: Once | INTRAVENOUS | Status: AC | PRN
  Administered 2023-09-28: 75 mL via INTRAVENOUS

## 2023-09-28 MED ORDER — HEPARIN SODIUM (PORCINE) 5000 UNIT/ML IJ SOLN
4000.0000 [IU] | Freq: Once | INTRAMUSCULAR | Status: DC
  Filled 2023-09-28: qty 1

## 2023-09-28 MED ORDER — AMIODARONE LOAD VIA INFUSION
150.0000 mg | Freq: Once | INTRAVENOUS | Status: DC
  Filled 2023-09-28: qty 83.34

## 2023-09-28 MED ORDER — LACTATED RINGERS IV BOLUS: 1000.0000 mL | Freq: Once | INTRAVENOUS | Status: DC

## 2023-09-28 MED ORDER — ONDANSETRON HCL 4 MG/2ML IJ SOLN
4.0000 mg | Freq: Four times a day (QID) | INTRAMUSCULAR | Status: DC | PRN
  Administered 2023-09-28: 4 mg via INTRAVENOUS
  Filled 2023-09-28: qty 2

## 2023-09-28 MED ORDER — AMIODARONE HCL IN DEXTROSE 360-4.14 MG/200ML-% IV SOLN
60.0000 mg/h | INTRAVENOUS | Status: DC
  Filled 2023-09-28: qty 200

## 2023-09-28 MED ORDER — CHLORHEXIDINE GLUCONATE CLOTH 2 % EX PADS
6.0000 | MEDICATED_PAD | Freq: Every day | CUTANEOUS | Status: DC
  Administered 2023-09-28 – 2023-09-30 (×3): 6 via TOPICAL

## 2023-09-28 MED ORDER — ACETAMINOPHEN 650 MG RE SUPP: 650.0000 mg | Freq: Four times a day (QID) | RECTAL | Status: DC | PRN

## 2023-09-28 MED ORDER — DILTIAZEM LOAD VIA INFUSION
15.0000 mg | Freq: Once | INTRAVENOUS | Status: AC
  Administered 2023-09-28: 15 mg via INTRAVENOUS
  Filled 2023-09-28 (×2): qty 15

## 2023-09-28 MED ORDER — ALBUTEROL SULFATE (2.5 MG/3ML) 0.083% IN NEBU
2.5000 mg | INHALATION_SOLUTION | RESPIRATORY_TRACT | Status: DC | PRN
  Filled 2023-09-28: qty 3

## 2023-09-28 MED ORDER — ALBUTEROL SULFATE HFA 108 (90 BASE) MCG/ACT IN AERS: 2.0000 | INHALATION_SPRAY | RESPIRATORY_TRACT | Status: DC | PRN

## 2023-09-28 MED ORDER — LABETALOL HCL 5 MG/ML IV SOLN: 5.0000 mg | INTRAVENOUS | Status: DC | PRN

## 2023-09-28 MED ORDER — BISOPROLOL FUMARATE 5 MG PO TABS
5.0000 mg | ORAL_TABLET | Freq: Four times a day (QID) | ORAL | Status: DC
  Filled 2023-09-28: qty 1

## 2023-09-28 MED ORDER — METOPROLOL TARTRATE 5 MG/5ML IV SOLN
5.0000 mg | INTRAVENOUS | Status: DC | PRN
  Administered 2023-09-30: 5 mg via INTRAVENOUS
  Filled 2023-09-28: qty 5

## 2023-09-28 MED ORDER — LACTATED RINGERS IV SOLN: 150.0000 mL/h | INTRAVENOUS | Status: AC

## 2023-09-28 NOTE — ED Notes (Signed)
 Lab called to send phlebotomist to collect 2nd set of blood cultures.

## 2023-09-28 NOTE — Hospital Course (Addendum)
 [name] is a 58 year old male with history of cholecystectomy, atrial fibrillation with history of appendage thrombus in July 2020, hocm, morbid obesity, medication noncompliance, heart failure preserved ejection fraction, hypertrophic cardiomyopathy, history of possible Takotsubo after the sudden loss of his spouse in 2020, who presents emergency department for chief concerns of shortness of breath.  Patient endorsed to EDP that he has not been taking his heart failure and/or atrial fibrillation medication.  This was also noted on prior outpatient cardiology note.  Vitals in the ED showed t 97.5, rr 21, heart rate 178, blood pressure 141/94, SpO2 of 98% on room air.  Serum sodium is 134, potassium 4.1, chloride 104, bicarb 18, BUN of 20, serum creatinine 1.18, EGFR greater than 60, nonfasting blood glucose 104, WBC 9.6, hemoglobin 15.7, platelets of 241. AST is 37, ALT is 47.  BNP is 714.9.  HS troponin is 36. Magnesium  is 2.3, TSH is 2.080, procalcitonin less than 0.1. CXR with persistent patchy density in the right mid to upper chest, no new finding. CTA chest with no PE, multiple patchy airspace opacities in right upper and lower lobes most consistent with multifocal pneumonia, small right pleural effusion and a small pericardial effusion was also noted.  ED treatment: Diltiazem  15 mg bolus one-time dose, diltiazem  gtt. heparin  GTT were initiated.  Ceftriaxone  2 g IV one-time dose, sodium chloride  250 mL one-time bolus, doxycycline  100 mg IV one-time dose.  8/22: Vitals with mildly elevated blood pressure and heart rate with some improvement and now in high 90s.  Troponin mildly positive with a flat curve, preliminary blood cultures negative.  Mild hyponatremia at 133, CO2 of 19, AKI with creatinine at 1.66 with baseline around 1.  Patient received a dose of IV Lasix  yesterday. Echocardiogram with EF of 25 to 30%, global hypokinesis and indeterminate diastolic parameter.  Also shows  moderately reduced right ventricular systolic function, severely dilated left atrium, IVC dilated.  Cardizem  was discontinued due to reduced EF.  Cardiology is on board.  Also has concern of sleep apnea-patient is morbidly obese and need outpatient sleep study for further evaluation.  8/23: Hemodynamically stable but heart rate remained elevated, very limited choices for his A-fib with RVR.  Likely will need TEE followed by DCCV.  Improving creatinine now at 1.53. Patient was also threatening to leave AMA, resistant to medical advice.  Need to at least take Eliquis  per cardiology.

## 2023-09-28 NOTE — Progress Notes (Signed)
 CODE SEPSIS - PHARMACY COMMUNICATION  **Broad Spectrum Antibiotics should be administered within 1 hour of Sepsis diagnosis**  Time Code Sepsis Called/Page Received: 1307  Antibiotics Ordered: ceftriaxone , doxycycline    Time of 1st antibiotic administration: 1254  Additional action taken by pharmacy: N/A  If necessary, Name of Provider/Nurse Contacted: N/A   Estill CHRISTELLA Lutes, PharmD, BCPS Clinical Pharmacist 09/28/2023 1:12 PM

## 2023-09-28 NOTE — Progress Notes (Signed)
 PHARMACY - ANTICOAGULATION CONSULT NOTE  Pharmacy Consult for Heparin  Infusion  Indication: atrial fibrillation  No Known Allergies  Patient Measurements: Height: 5' 8 (172.7 cm) Weight: 121.6 kg (268 lb) IBW/kg (Calculated) : 68.4 HEPARIN  DW (KG): 96.3  Vital Signs: BP: 113/81 (08/21 1300) Pulse Rate: 76 (08/21 1300)  Labs: Recent Labs    09/28/23 1047 09/28/23 1114  HGB 15.7  --   HCT 48.0  --   PLT 241  --   APTT  --  30  CREATININE 1.18  --   TROPONINIHS 36*  --     Estimated Creatinine Clearance: 85.5 mL/min (by C-G formula based on SCr of 1.18 mg/dL).   Medical History: Past Medical History:  Diagnosis Date   HFrEF (heart failure with reduced ejection fraction) (HCC)    a. 08/2018 Echo: EF 40-45% in setting of Afib; b. 11/2018 Echo: EF 25-30% w/ sev ant, antsept, and apical HK in setting of Afib w/ RVR; b. 08/2019 Echo: EF 60-65%.   HOCM (hypertrophic obstructive cardiomyopathy) (HCC)    a. Prev dx in early 2000s?; b. 08/2019 Echo: EF 60-65%, sev asym septal hypertrophy w/ septal wall of 20mm. SAM. LVOT grad of @ rest, w/ valsalva. Gr2 DD. Nl RV fxn. Mod dil LA. Mild MR.   Morbid obesity (HCC)    Persistent atrial fibrillation (HCC)    a. 11/2018 s/p TEE/DCCV. Maintained on Amio/Eliquis  (CHA2DS2VASc =1).   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 08/2018 TEE: LAA thrombus.    Medications:  No AC Prior to Admission- Per patient, he has not taken Eliquis  for years.   Assessment: 58 yo male admitted with SOB, fatigue and palpitations. Patient has PMH of hypertrophic cardiomyopathy, CHF, and A. Fib (Not taking AC prior to admission). Pharmacy has been consulted for heparin  infusion dosing for A. Fib.   Goal of Therapy:  Heparin  level 0.3-0.7 units/ml Monitor platelets by anticoagulation protocol: Yes   Plan:  Give 5000 units bolus x 1 Start heparin  infusion at 1400 units/hr Check anti-Xa level in 6 hours  and daily while on heparin  Continue to monitor H&H and platelets  Estill CHRISTELLA Lutes, PharmD, BCPS Clinical Pharmacist 09/28/2023 1:22 PM

## 2023-09-28 NOTE — H&P (Addendum)
 History and Physical   Kenneth Ferguson FMW:[id number] DOB: 04/07/1965 DOA: 09/28/2023  PCP: Patient, [name]  Patient coming from: Home via POV  I have personally briefly reviewed patient's old medical records in [facility] EMR.  Chief Concern: Shortness of breath  HPI: [name] is a 58 year old male with history of cholecystectomy, atrial fibrillation with history of appendage thrombus in July 2020, hocm, morbid obesity, medication noncompliance, heart failure preserved ejection fraction, hypertrophic cardiomyopathy, history of possible Takotsubo after the sudden loss of his spouse in 2020, who presents emergency department for chief concerns of shortness of breath.  Patient endorsed to EDP that he has not been taking his heart failure and/or atrial fibrillation medication.  This was also noted on prior outpatient cardiology note.  Vitals in the ED showed t 97.5, rr 21, heart rate 178, blood pressure 141/94, SpO2 of 98% on room air.  Serum sodium is 134, potassium 4.1, chloride 104, bicarb 18, BUN of 20, serum creatinine 1.18, EGFR greater than 60, nonfasting blood glucose 104, WBC 9.6, hemoglobin 15.7, platelets of 241.  AST is 37, ALT is 47.  BNP is 714.9.  HS troponin is 36.  Magnesium  is 2.3, TSH is 2.080, procalcitonin was less than 0.1.  ED treatment: Diltiazem  15 mg bolus one-time dose, diltiazem  gtt. heparin  GTT were initiated.  Ceftriaxone  2 g IV one-time dose, sodium chloride  250 mL one-time bolus, doxycycline  100 mg IV one-time dose. -------------------------------------- At bedside, patient able to tell me his first and last name, age, location, current calendar year.  Patient reports that he started having a nagging cough that is nonproductive that started about 3 weeks ago.  He reports no fever, chills, known sick contacts.  He did report that he started a new job about 3 weeks ago and has been having more stress lately.  He reports no matter how hard he tries he  was not able to get anything up.  He denies chest pain, shortness of breath, abdominal pain, dysuria, hematuria, diarrhea.  Of note, he states that he has not been taking Eliquis  for about 3 years.  He reports that over 3 years ago, he has not been in atrial fibrillation and therefore he discontinued the medication.  He reports that he saw his PCP about 3 months ago and he was in sinus rhythm.  He denies known trauma to his person.   Social history: He lives at home.  He is a widower.  He denies tobacco, EtOH, recreational drug use. He works as a Radiation protection practitioner.  ROS: Constitutional: no weight change, no fever ENT/Mouth: no sore throat, no rhinorrhea Eyes: no eye pain, no vision changes Cardiovascular: no chest pain, no dyspnea,  no edema, no palpitations Respiratory: + cough, no sputum, no wheezing Gastrointestinal: no nausea, no vomiting, no diarrhea, no constipation Genitourinary: no urinary incontinence, no dysuria, no hematuria Musculoskeletal: no arthralgias, no myalgias Skin: no skin lesions, no pruritus, Neuro: + weakness, no loss of consciousness, no syncope Psych: no anxiety, no depression, + decrease appetite Heme/Lymph: no bruising, no bleeding  ED Course: Discussed with EDP, patient required hospitalization for chief concerns of multilobar pneumonia and A-fib with RVR  Assessment/Plan  Principal Problem:   Multilobar lung infiltrate Active Problems:   Atrial fibrillation with rapid ventricular response (HCC)   Persistent atrial fibrillation (HCC)   Hypertrophic cardiomyopathy (HCC)   Essential hypertension   Morbid obesity (HCC)   Mixed hyperlipidemia   Heart failure with recovered ejection fraction (HFrecEF) (HCC)  Paroxysmal atrial fibrillation with RVR (HCC)   Assessment and Plan:  * Multilobar lung infiltrate Continue with doxycycline  100 mg IV twice daily, ceftriaxone  2 g IV daily to complete a 5-day course Incentive spirometry, flutter valve  Paroxysmal  atrial fibrillation with RVR (HCC) Patient currently maxed out on diltiazem  gtt. Appreciate further recommendation guidance by cardiology  Heart failure with recovered ejection fraction (HFrecEF) [facility]) Patient self discontinued losartan  Complete echo on 08/08/2019 showed estimated ejection fraction of 60 to 65%, grade 2 diastolic dysfunction  Morbid obesity (HCC) This complicates overall care and prognosis.   Essential hypertension Patient no longer on losartan  Labetalol  5 mg IV every 3 hours as needed for SBP >170, 4 doses ordered  Hypertrophic cardiomyopathy (HCC) Recommend continued close cardiology follow-up  Persistent atrial fibrillation [facility]) Patient has not been taking home Eliquis , therefore this has not been resumed on admission Continue with heparin  GGT  Chart reviewed.   DVT prophylaxis: Heparin  GTT Code Status: full code  Diet: Heart healthy Family Communication: updated son, [name] at bedside  Disposition Plan: Clinical course Consults called: Cardiology per EDP Admission status: Inpatient, stepdown  Past Medical History:  Diagnosis Date   HFrEF (heart failure with reduced ejection fraction) (HCC)    a. 08/2018 Echo: EF 40-45% in setting of Afib; b. 11/2018 Echo: EF 25-30% w/ sev ant, antsept, and apical HK in setting of Afib w/ RVR; b. 08/2019 Echo: EF 60-65%.   HOCM (hypertrophic obstructive cardiomyopathy) (HCC)    a. Prev dx in early 2000s?; b. 08/2019 Echo: EF 60-65%, sev asym septal hypertrophy w/ septal wall of 20mm. SAM. LVOT grad of @ rest, w/ valsalva. Gr2 DD. Nl RV fxn. Mod dil LA. Mild MR.   Morbid obesity (HCC)    Persistent atrial fibrillation (HCC)    a. 11/2018 s/p TEE/DCCV. Maintained on Amio/Eliquis  (CHA2DS2VASc =1).   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 08/2018 TEE: LAA thrombus.   Past Surgical History:  Procedure Laterality Date   CARDIOVERSION N/A 08/08/2018   Procedure: CARDIOVERSION;   Surgeon: Perla Evalene PARAS, MD;  Location: [facility] ORS;  Service: Cardiovascular;  Laterality: N/A;   CARDIOVERSION N/A 11/20/2018   Procedure: CARDIOVERSION;  Surgeon: Mady Bruckner, MD;  Location: [facility] ORS;  Service: Cardiovascular;  Laterality: N/A;   CARDIOVERSION N/A 11/20/2018   Procedure: CARDIOVERSION;  Surgeon: Mady Bruckner, MD;  Location: [facility] ORS;  Service: Cardiovascular;  Laterality: N/A;   CARDIOVERSION N/A 12/07/2018   Procedure: CARDIOVERSION;  Surgeon: Perla Evalene PARAS, MD;  Location: [facility] ORS;  Service: Cardiovascular;  Laterality: N/A;   TEE WITHOUT CARDIOVERSION N/A 08/08/2018   Procedure: TRANSESOPHAGEAL ECHOCARDIOGRAM (TEE);  Surgeon: Perla Evalene PARAS, MD;  Location: [facility] ORS;  Service: Cardiovascular;  Laterality: N/A;   TEE WITHOUT CARDIOVERSION N/A 11/20/2018   Procedure: TRANSESOPHAGEAL ECHOCARDIOGRAM (TEE);  Surgeon: Mady Bruckner, MD;  Location: [facility] ORS;  Service: Cardiovascular;  Laterality: N/A;   Social History:  reports that he has never smoked. He has never used smokeless tobacco. He reports that he does not drink alcohol and does not use drugs.  No Known Allergies Family History  Problem Relation Age of Onset   Emphysema Mother    Cancer Father    Heart disease Paternal Grandfather    Family history: Family history reviewed and not pertinent.  Prior to Admission medications   Medication Sig Start Date End Date Taking? Authorizing Provider  albuterol  (VENTOLIN  HFA) 108 (90 Base) MCG/ACT inhaler Inhale 2 puffs  into the lungs every 4 (four) hours as needed for wheezing or shortness of breath. 09/24/23  Yes White, Shelba SAUNDERS, NP  amoxicillin -clavulanate (AUGMENTIN ) 875-125 MG tablet Take 1 tablet by mouth every 12 (twelve) hours. 09/24/23  Yes Teresa Shelba SAUNDERS, NP  BROMFED DM 2-30-10 MG/5ML syrup Bromfed DM 2-30-10 mg/5 mL syrup 09/16/23  Yes [provider]  predniSONE (DELTASONE) 20 MG tablet Take 40 mg by mouth daily. 09/20/23  Yes [provider]  apixaban  (ELIQUIS ) 5 MG TABS tablet TAKE 1 TABLET(5 MG) BY MOUTH TWICE DAILY 06/25/21   End, Lonni, MD  azithromycin (ZITHROMAX) 250 MG tablet Take by mouth. Patient not taking: Reported on 09/28/2023 09/16/23   [provider]   Physical Exam: Vitals:   09/28/23 1445 09/28/23 1510 09/28/23 1511 09/28/23 1512  BP:  120/86    Pulse: 74 82 86 (!) 57  Resp: (!) 26 (!) 25 (!) 21 (!) 21  Temp:      TempSrc:      SpO2: 96% 100% 97% 99%  Weight:      Height:       Constitutional: appears age appropriate, NAD, calm Eyes: PERRL, lids and conjunctivae normal ENMT: Mucous membranes are moist. Posterior pharynx clear of any exudate or lesions. Age-appropriate dentition. Hearing appropriate Neck: normal, supple, no masses, no thyromegaly Respiratory: Decreased lung sound in right upper and lower lobes, no wheezing, no crackles. Normal respiratory effort. No accessory muscle use.  Cardiovascular: Regular rate and rhythm, no murmurs / rubs / gallops. No extremity edema. 2+ pedal pulses. No carotid bruits.  Abdomen: morbidly obese abdomen, no tenderness, no masses palpated, no hepatosplenomegaly. Bowel sounds positive.  Musculoskeletal: no clubbing / cyanosis. No joint deformity upper and lower extremities. Good ROM, no contractures, no atrophy. Normal muscle tone.  Skin: no rashes, lesions, ulcers. No induration Neurologic: Sensation intact. Strength 5/5 in all 4.  Psychiatric: Normal judgment and insight. Alert and oriented x 3. Normal mood.   EKG: independently reviewed, showing atrial fibrillation with a rate of 178, QTc 420  Chest x-ray on Admission: I personally reviewed and I agree with radiologist reading as below.  CT Angio Chest PE W and/or Wo Contrast Result Date: 09/28/2023 CLINICAL DATA:  Shortness of breath. EXAM: CT ANGIOGRAPHY CHEST WITH CONTRAST TECHNIQUE: Multidetector CT imaging of the chest was performed using the standard protocol during bolus  administration of intravenous contrast. Multiplanar CT image reconstructions and MIPs were obtained to evaluate the vascular anatomy. RADIATION DOSE REDUCTION: This exam was performed according to the departmental dose-optimization program which includes automated exposure control, adjustment of the mA and/or kV according to patient size and/or use of iterative reconstruction technique. CONTRAST:  75mL OMNIPAQUE  IOHEXOL  350 MG/ML SOLN COMPARISON:  Radiograph same day. FINDINGS: Cardiovascular: Satisfactory opacification of the pulmonary arteries to the segmental level. No evidence of pulmonary embolism. Small pericardial effusion is noted. Mild cardiomegaly is noted. Mediastinum/Nodes: No enlarged mediastinal, hilar, or axillary lymph nodes. Thyroid  gland, trachea, and esophagus demonstrate no significant findings. Lungs/Pleura: Small right pleural effusion is noted. No pneumothorax is noted. Multiple patchy airspace opacities are noted in the right upper and lower lobes most consistent with multifocal pneumonia. Left lung is clear. Upper Abdomen: No acute abnormality. Musculoskeletal: No chest wall abnormality. No acute or significant osseous findings. Review of the MIP images confirms the above findings. IMPRESSION: 1. No definite evidence of pulmonary embolus. 2. Multiple patchy airspace opacities are noted in the right upper and lower lobes most consistent with multifocal  pneumonia. 3. Small right pleural effusion is noted. 4. Small pericardial effusion is noted. Electronically Signed   By: Lynwood Landy Raddle M.D.   On: 09/28/2023 12:08   DG Chest Port 1 View Result Date: 09/28/2023 CLINICAL DATA:  Shortness of breath.  Recent diagnosis of pneumonia. EXAM: PORTABLE CHEST 1 VIEW COMPARISON:  09/24/2023 FINDINGS: Artifact overlies the chest. Cardiomegaly as seen previously. The left chest remains clear. Patchy/hazy density in the right mid to upper chest appears similar allowing for technical differences. There  may be slight radiographic improvement. No worsening or new finding. IMPRESSION: Persistent patchy/hazy density in the right mid to upper chest. There may be slight radiographic improvement. No worsening or new finding. Electronically Signed   By: Oneil Officer M.D.   On: 09/28/2023 11:24   Labs on Admission: I have personally reviewed following labs  CBC: Recent Labs  Lab 09/28/23 1047  WBC 9.6  HGB 15.7  HCT 48.0  MCV 90.9  PLT 241   Basic Metabolic Panel: Recent Labs  Lab 09/28/23 1047  NA 134*  K 4.1  CL 104  CO2 18*  GLUCOSE 104*  BUN 20  CREATININE 1.18  CALCIUM 8.6*  MG 2.3   GFR: Estimated Creatinine Clearance: 85.5 mL/min (by C-G formula based on SCr of 1.18 mg/dL).  Liver Function Tests: Recent Labs  Lab 09/28/23 1047  AST 37  ALT 47*  ALKPHOS 76  BILITOT 1.9*  PROT 6.9  ALBUMIN 3.5   Coagulation Profile: Recent Labs  Lab 09/28/23 1114  INR 1.3*   Thyroid  Function Tests: Recent Labs    09/28/23 1047  TSH 2.080  FREET4 1.47*   CRITICAL CARE Performed by: Dr. Sherre  Total critical care time: 32 minutes  Critical care time was exclusive of separately billable procedures and treating other patients.  Critical care was necessary to treat or prevent imminent or life-threatening deterioration.  Critical care was time spent personally by me on the following activities: development of treatment plan with patient as well as nursing, discussions with consultants, evaluation of patient's response to treatment, examination of patient, obtaining history from patient or surrogate, ordering and performing treatments and interventions, ordering and review of laboratory studies, ordering and review of radiographic studies, pulse oximetry and re-evaluation of patient's condition.  This document was prepared using Dragon Voice Recognition software and may include unintentional dictation errors.  Dr. Sherre [facility]  If 7PM-7AM, please contact  overnight-coverage provider If 7AM-7PM, please contact day attending provider [email or web address]  09/28/2023, 3:25 PM

## 2023-09-28 NOTE — Assessment & Plan Note (Signed)
 Patient self discontinued losartan  Complete echo on 08/08/2019 showed estimated ejection fraction of 60 to 65%, grade 2 diastolic dysfunction

## 2023-09-28 NOTE — Consult Note (Signed)
 Cardiology Consultation   Patient ID: Kenneth Ferguson MRN: [id number]; DOB: 1965-09-14  Admit date: 09/28/2023 Date of Consult: 09/28/2023  PCP:  Patient, [name]   [facility] Providers Cardiologist:  Lonni Hanson, MD  Electrophysiologist:  OLE ONEIDA HOLTS, MD       Patient Profile: Kenneth Ferguson is a 58 y.o. male with a hx of chronic heart with reduced EF with subsequent normalization (suspected tachy-mediated), persistent A-fib complicated by left atrial appendage thrombus on Eliquis , HCM, morbid obesity, and noncompliance who is being seen 09/28/2023 for the evaluation of rapid A-fib at the request of Dr. Nicholaus.  History of Present Illness: Kenneth Ferguson has a cardiac history dating back to early 2000s, when he was living in [place] .  He was reportedly found to have a murmur on routine exam and referred to cardiology.  It was determined that he had LVH.  Patient was diagnosed with A-fib in 2015 and underwent successful TEE guided cardioversion at an outside hospital.  Apparently diagnostic cath was performed and was told he had normal coronaries.  In 2018 he was admitted with recurrent A-fib and was diagnosed with hypertrophic cardiomyopathy ([facility]).  A LifeVest was placed, which she self discontinued and was subsequently lost to follow-up.  In May 2020, he suffered loss of his wife which resulted in acute stress reaction and recurrent A-fib.  He noted worsening dyspnea and bilateral lower leg edema.  He was seen again at an outside hospital diagnosed with A-fib with RVR and placed on a beta-blocker.  He was seen by cardiology in [place] in June 2020 with recommendation for follow through with echo, outpatient cardiac monitor cardiac and cardiac MRI.  In late June 2020, he presented to the [facility] with A-fib RVR and acute systolic heart failure.  Echo showed EF of 40 to 45%, moderately increased LV wall thickness.  TEE was performed with plan for cardioversion however left  atrial appendage thrombus was noted and decision was made to continue anticoagulation with cardioversion in the future.  He deferred scheduling for several months until October, at which time he presented with worsening heart failure symptoms and rapid A-fib requiring admission to the ICU.  He was started on amiodarone  and underwent TEE cardioversion.  He was seen in follow-up and repeat echocardiogram showed EF of 60 to 65%, severe LVH, systolic anterior motion of mitral valve causing LVOT obstruction with posterior directed mitral regurgitation.  He was contacted to discuss cardiac MRI at which time he agreed to, but never underwent.  He remained in normal sinus rhythm on Eliquis  and amiodarone .  Patient was referred to EP in 2021 to discuss antiarrhythmic, however patient continued to reschedule and was never seen.  Patient canceled or no-showed 19 times.  Amiodarone  was discontinued due to young age.  Patient self discontinued bisoprolol  due to making him feel  crappy.  Patient was last seen 07/14/2023 and was overall stable from a cardiac perspective.  He reported he stopped Eliquis  because he  feels he does not need it.  Patient was in normal sinus rhythm.  Cardiac was discussed, but patient was not interested.  The patient presented to the ER at [facility] 09/28/23 with SOB.  3 weeks ago he noted a cough and went to the PCP and was given antibiotics. Breathing got worse and he went to the ER at [facility] and he was given Augmentin . Heart rate was in the 90s. Breathing continued to worsen and he started feeling weak. He was having trouble walking  around at work. he had trouble taking deep breaths and decided to come in to the ER. He denies heart racing or palpitations.   In the ER BP 116/73, pulse 76bpm, RR 18. Labs showed sodium 134, Co2 18, ALT 47. HS trop 36. BNP 714, Mag 2.3, TSH 2.080. EKG showed Afib RVR 176bpm, nonspecific St changes. CXR showed persistent density in the right mid to upper chest. Chest CTA  showed no PE, multifocal PNA, small right pleural effusion, small pericardial effusion. He was started on IV dilt, IV heparin  and antibiotics and admitted for further work-up.    Past Medical History:  Diagnosis Date   HFrEF (heart failure with reduced ejection fraction) (HCC)    a. 08/2018 Echo: EF 40-45% in setting of Afib; b. 11/2018 Echo: EF 25-30% w/ sev ant, antsept, and apical HK in setting of Afib w/ RVR; b. 08/2019 Echo: EF 60-65%.   HOCM (hypertrophic obstructive cardiomyopathy) (HCC)    a. Prev dx in early 2000s?; b. 08/2019 Echo: EF 60-65%, sev asym septal hypertrophy w/ septal wall of 20mm. SAM. LVOT grad of @ rest, w/ valsalva. Gr2 DD. Nl RV fxn. Mod dil LA. Mild MR.   Morbid obesity (HCC)    Persistent atrial fibrillation (HCC)    a. 11/2018 s/p TEE/DCCV. Maintained on Amio/Eliquis  (CHA2DS2VASc =1).   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 08/2018 TEE: LAA thrombus.    Past Surgical History:  Procedure Laterality Date   CARDIOVERSION N/A 08/08/2018   Procedure: CARDIOVERSION;  Surgeon: Perla Evalene PARAS, MD;  Location: [facility] ORS;  Service: Cardiovascular;  Laterality: N/A;   CARDIOVERSION N/A 11/20/2018   Procedure: CARDIOVERSION;  Surgeon: Mady Bruckner, MD;  Location: [facility] ORS;  Service: Cardiovascular;  Laterality: N/A;   CARDIOVERSION N/A 11/20/2018   Procedure: CARDIOVERSION;  Surgeon: Mady Bruckner, MD;  Location: [facility] ORS;  Service: Cardiovascular;  Laterality: N/A;   CARDIOVERSION N/A 12/07/2018   Procedure: CARDIOVERSION;  Surgeon: Perla Evalene PARAS, MD;  Location: [facility] ORS;  Service: Cardiovascular;  Laterality: N/A;   TEE WITHOUT CARDIOVERSION N/A 08/08/2018   Procedure: TRANSESOPHAGEAL ECHOCARDIOGRAM (TEE);  Surgeon: Perla Evalene PARAS, MD;  Location: [facility] ORS;  Service: Cardiovascular;  Laterality: N/A;   TEE WITHOUT CARDIOVERSION N/A 11/20/2018   Procedure: TRANSESOPHAGEAL ECHOCARDIOGRAM (TEE);  Surgeon:  Mady Bruckner, MD;  Location: [facility] ORS;  Service: Cardiovascular;  Laterality: N/A;     Home Medications:  Prior to Admission medications   Medication Sig Start Date End Date Taking? Authorizing Provider  albuterol  (VENTOLIN  HFA) 108 (90 Base) MCG/ACT inhaler Inhale 2 puffs into the lungs every 4 (four) hours as needed for wheezing or shortness of breath. 09/24/23  Yes White, Shelba SAUNDERS, NP  amoxicillin -clavulanate (AUGMENTIN ) 875-125 MG tablet Take 1 tablet by mouth every 12 (twelve) hours. 09/24/23  Yes Teresa Shelba SAUNDERS, NP  BROMFED DM 2-30-10 MG/5ML syrup Bromfed DM 2-30-10 mg/5 mL syrup 09/16/23  Yes [provider]  predniSONE (DELTASONE) 20 MG tablet Take 40 mg by mouth daily. 09/20/23  Yes [provider]  apixaban  (ELIQUIS ) 5 MG TABS tablet TAKE 1 TABLET(5 MG) BY MOUTH TWICE DAILY 06/25/21   End, Bruckner, MD  azithromycin (ZITHROMAX) 250 MG tablet Take by mouth. Patient not taking: Reported on 09/28/2023 09/16/23   [provider]    Scheduled Meds:  bisoprolol   5 mg Oral BID   furosemide   40 mg Intravenous Once   Continuous Infusions:  [START ON 09/29/2023] cefTRIAXone  (ROCEPHIN )  IV     diltiazem  (CARDIZEM ) infusion 15 mg/hr (09/28/23 1327)   doxycycline  (VIBRAMYCIN ) IV 100 mg (09/28/23 1405)   doxycycline  (VIBRAMYCIN ) IV     heparin  1,400 Units/hr (09/28/23 1412)   lactated ringers  Stopped (09/28/23 1415)   PRN Meds: acetaminophen  **OR** acetaminophen , albuterol , metoprolol  tartrate, ondansetron  **OR** ondansetron  (ZOFRAN ) IV  Allergies:   No Known Allergies  Social History:   Social History   Socioeconomic History   Marital status: Widowed    Spouse name: Not on file   Number of children: Not on file   Years of education: Not on file   Highest education level: Not on file  Occupational History   Not on file  Tobacco Use   Smoking status: Never   Smokeless tobacco: Never  Vaping Use   Vaping status: Never Used  Substance and Sexual  Activity   Alcohol use: Never   Drug use: Never   Sexual activity: Not Currently  Other Topics Concern   Not on file  Social History Narrative   Not on file   Social Drivers of Health   Financial Resource Strain: Not on file  Food Insecurity: Not on file  Transportation Needs: Not on file  Physical Activity: Not on file  Stress: Not on file  Social Connections: Not on file  Intimate Partner Violence: Not on file    Family History:    Family History  Problem Relation Age of Onset   Emphysema Mother    Cancer Father    Heart disease Paternal Grandfather      ROS:  Please see the history of present illness.   All other ROS reviewed and negative.     Physical Exam/Data: Vitals:   09/28/23 1445 09/28/23 1510 09/28/23 1511 09/28/23 1512  BP:  120/86    Pulse: 74 82 86 (!) 57  Resp: (!) 26 (!) 25 (!) 21 (!) 21  Temp:      TempSrc:      SpO2: 96% 100% 97% 99%  Weight:      Height:       No intake or output data in the 24 hours ending 09/28/23 1545    09/28/2023   10:31 AM 07/14/2023   11:28 AM 06/02/2022   11:47 AM  Last 3 Weights  Weight (lbs) 268 lb 282 lb 6.4 oz 290 lb  Weight (kg) 121.564 kg 128.096 kg 131.543 kg     Body mass index is 40.75 kg/m.  General:  Well nourished, well developed, in no acute distress HEENT: normal Neck: no JVD Vascular: No carotid bruits; Distal pulses 2+ bilaterally Cardiac:  normal S1, S2; Irreg Irreg, tachy; no murmur  Lungs: decreased sounds  Abd: soft, nontender, no hepatomegaly  Ext: 1+ lower leg edema Musculoskeletal:  No deformities, BUE and BLE strength normal and equal Skin: warm and dry  Neuro:  CNs 2-12 intact, no focal abnormalities noted Psych:  Normal affect   EKG:  The EKG was personally reviewed and demonstrates:  Afib 178bpm, RAD, nonspecific ST changes Telemetry:  Telemetry was personally reviewed and demonstrates:  Afib HR 160-170s  Relevant CV Studies:  Echo TEE 2020 1. Left ventricular ejection  fraction, by visual estimation, is 20 to  25%. The left ventricle has severely decreased function. There is  moderately increased left ventricular hypertrophy.   2. Global right ventricle was not assessed.The right ventricular size is  not assessed. Right vetricular wall thickness was not assessed.   3. Left atrial size was not assessed.  4. There is no left atrial or left atrial appendage thrombus.   5. Right atrial size was not assessed.   6. Moderate pericardial effusion.   7. The pericardial effusion is circumferential.   8. The mitral valve is normal in structure. Moderate mitral valve  regurgitation.   9. The tricuspid valve is not assessed. Tricuspid valve regurgitation was  not assessed by color flow Doppler.  10. The aortic valve was not assessed Aortic valve regurgitation was not  assessed by color flow Doppler.  11. The pulmonic valve was not assessed. Pulmonic valve regurgitation was  not assessed by color flow Doppler.  12. Aortic root could not be assessed.  13. The interatrial septum was not assessed.   Echo limited 11/2018 1. Left ventricular ejection fraction, by visual estimation, is 25 to  30%. The left ventricle has moderate to severely decreased function.  Normal left ventricular size. There is moderately increased left  ventricular hypertrophy.Severe hypokinesis of the   anterior, anteroseptal and apical walls   2. Global right ventricle has moderately reduced systolic function.The  right ventricular size is normal. No increase in right ventricular wall  thickness.   3. Left atrial size was moderately dilated.   4. Mildly elevated pulmonary artery systolic pressure.   5. Arrhythmia noted, rate 133 bpm   Echo 08/2019 1. Patient has severe asymmetric septal hypertrophy with septal wall  measuring up to 20mm. There is systolic anterior motion (SAM) of mitral  valve causing LVOT obstruction with posteriorly directed mitral  regurgitation. LVOT gradient is  at rest  increasing to with valvsalva. Recommend Cardiac MR for further  assessment as findings are consistent with HCM.SABRA Left ventricular ejection  fraction, by estimation, is 60 to 65%. The left ventricle has normal  function. The left ventricle has no  regional wall motion abnormalities. There is severe asymmetric left  ventricular hypertrophy of the septal segment. Left ventricular diastolic  parameters are consistent with Grade II diastolic dysfunction  (pseudonormalization).   2. Right ventricular systolic function is normal. The right ventricular  size is mildly enlarged. There is moderately elevated pulmonary artery  systolic pressure.   3. Left atrial size was moderately dilated.   4. The mitral valve is grossly normal. Mild mitral valve regurgitation.   5. The aortic valve is tricuspid. Aortic valve regurgitation is not  visualized.   Laboratory Data: High Sensitivity Troponin:   Recent Labs  Lab 09/28/23 1047 09/28/23 1417  TROPONINIHS 36* 36*     Chemistry Recent Labs  Lab 09/28/23 1047  NA 134*  K 4.1  CL 104  CO2 18*  GLUCOSE 104*  BUN 20  CREATININE 1.18  CALCIUM 8.6*  MG 2.3  GFRNONAA >60  ANIONGAP 12    Recent Labs  Lab 09/28/23 1047  PROT 6.9  ALBUMIN 3.5  AST 37  ALT 47*  ALKPHOS 76  BILITOT 1.9*   Lipids No results for input(s): CHOL, TRIG, HDL, LABVLDL, LDLCALC, CHOLHDL in the last 168 hours.  Hematology Recent Labs  Lab 09/28/23 1047  WBC 9.6  RBC 5.28  HGB 15.7  HCT 48.0  MCV 90.9  MCH 29.7  MCHC 32.7  RDW 15.0  PLT 241   Thyroid   Recent Labs  Lab 09/28/23 1047  TSH 2.080  FREET4 1.47*    BNP Recent Labs  Lab 09/28/23 1047  BNP 714.9*    DDimer No results for input(s): DDIMER in the last 168 hours.  Radiology/Studies:  CT Angio Chest PE  W and/or Wo Contrast Result Date: 09/28/2023 CLINICAL DATA:  Shortness of breath. EXAM: CT ANGIOGRAPHY CHEST WITH CONTRAST TECHNIQUE: Multidetector CT  imaging of the chest was performed using the standard protocol during bolus administration of intravenous contrast. Multiplanar CT image reconstructions and MIPs were obtained to evaluate the vascular anatomy. RADIATION DOSE REDUCTION: This exam was performed according to the departmental dose-optimization program which includes automated exposure control, adjustment of the mA and/or kV according to patient size and/or use of iterative reconstruction technique. CONTRAST:  75mL OMNIPAQUE  IOHEXOL  350 MG/ML SOLN COMPARISON:  Radiograph same day. FINDINGS: Cardiovascular: Satisfactory opacification of the pulmonary arteries to the segmental level. No evidence of pulmonary embolism. Small pericardial effusion is noted. Mild cardiomegaly is noted. Mediastinum/Nodes: No enlarged mediastinal, hilar, or axillary lymph nodes. Thyroid  gland, trachea, and esophagus demonstrate no significant findings. Lungs/Pleura: Small right pleural effusion is noted. No pneumothorax is noted. Multiple patchy airspace opacities are noted in the right upper and lower lobes most consistent with multifocal pneumonia. Left lung is clear. Upper Abdomen: No acute abnormality. Musculoskeletal: No chest wall abnormality. No acute or significant osseous findings. Review of the MIP images confirms the above findings. IMPRESSION: 1. No definite evidence of pulmonary embolus. 2. Multiple patchy airspace opacities are noted in the right upper and lower lobes most consistent with multifocal pneumonia. 3. Small right pleural effusion is noted. 4. Small pericardial effusion is noted. Electronically Signed   By: Lynwood Landy Raddle M.D.   On: 09/28/2023 12:08   DG Chest Port 1 View Result Date: 09/28/2023 CLINICAL DATA:  Shortness of breath.  Recent diagnosis of pneumonia. EXAM: PORTABLE CHEST 1 VIEW COMPARISON:  09/24/2023 FINDINGS: Artifact overlies the chest. Cardiomegaly as seen previously. The left chest remains clear. Patchy/hazy density in the right mid  to upper chest appears similar allowing for technical differences. There may be slight radiographic improvement. No worsening or new finding. IMPRESSION: Persistent patchy/hazy density in the right mid to upper chest. There may be slight radiographic improvement. No worsening or new finding. Electronically Signed   By: Oneil Officer M.D.   On: 09/28/2023 11:24   DG Chest 2 View Result Date: 09/24/2023 CLINICAL DATA:  Fatigue.  Cough. EXAM: CHEST - 2 VIEW COMPARISON:  Radiograph 11/30/2018 FINDINGS: Chronic cardiomegaly. Patchy airspace disease in the right upper lung zone. No pleural effusion or pneumothorax. Mild vascular congestion on limited assessment, no acute osseous findings. IMPRESSION: 1. Patchy airspace disease in the right upper lung zone, suspicious for pneumonia. Recommend radiographic follow-up to resolution. 2. Chronic cardiomegaly. Electronically Signed   By: Andrea Gasman M.D.   On: 09/24/2023 16:21     Assessment and Plan:  Persistent Afib - admitted with multifocal PNA found to be in rapid afib. He was not on Eliquis  prior to admission - on IV dilt max dose - continue IV heparin  - rates are still high, trying to avoid amiodarone  due to lack of a/c prior to admission - Keep Mag>2 and K>4. TSH normal - check echo - Bps good, can add bisoprolol . Said he did not tolerate metoprolol  - can change to Eliquis  if echo is OK. He will need DCCV after 4 weeks of uninterrupted a/c. Discussed long-term a/c.  Elevated troponin - HS trop 36>36 - no chest pain reported - echo as above - IV heparin  for now - suspect supply demand mismatch.  - ischemic eval can be as outpatient  Small pericardial effusion - seen on chest CT - echo as above  Chronic HFimpEF -  h/o low EF suspected tachy-mediated - he reports chronic edema that is unchanged however BNP in the 700s - repeat echo - will given IV lasix  40mg  once  HCM - no pre-syncope or syncope reported - repeat echo  HTN - IV  dilt - start BB  HLD - LDL 154 - would benefit from statin therapy  CHA2DS2-VASc Score = 2   This indicates a 2.2% annual risk of stroke. The patient's score is based upon: CHF History: 1 HTN History: 1 Diabetes History: 0 Stroke History: 0 Vascular Disease History: 0 Age Score: 0 Gender Score: 0        For questions or updates, please contact [facility] Please consult [email or web address] for contact info under    Signed, Marijayne Rauth VEAR Fishman, PA-C  09/28/2023 3:45 PM

## 2023-09-28 NOTE — Assessment & Plan Note (Signed)
 Patient has not been taking home Eliquis , therefore this has not been resumed on admission Continue with heparin  GGT

## 2023-09-28 NOTE — Assessment & Plan Note (Signed)
 -  This complicates overall care and prognosis.

## 2023-09-28 NOTE — Progress Notes (Signed)
 Hospitalist and Dr. Gollan have been notified: the patient was brought from the ED to ICU 20. During the transition his blood pressure dropped in the 60's per ED nurse. Cardizem  drip running at 17.5 mg/hr was paused. It has been hard to get accurate readings but his blood pressure has been 140/120 ( MAP 128), the pt's heart rate has been between 80's to 130's at times. The Cardizem  is still paused at this time. The pt reports that he started to feel a change and feeling flushed after cardizem  drip was increased from 15 mg/hr to 17 mg/hr.   1840: Dr. Jackie has provided a verbal order to change Cardizem  drip rate to 10  mg/hr and titrate to 15 if needed.

## 2023-09-28 NOTE — ED Triage Notes (Signed)
 Pt to ED via POV for shortness of breath. Pt was diagnosed with pneumonia on Sunday. Pt has been taking antibiotics but states that she has not gotten any better. Pt states that he is having to sleep with 2 pillows at night because it is hard to breath. Pt is able to speak in complete sentences in triage without difficulty or distress. Pts respirations are equal and unlabored at  this time.

## 2023-09-28 NOTE — Progress Notes (Signed)
 PHARMACY - ANTICOAGULATION CONSULT NOTE  Pharmacy Consult for Heparin  Infusion  Indication: atrial fibrillation  No Known Allergies  Patient Measurements: Height: 5' 8 (172.7 cm) Weight: 131.9 kg (290 lb 12.6 oz) IBW/kg (Calculated) : 68.4 HEPARIN  DW (KG): 99.4  Vital Signs: Temp: 97.7 F (36.5 C) (08/21 1810) Temp Source: Axillary (08/21 1810) BP: 172/131 (08/21 2200) Pulse Rate: 83 (08/21 2200)  Labs: Recent Labs    09/28/23 1047 09/28/23 1114 09/28/23 1417 09/28/23 2203  HGB 15.7  --   --   --   HCT 48.0  --   --   --   PLT 241  --   --   --   APTT  --  30  --   --   LABPROT  --  16.6*  --   --   INR  --  1.3*  --   --   HEPARINUNFRC  --   --   --  0.44  CREATININE 1.18  --   --   --   TROPONINIHS 36*  --  36*  --     Estimated Creatinine Clearance: 89.4 mL/min (by C-G formula based on SCr of 1.18 mg/dL).   Medical History: Past Medical History:  Diagnosis Date   HFrEF (heart failure with reduced ejection fraction) (HCC)    a. 08/2018 Echo: EF 40-45% in setting of Afib; b. 11/2018 Echo: EF 25-30% w/ sev ant, antsept, and apical HK in setting of Afib w/ RVR; b. 08/2019 Echo: EF 60-65%.   HOCM (hypertrophic obstructive cardiomyopathy) (HCC)    a. Prev dx in early 2000s?; b. 08/2019 Echo: EF 60-65%, sev asym septal hypertrophy w/ septal wall of 20mm. SAM. LVOT grad of @ rest, w/ valsalva. Gr2 DD. Nl RV fxn. Mod dil LA. Mild MR.   Morbid obesity (HCC)    Persistent atrial fibrillation (HCC)    a. 11/2018 s/p TEE/DCCV. Maintained on Amio/Eliquis  (CHA2DS2VASc =1).   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 08/2018 TEE: LAA thrombus.    Medications:  No AC Prior to Admission- Per patient, he has not taken Eliquis  for years.   Assessment: 58 yo male admitted with SOB, fatigue and palpitations. Patient has PMH of hypertrophic cardiomyopathy, CHF, and A. Fib (Not taking AC prior to admission). Pharmacy has been  consulted for heparin  infusion dosing for A. Fib.   Goal of Therapy:  Heparin  level 0.3-0.7 units/ml Monitor platelets by anticoagulation protocol: Yes  8/21 @ 2203: HL 0.44  Therapeutic x 1   Plan:  Continue heparin  infusion at 1400 units/hr Check anti-Xa level in 6 hours and daily while on heparin  Continue to monitor H&H and platelets  Will M. Lenon, PharmD Clinical Pharmacist 09/28/2023 10:27 PM

## 2023-09-28 NOTE — ED Provider Notes (Addendum)
 [facility] Provider Note    Event Date/Time   First MD Initiated Contact with Patient 09/28/23 1058     (approximate)   History   Shortness of Breath   HPI  Kenneth Ferguson is a 58 y.o. male  with pmhy of hypertrophic cardiomyopathy, afib, CHF, known left atrial appendage thrombus who is supposed to be on chronic Eliquis  but has medication noncompliance followed by [facility] health cardiology (Dr. Mady) who presents with 3 weeks of cough and shortness of breath and general fatigue and 2 days of palpitations.  Patient states that he has exerted himself at work for the past month with a new job and when he initially developed symptoms he went and had a televisit health appointment and was prescribed azithromycin steroids and a cough syrup.  He went to urgent care facility yesterday and was diagnosed with pneumonia and was prescribed Augmentin  and given a breathing treatment.  He reports that his palpitations acutely worsened after the breathing treatment.  He denies chest pain or abdominal pain.  He initially tells me that he is taking Eliquis .  But then tells me that he is not.  He presents with his son who contributes to the history      Physical Exam   Triage Vital Signs: ED Triage Vitals [09/28/23 1031]  Encounter Vitals Group     BP 116/73     Girls Systolic BP Percentile      Girls Diastolic BP Percentile      Boys Systolic BP Percentile      Boys Diastolic BP Percentile      Pulse Rate 76     Resp 18     Temp      Temp src      SpO2 98 %     Weight 268 lb (121.6 kg)     Height 5' 8 (1.727 m)     Head Circumference      Peak Flow      Pain Score 0     Pain Loc      Pain Education      Exclude from Growth Chart     Most recent vital signs: Vitals:   09/28/23 1206 09/28/23 1207  BP: 95/64   Pulse: 83 (!) 46  Resp: (!) 26 (!) 24  SpO2: 92% 96%    Nursing Triage Note reviewed. Vital signs reviewed and patients oxygen saturation is  normoxic  General: Patient is well nourished, well developed, awake and alert, appears chronically ill Head: Normocephalic and atraumatic Eyes: Normal inspection, extraocular muscles intact, no conjunctival pallor Ear, nose, throat: Normal external exam Neck: Normal range of motion Respiratory: Patient is in no respiratory distress, lungs CTAB Cardiovascular: Patient is tachycardic, RR with murmur appreciated GI: Abd SNT with no guarding or rebound  Back: Normal inspection of the back with good strength and range of motion throughout all ext Extremities: pulses intact with good cap refills, no LE pitting edema or calf tenderness Neuro: The patient is alert and oriented to person, place, and time, appropriately conversive, with 5/5 bilat UE/LE strength, no gross motor or sensory defects noted. Coordination appears to be adequate. Skin: Warm, dry, and intact Psych: normal mood and affect, no SI or HI  ED Results / Procedures / Treatments   Labs (all labs ordered are listed, but only abnormal results are displayed) Labs Reviewed  BASIC METABOLIC PANEL WITH GFR - Abnormal; Notable for the following components:      Result Value  Sodium 134 (*)    CO2 18 (*)    Glucose, Bld 104 (*)    Calcium 8.6 (*)    All other components within normal limits  BRAIN NATRIURETIC PEPTIDE - Abnormal; Notable for the following components:   B Natriuretic Peptide 714.9 (*)    All other components within normal limits  HEPATIC FUNCTION PANEL - Abnormal; Notable for the following components:   ALT 47 (*)    Total Bilirubin 1.9 (*)    Bilirubin, Direct 0.5 (*)    Indirect Bilirubin 1.4 (*)    All other components within normal limits  T4, FREE - Abnormal; Notable for the following components:   Free T4 1.47 (*)    All other components within normal limits  TROPONIN I (HIGH SENSITIVITY) - Abnormal; Notable for the following components:   Troponin I (High Sensitivity) 36 (*)    All other components  within normal limits  RESP PANEL BY RT-PCR (RSV, FLU A&B, COVID)  RVPGX2  CULTURE, BLOOD (ROUTINE X 2)  CULTURE, BLOOD (ROUTINE X 2)  CBC  MAGNESIUM   TSH  APTT  PROCALCITONIN  LACTIC ACID, PLASMA  LACTIC ACID, PLASMA  TROPONIN I (HIGH SENSITIVITY)     EKG EKG and rhythm strip are interpreted by myself:   EKG: afib with rvr  at heart rate of 178, normal QRS duration, QTc 420, nonspecific ST segments and T waves no ectopy EKG not consistent with Acute STEMI Rhythm strip: afib with RVR in lead II   RADIOLOGY Xray chest: Opacity right chest on my independent review interpretation radiologist agrees CTA chest: Small pericardial effusion, multifocal pneumonia, no pulmonary embolism    PROCEDURES:  Critical Care performed: Yes, see critical care procedure note(s)  .Critical Care  Performed by: Nicholaus Rolland BRAVO, MD Authorized by: Nicholaus Rolland BRAVO, MD   Critical care provider statement:    Critical care time (minutes):  30   Critical care was necessary to treat or prevent imminent or life-threatening deterioration of the following conditions:  Cardiac failure and circulatory failure   Critical care was time spent personally by me on the following activities:  Development of treatment plan with patient or surrogate, discussions with consultants, evaluation of patient's response to treatment, examination of patient, ordering and review of laboratory studies, ordering and review of radiographic studies, ordering and performing treatments and interventions, pulse oximetry, re-evaluation of patient's condition and review of old charts   Care discussed with: admitting provider   Comments:     A-fib with RVR requiring bolus and Dilt drip with cardiology consultation    MEDICATIONS ORDERED IN ED: Medications  diltiazem  (CARDIZEM ) 1 mg/mL load via infusion 15 mg (15 mg Intravenous Bolus from Bag 09/28/23 1201)    And  diltiazem  (CARDIZEM ) 125 mg in dextrose  5% 125 mL (1 mg/mL)  infusion (10 mg/hr Intravenous Rate/Dose Change 09/28/23 1242)  cefTRIAXone  (ROCEPHIN ) 2 g in sodium chloride  0.9 % 100 mL IVPB (has no administration in time range)  doxycycline  (VIBRAMYCIN ) 100 mg in sodium chloride  0.9 % 250 mL IVPB (has no administration in time range)  heparin  injection 4,000 Units (has no administration in time range)  sodium chloride  0.9 % bolus 250 mL (250 mLs Intravenous New Bag/Given 09/28/23 1207)  iohexol  (OMNIPAQUE ) 350 MG/ML injection 75 mL (75 mLs Intravenous Contrast Given 09/28/23 1141)     IMPRESSION / MDM / ASSESSMENT AND PLAN / ED COURSE  Differential diagnosis includes, but is not limited to, arrhythmia, ACS, pulmonary embolism, electrolyte derangement, CHF, pneumonia, medication noncompliance  ED course: Patient presents acutely with A-fib with RVR.  He is not entirely forthcoming about taking his medications at this backtracked and history quite a bit.  Reassured that his blood pressure is holding.  Given his complicated heart history, cardiology was consulted who recommended starting the patient on a diltiazem  bolus and drip as it is unclear whether patient may have a blood clot still and this has been ordered (amiodarone  not recommended at this time).  Patient's CT scan is consistent with multifocal pneumonia.  I have given antibiotics for community-acquired pneumonia.  No profound electrolyte derangements but he does appear to have some heart failure.  Will discuss case for admission with hospitalist   Clinical Course as of 09/28/23 1250  Thu Sep 28, 2023  1119 Will reach out to [facility] health cardiology given history of HOCM, decreased LVEF to discuss best rate controlling medication [HD]  10-06-25 Case discussed with Dr. Perla of cardiology and given that his last recorded EF was in normal range he recommends starting the patient on Dilt bolus and drip instead of amiodarone  as onset of A-fib is unclear.  His team will consult on  this patient once we have more information back but agrees with likely admission today [HD]  06-Oct-1209 B Natriuretic Peptide(!): 714.9 Elevated [HD]  1211 Basic metabolic panel(!) Creatinine is within normal limits no significant derangements except for mild hypocalcemia [HD]  October 07, 1215 Magnesium : 2.3 Within normal limits [HD]  1217 Troponin I (High Sensitivity)(!): 36 Elevated [HD]  1250 I reviewed the indications benefits versus alternatives of initiating a heparin  drip with the patient and his son they both voiced understanding and opted to proceed.  Of note patient has no history of intracranial hemorrhage or GI bleeding [HD]    Clinical Course User Index [HD] Nicholaus Rolland BRAVO, MD   -- Data(2/3 categories following were performed): I reviewed or ordered at least three unique tests, external notes, and/or the history required an independent historian as one of the three requirements as following: At least 3 labs/imaging studies were obtained and/or reviewed. AND  I discussed the management of the patient with the following external physician or qualified healthcare provider: Admitting physician  Risk: This patient has a high risk of morbidity due to further diagnostic testing or treatment. Rationale: Decision made regarding admission  Admit Level 5 - Labs/Rads, Admit, Consult: Cardiology  Suggested E/M Coding Level: 5, 99285  This level has been selected based on the 06-Oct-2021 CPT guidelines for E/M codes in the Emergency Department based on 2/3 of the CoPA, Data, and Risk.   FINAL CLINICAL IMPRESSION(S) / ED DIAGNOSES   Final diagnoses:  Atrial fibrillation with rapid ventricular response (HCC)  Multifocal pneumonia     Rx / DC Orders   ED Discharge Orders     None        Note:  This document was prepared using Dragon voice recognition software and may include unintentional dictation errors.   Nicholaus Rolland BRAVO, MD 09/28/23 1248    Nicholaus Rolland BRAVO, MD 09/28/23 1250

## 2023-09-28 NOTE — Assessment & Plan Note (Signed)
 Patient has having cough for about 2 weeks. Continue with doxycycline  100 mg IV twice daily, ceftriaxone  2 g IV daily to complete a 5-day course Incentive spirometry, flutter valve

## 2023-09-28 NOTE — Assessment & Plan Note (Addendum)
 Recommend continued close cardiology follow-up

## 2023-09-28 NOTE — Assessment & Plan Note (Signed)
 Patient with history of recurrent atrial fibrillation and has stopped taking his medications himself. Heart rate initially improved with Cardizem  infusion which was discontinued due to decreased EF on echocardiogram. Cardiology is on board and trying to increase the dose of beta-blocker if that will help. Digoxin  might not be a good choice due to history of hocm.  Cardiology trying to avoid amiodarone  as patient was not taking any anticoagulation at home with history of prior left appendageal thrombus. -Monitor heart rate -Follow cardiology recommendations -Continue the heparin  infusion-need to be switched to Eliquis  before discharge - Might need TEE followed by DCCV and outpatient EP evaluation

## 2023-09-28 NOTE — Assessment & Plan Note (Addendum)
 Patient no longer on losartan  Labetalol  5 mg IV every 3 hours as needed for SBP >170, 4 doses ordered

## 2023-09-28 NOTE — Progress Notes (Addendum)
 The pt refused blood cultures at this time.   Pt refused blood glucose check at admission to the unit as he reports they had obtained his blood glucose 10 minutes before being transferred to the ICU. Reported blood glucose was 145 and continued to refuse blood glucose check.

## 2023-09-29 ENCOUNTER — Other Ambulatory Visit (HOSPITAL_COMMUNITY): Payer: Self-pay

## 2023-09-29 ENCOUNTER — Inpatient Hospital Stay (HOSPITAL_COMMUNITY)
Admit: 2023-09-29 | Discharge: 2023-09-29 | Disposition: A | Discharge status: Home / Self Care | Payer: PRIVATE HEALTH INSURANCE | Attending: Medical | Admitting: Medical

## 2023-09-29 DIAGNOSIS — I4891 Unspecified atrial fibrillation: Secondary | ICD-10-CM

## 2023-09-29 DIAGNOSIS — I513 Intracardiac thrombosis, not elsewhere classified: Secondary | ICD-10-CM

## 2023-09-29 DIAGNOSIS — N179 Acute kidney failure, unspecified: Secondary | ICD-10-CM

## 2023-09-29 DIAGNOSIS — I480 Paroxysmal atrial fibrillation: Secondary | ICD-10-CM

## 2023-09-29 DIAGNOSIS — E782 Mixed hyperlipidemia: Secondary | ICD-10-CM

## 2023-09-29 DIAGNOSIS — I422 Other hypertrophic cardiomyopathy: Secondary | ICD-10-CM | POA: Diagnosis not present

## 2023-09-29 LAB — BASIC METABOLIC PANEL WITH GFR
Anion gap: 10 (ref 5–15)
BUN: 29 mg/dL — ABNORMAL HIGH (ref 6–20)
CO2: 19 mmol/L — ABNORMAL LOW (ref 22–32)
Calcium: 7.9 mg/dL — ABNORMAL LOW (ref 8.9–10.3)
Chloride: 104 mmol/L (ref 98–111)
Creatinine, Ser: 1.66 mg/dL — ABNORMAL HIGH (ref 0.61–1.24)
GFR, Estimated: 47 mL/min — ABNORMAL LOW (ref >60)
Glucose, Bld: 128 mg/dL — ABNORMAL HIGH (ref 70–99)
Potassium: 4.6 mmol/L (ref 3.5–5.1)
Sodium: 133 mmol/L — ABNORMAL LOW (ref 135–145)

## 2023-09-29 LAB — ECHOCARDIOGRAM COMPLETE
AR max vel: 1.58 cm2
AV Area VTI: 1.92 cm2
AV Area mean vel: 1.77 cm2
AV Mean grad: 1 mmHg
AV Peak grad: 2.7 mmHg
Ao pk vel: 0.82 m/s
Area-P 1/2: 5.84 cm2
Height: 68 in
S' Lateral: 3.8 cm
Weight: 4652.59 [oz_av]

## 2023-09-29 LAB — GLUCOSE, CAPILLARY: Glucose-Capillary: 130 mg/dL — ABNORMAL HIGH (ref 70–99)

## 2023-09-29 LAB — CBC
HCT: 48.1 % (ref 39.0–52.0)
Hemoglobin: 15.3 g/dL (ref 13.0–17.0)
MCH: 29.8 pg (ref 26.0–34.0)
MCHC: 31.8 g/dL (ref 30.0–36.0)
MCV: 93.6 fL (ref 80.0–100.0)
Platelets: 240 K/uL (ref 150–400)
RBC: 5.14 MIL/uL (ref 4.22–5.81)
RDW: 14.9 % (ref 11.5–15.5)
WBC: 11.4 K/uL — ABNORMAL HIGH (ref 4.0–10.5)
nRBC: 0 % (ref 0.0–0.2)

## 2023-09-29 LAB — HEPARIN LEVEL (UNFRACTIONATED): Heparin Unfractionated: 0.54 [IU]/mL (ref 0.30–0.70)

## 2023-09-29 MED ORDER — HYDROXYZINE HCL 10 MG PO TABS
10.0000 mg | ORAL_TABLET | Freq: Three times a day (TID) | ORAL | Status: DC | PRN
  Administered 2023-09-29 (×2): 10 mg via ORAL
  Filled 2023-09-29 (×5): qty 1

## 2023-09-29 MED ORDER — DOXYCYCLINE HYCLATE 100 MG PO TABS
100.0000 mg | ORAL_TABLET | Freq: Two times a day (BID) | ORAL | Status: DC
  Administered 2023-09-29 – 2023-09-30 (×2): 100 mg via ORAL
  Filled 2023-09-29 (×2): qty 1

## 2023-09-29 MED ORDER — ALPRAZOLAM 0.25 MG PO TABS
0.2500 mg | ORAL_TABLET | Freq: Once | ORAL | Status: AC
  Administered 2023-09-29: 0.25 mg via ORAL
  Filled 2023-09-29: qty 1

## 2023-09-29 NOTE — Progress Notes (Signed)
 Progress Note   Patient: Kenneth Ferguson FMW:[id number] DOB: 30-Jun-1965 DOA: 09/28/2023     1 DOS: the patient was seen and examined on 09/29/2023   Brief hospital course: Mr. [name] is a 58 year old male with history of cholecystectomy, atrial fibrillation with history of appendage thrombus in July 2020, hocm, morbid obesity, medication noncompliance, heart failure preserved ejection fraction, hypertrophic cardiomyopathy, history of possible Takotsubo after the sudden loss of his spouse in 2020, who presents emergency department for chief concerns of shortness of breath.  Patient endorsed to EDP that he has not been taking his heart failure and/or atrial fibrillation medication.  This was also noted on prior outpatient cardiology note.  Vitals in the ED showed t 97.5, rr 21, heart rate 178, blood pressure 141/94, SpO2 of 98% on room air.  Serum sodium is 134, potassium 4.1, chloride 104, bicarb 18, BUN of 20, serum creatinine 1.18, EGFR greater than 60, nonfasting blood glucose 104, WBC 9.6, hemoglobin 15.7, platelets of 241. AST is 37, ALT is 47.  BNP is 714.9.  HS troponin is 36. Magnesium  is 2.3, TSH is 2.080, procalcitonin less than 0.1. CXR with persistent patchy density in the right mid to upper chest, no new finding. CTA chest with no PE, multiple patchy airspace opacities in right upper and lower lobes most consistent with multifocal pneumonia, small right pleural effusion and a small pericardial effusion was also noted.  ED treatment: Diltiazem  15 mg bolus one-time dose, diltiazem  gtt. heparin  GTT were initiated.  Ceftriaxone  2 g IV one-time dose, sodium chloride  250 mL one-time bolus, doxycycline  100 mg IV one-time dose.  8/22: Vitals with mildly elevated blood pressure and heart rate with some improvement and now in high 90s.  Troponin mildly positive with a flat curve, preliminary blood cultures negative.  Mild hyponatremia at 133, CO2 of 19, AKI with creatinine at 1.66 with  baseline around 1.  Patient received a dose of IV Lasix  yesterday. Echocardiogram with EF of 25 to 30%, global hypokinesis and indeterminate diastolic parameter.  Also shows moderately reduced right ventricular systolic function, severely dilated left atrium, IVC dilated.  Cardizem  was discontinued due to reduced EF.  Cardiology is on board.  Also has concern of sleep apnea-patient is morbidly obese and need outpatient sleep study for further evaluation.  Assessment and Plan: * Paroxysmal atrial fibrillation with RVR (HCC) Patient with history of recurrent atrial fibrillation and has stopped taking his medications himself. Heart rate initially improved with Cardizem  infusion which was discontinued due to decreased EF on echocardiogram. Cardiology is on board and trying to increase the dose of beta-blocker if that will help. Digoxin  might not be a good choice due to history of hocm.  Cardiology trying to avoid amiodarone  as patient was not taking any anticoagulation at home with history of prior left appendageal thrombus. -Monitor heart rate -Follow cardiology recommendations -Continue the heparin  infusion-need to be switched to Eliquis  before discharge - Might need TEE followed by DCCV and outpatient EP evaluation  Multilobar lung infiltrate Patient has having cough for about 2 weeks. Continue with doxycycline  100 mg IV twice daily, ceftriaxone  2 g IV daily to complete a 5-day course Incentive spirometry, flutter valve  Heart failure with recovered ejection fraction (HFrecEF) (HCC) Complete echo on 08/08/2019 showed estimated ejection fraction of 60 to 65%, grade 2 diastolic dysfunction.  Echocardiogram today with a EF of 20 to 25%, severely dilated heart and dilated IVC. Increase in creatinine after getting 1 dose of IV Lasix  yesterday.  -Cardiology  is planning to slowly add GDMT  Hypertrophic cardiomyopathy (HCC) Recommend continued close cardiology follow-up  Essential  hypertension Patient no longer on losartan  As needed metoprolol   Morbid obesity (HCC) Estimated body mass index is 44.21 kg/m as calculated from the following:   Height as of this encounter: 5' 8 (1.727 m).   Weight as of this encounter: 131.9 kg.   This complicates overall care and prognosis.   Thrombus of left atrial appendage without antecedent myocardial infarction Patient himself stopped taking Eliquis .  Likely will need TEE before cardioversion if that route need to be taken. -Continue with heparin  infusion-they will likely will be restarted on Eliquis  before discharge  AKI (acute kidney injury) (HCC) Creatinine increased to 1.66 after getting 1 dose of IV Lasix  yesterday.  Baseline seems to be around 1 - Monitor renal function   Subjective: Patient was seen and examined today.  Denies any chest pain or shortness of breath.  When asked about why he stopped his medication, patient said that he was feeling fine. Having cough for about 2 to 3 weeks, no fever or chills  Physical Exam: Vitals:   09/29/23 0935 09/29/23 0945 09/29/23 1000 09/29/23 1030  BP: 119/82 128/78  101/85  Pulse:  (!) 127    Resp: 17 17 (!) 23 (!) 23  Temp:      TempSrc:      SpO2: 95% 96%  94%  Weight:      Height:       General. Morbidly obese gentleman, in no acute distress. Pulmonary.  Lungs clear bilaterally, normal respiratory effort. CV.  Irregularly irregular Abdomen.  Soft, nontender, nondistended, BS positive. CNS.  Alert and oriented .  No focal neurologic deficit. Extremities.  No edema, no cyanosis, pulses intact and symmetrical. Psychiatry.  Judgment and insight appears normal.   Data Reviewed: Prior data reviewed  Family Communication: Discussed with patient  Disposition: Status is: Inpatient Remains inpatient appropriate because: Severity of illness.  Planned Discharge Destination: Home  DVT prophylaxis.  Heparin  infusion Time spent: 50 minutes  This record has been  created using Conservation officer, historic buildings. Errors have been sought and corrected,but may not always be located. Such creation errors do not reflect on the standard of care.   Author: Amaryllis Dare, MD 09/29/2023 1:56 PM  For on call review [email or web address].

## 2023-09-29 NOTE — Plan of Care (Signed)
  Problem: Fluid Volume: Goal: Hemodynamic stability will improve Outcome: Progressing   Problem: Clinical Measurements: Goal: Diagnostic test results will improve Outcome: Progressing Goal: Signs and symptoms of infection will decrease Outcome: Progressing   Problem: Education: Goal: Knowledge of General Education information will improve Description: Including pain rating scale, medication(s)/side effects and non-pharmacologic comfort measures Outcome: Progressing   Problem: Clinical Measurements: Goal: Ability to maintain clinical measurements within normal limits will improve Outcome: Progressing Goal: Cardiovascular complication will be avoided Outcome: Progressing   Problem: Activity: Goal: Risk for activity intolerance will decrease Outcome: Progressing   Problem: Nutrition: Goal: Adequate nutrition will be maintained Outcome: Progressing   Problem: Elimination: Goal: Will not experience complications related to bowel motility Outcome: Progressing Goal: Will not experience complications related to urinary retention Outcome: Progressing   Problem: Pain Managment: Goal: General experience of comfort will improve and/or be controlled Outcome: Progressing   Problem: Safety: Goal: Ability to remain free from injury will improve Outcome: Progressing

## 2023-09-29 NOTE — Progress Notes (Signed)
*  PRELIMINARY RESULTS* Echocardiogram 2D Echocardiogram has been performed.  Floydene Harder 09/29/2023, 10:57 AM

## 2023-09-29 NOTE — Progress Notes (Addendum)
 Progress Note  Patient Name: Quayshaun Hubbert Date of Encounter: 09/29/2023 [facility] Cardiologist: Lonni Hanson, MD   Interval Summary   Patient feeling better with less shortness of breath.  No chest pain or palpitations.  Echocardiogram being performed at the time of my evaluation.  Vital Signs Vitals:   09/29/23 0935 09/29/23 0945 09/29/23 1000 09/29/23 1030  BP: 119/82 128/78  101/85  Pulse:  (!) 127    Resp: 17 17 (!) 23 (!) 23  Temp:      TempSrc:      SpO2: 95% 96%  94%  Weight:      Height:        Intake/Output Summary (Last 24 hours) at 09/29/2023 1147 Last data filed at 09/29/2023 1100 Gross per 24 hour  Intake 2051.03 ml  Output 200 ml  Net 1851.03 ml      09/28/2023    6:10 PM 09/28/2023   10:31 AM 07/14/2023   11:28 AM  Last 3 Weights  Weight (lbs) 290 lb 12.6 oz 268 lb 282 lb 6.4 oz  Weight (kg) 131.9 kg 121.564 kg 128.096 kg      Telemetry/ECG  Atrial fibrillation with PVCs versus aberrancy, ventricular rates between 90 and 130 bpm - Personally Reviewed  Physical Exam  GEN: No acute distress.   Neck: Difficult to assess JVP due to body habitus. Cardiac: Irregularly irregular rhythm with 2/6 systolic murmur. Respiratory: Mildly diminished breath sounds throughout without wheezes or crackles. GI: Soft, nontender, non-distended  MS: Trace pretibial edema bilaterally.  Assessment & Plan  Persistent atrial fibrillation: Suspect recurrent atrial fibrillation was triggered by pneumonia, though in the setting of underlying hypertrophic cardiomyopathy, he is certainly at elevated risk for developing atrial fibrillation.  He has been resistant to optimize his therapy, having even self-discontinued apixaban  over the last year despite history of left atrial appendage thrombus in the setting of atrial fibrillation and HFrEF in 2020.  Ventricular rate control has improved. -Continue bisoprolol  and diltiazem  infusion pending echocardiogram.  If LVEF is  reduced again, we will need to transition from diltiazem  and escalate beta-blocker. -If we are unable to achieve adequate rate control, TEE-guided cardioversion will need to be considered prior to discharge once pneumonia/respiratory status has improved. -Continue avoidance of amiodarone , given risk for pharmacologic cardioversion and lack of preadmission anticoagulation. -Favor avoiding digoxin  as well, given its potential for increasing LVOT gradient in the setting of hokum. -Continue heparin  infusion with plans to transition to apixaban  prior to discharge. -Consider outpatient EP consultation if patient is agreeable.  Hypertrophic cardiomyopathy and heart failure with recovered ejection fraction: Mr. Corpus appears mildly volume overloaded, though his body habitus limits evaluation. -Given soft blood pressure, recommend maintaining net even fluid balance. -Continue bisoprolol . -Follow-up TTE.  If LVEF reduced again, further escalation of GDMT will need to be pursued. -Patient reports remote catheterization demonstrating normal coronary arteries (we were never able to obtain these records).  No ischemia evaluation has been performed in our system. - Given bump in creatinine overnight with single dose of furosemide , I will hold off on additional diuresis for now, targeting a net even fluid balance.  Multilobar pneumonia: Symptoms improving with empiric antimicrobial therapy. -Continue ceftriaxone  and doxycycline  per primary team.  Addendum (09/29/23 3:01 PM): Echocardiogram personally reviewed, demonstrating severely reduced LVEF of 25-30 %.  Wall motion difficult to assess, though it appears that the anterior and septal walls are most affected.  At least moderate RV dysfunction also noted.  Small to moderate pericardial  effusion and severely elevated CVP seen.  Given these findings, I have discontinued diltiazem  infusion.  We will continue current dose of bisoprolol  and continue monitoring  heart rate as diltiazem  washes out.  If blood pressure allows, judicious escalation of bisoprolol  may be needed for heart rate control.  If this proves unsuccessful, we will unfortunately need to initiate amiodarone  infusion for rate control though there is a risk for pharmacologic cardioversion and cardioembolic events given that the patient has been noncompliant with his anticoagulation.  I do not think that digoxin  would be a great option with his history of hypertrophic cardiomyopathy (though no obvious LVOT obstruction seen on echo) and acute kidney injury.  Bump in creatinine is likely multifactorial but certainly could be due to diuresis attempted yesterday.  I would shoot for a net even fluid balance now.  If his renal function continues to worsen, he may need a right heart catheterization to better understand his hemodynamics.   For questions or updates, please contact [facility] Please consult [email or web address] for contact info under [facility].     Signed, Lonni Hanson, MD

## 2023-09-29 NOTE — Progress Notes (Signed)
 PHARMACY - ANTICOAGULATION CONSULT NOTE  Pharmacy Consult for Heparin  Infusion  Indication: atrial fibrillation  No Known Allergies  Patient Measurements: Height: 5' 8 (172.7 cm) Weight: 131.9 kg (290 lb 12.6 oz) IBW/kg (Calculated) : 68.4 HEPARIN  DW (KG): 99.4  Vital Signs: BP: 125/82 (08/22 0500) Pulse Rate: 76 (08/22 0600)  Labs: Recent Labs    09/28/23 1047 09/28/23 1114 09/28/23 1417 09/28/23 2203 09/29/23 0539  HGB 15.7  --   --   --  15.3  HCT 48.0  --   --   --  48.1  PLT 241  --   --   --  240  APTT  --  30  --   --   --   LABPROT  --  16.6*  --   --   --   INR  --  1.3*  --   --   --   HEPARINUNFRC  --   --   --  0.44 0.54  CREATININE 1.18  --   --   --  1.66*  TROPONINIHS 36*  --  36*  --   --     Estimated Creatinine Clearance: 63.6 mL/min (A) (by C-G formula based on SCr of 1.66 mg/dL (H)).   Medical History: Past Medical History:  Diagnosis Date   HFrEF (heart failure with reduced ejection fraction) (HCC)    a. 08/2018 Echo: EF 40-45% in setting of Afib; b. 11/2018 Echo: EF 25-30% w/ sev ant, antsept, and apical HK in setting of Afib w/ RVR; b. 08/2019 Echo: EF 60-65%.   HOCM (hypertrophic obstructive cardiomyopathy) (HCC)    a. Prev dx in early 2000s?; b. 08/2019 Echo: EF 60-65%, sev asym septal hypertrophy w/ septal wall of 20mm. SAM. LVOT grad of @ rest, w/ valsalva. Gr2 DD. Nl RV fxn. Mod dil LA. Mild MR.   Morbid obesity (HCC)    Persistent atrial fibrillation (HCC)    a. 11/2018 s/p TEE/DCCV. Maintained on Amio/Eliquis  (CHA2DS2VASc =1).   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 08/2018 TEE: LAA thrombus.    Medications:  No AC Prior to Admission- Per patient, he has not taken Eliquis  for years.   Assessment: 58 yo male admitted with SOB, fatigue and palpitations. Patient has PMH of hypertrophic cardiomyopathy, CHF, and A. Fib (Not taking AC prior to admission). Pharmacy has been consulted  for heparin  infusion dosing for A. Fib.   Goal of Therapy:  Heparin  level 0.3-0.7 units/ml Monitor platelets by anticoagulation protocol: Yes  8/21 @ 2203: HL 0.44  Therapeutic x 1 8/22 0539 HL 0.54, therapeutic x 2   Plan:  Continue heparin  infusion at 1400 units/hr Recheck HL daily w/ AM labs while therapeutic Continue to monitor H&H and platelets  Rankin CANDIE Dills, PharmD, [facility] 09/29/2023 6:24 AM

## 2023-09-29 NOTE — Assessment & Plan Note (Signed)
 Creatinine increased to 1.66 after getting 1 dose of IV Lasix  yesterday.  Baseline seems to be around 1 - Monitor renal function

## 2023-09-29 NOTE — Assessment & Plan Note (Signed)
 Patient himself stopped taking Eliquis .  Likely will need TEE before cardioversion if that route need to be taken. -Continue with heparin  infusion-they will likely will be restarted on Eliquis  before discharge

## 2023-09-30 DIAGNOSIS — I5023 Acute on chronic systolic (congestive) heart failure: Secondary | ICD-10-CM

## 2023-09-30 DIAGNOSIS — J189 Pneumonia, unspecified organism: Secondary | ICD-10-CM | POA: Diagnosis not present

## 2023-09-30 DIAGNOSIS — I4891 Unspecified atrial fibrillation: Secondary | ICD-10-CM | POA: Diagnosis not present

## 2023-09-30 DIAGNOSIS — I480 Paroxysmal atrial fibrillation: Secondary | ICD-10-CM | POA: Diagnosis not present

## 2023-09-30 DIAGNOSIS — I5020 Unspecified systolic (congestive) heart failure: Secondary | ICD-10-CM

## 2023-09-30 DIAGNOSIS — E782 Mixed hyperlipidemia: Secondary | ICD-10-CM | POA: Diagnosis not present

## 2023-09-30 LAB — BASIC METABOLIC PANEL WITH GFR
Anion gap: 7 (ref 5–15)
BUN: 32 mg/dL — ABNORMAL HIGH (ref 6–20)
CO2: 21 mmol/L — ABNORMAL LOW (ref 22–32)
Calcium: 8 mg/dL — ABNORMAL LOW (ref 8.9–10.3)
Chloride: 105 mmol/L (ref 98–111)
Creatinine, Ser: 1.53 mg/dL — ABNORMAL HIGH (ref 0.61–1.24)
GFR, Estimated: 52 mL/min — ABNORMAL LOW (ref >60)
Glucose, Bld: 97 mg/dL (ref 70–99)
Potassium: 3.9 mmol/L (ref 3.5–5.1)
Sodium: 133 mmol/L — ABNORMAL LOW (ref 135–145)

## 2023-09-30 LAB — GLUCOSE, CAPILLARY: Glucose-Capillary: 79 mg/dL (ref 70–99)

## 2023-09-30 LAB — CBC
HCT: 45.3 % (ref 39.0–52.0)
Hemoglobin: 14.7 g/dL (ref 13.0–17.0)
MCH: 29.9 pg (ref 26.0–34.0)
MCHC: 32.5 g/dL (ref 30.0–36.0)
MCV: 92.3 fL (ref 80.0–100.0)
Platelets: 209 K/uL (ref 150–400)
RBC: 4.91 MIL/uL (ref 4.22–5.81)
RDW: 15 % (ref 11.5–15.5)
WBC: 10.4 K/uL (ref 4.0–10.5)
nRBC: 0 % (ref 0.0–0.2)

## 2023-09-30 LAB — HEPARIN LEVEL (UNFRACTIONATED): Heparin Unfractionated: 0.42 [IU]/mL (ref 0.30–0.70)

## 2023-09-30 MED ORDER — BISOPROLOL FUMARATE 5 MG PO TABS: 5.0000 mg | ORAL_TABLET | Freq: Two times a day (BID) | ORAL | 0 refills | Status: DC

## 2023-09-30 MED ORDER — APIXABAN 5 MG PO TABS
5.0000 mg | ORAL_TABLET | Freq: Two times a day (BID) | ORAL | Status: DC
  Administered 2023-09-30: 5 mg via ORAL
  Filled 2023-09-30: qty 1

## 2023-09-30 MED ORDER — APIXABAN 5 MG PO TABS: 5.0000 mg | ORAL_TABLET | Freq: Two times a day (BID) | ORAL | 1 refills | Status: DC

## 2023-09-30 NOTE — Progress Notes (Signed)
 After 0800 CBG check, pt states this will be the last one and refuses further CBG checks. MD notified.

## 2023-09-30 NOTE — Progress Notes (Signed)
 Progress Note   Patient: Kenneth Ferguson FMW:[id number] DOB: 10-May-1965 DOA: 09/28/2023     2 DOS: the patient was seen and examined on 09/30/2023   Brief hospital course: [name] is a 58 year old male with history of cholecystectomy, atrial fibrillation with history of appendage thrombus in July 2020, hocm, morbid obesity, medication noncompliance, heart failure preserved ejection fraction, hypertrophic cardiomyopathy, history of possible Takotsubo after the sudden loss of his spouse in 2020, who presents emergency department for chief concerns of shortness of breath.  Patient endorsed to EDP that he has not been taking his heart failure and/or atrial fibrillation medication.  This was also noted on prior outpatient cardiology note.  Vitals in the ED showed t 97.5, rr 21, heart rate 178, blood pressure 141/94, SpO2 of 98% on room air.  Serum sodium is 134, potassium 4.1, chloride 104, bicarb 18, BUN of 20, serum creatinine 1.18, EGFR greater than 60, nonfasting blood glucose 104, WBC 9.6, hemoglobin 15.7, platelets of 241. AST is 37, ALT is 47.  BNP is 714.9.  HS troponin is 36. Magnesium  is 2.3, TSH is 2.080, procalcitonin less than 0.1. CXR with persistent patchy density in the right mid to upper chest, no new finding. CTA chest with no PE, multiple patchy airspace opacities in right upper and lower lobes most consistent with multifocal pneumonia, small right pleural effusion and a small pericardial effusion was also noted.  ED treatment: Diltiazem  15 mg bolus one-time dose, diltiazem  gtt. heparin  GTT were initiated.  Ceftriaxone  2 g IV one-time dose, sodium chloride  250 mL one-time bolus, doxycycline  100 mg IV one-time dose.  8/22: Vitals with mildly elevated blood pressure and heart rate with some improvement and now in high 90s.  Troponin mildly positive with a flat curve, preliminary blood cultures negative.  Mild hyponatremia at 133, CO2 of 19, AKI with creatinine at 1.66 with  baseline around 1.  Patient received a dose of IV Lasix  yesterday. Echocardiogram with EF of 25 to 30%, global hypokinesis and indeterminate diastolic parameter.  Also shows moderately reduced right ventricular systolic function, severely dilated left atrium, IVC dilated.  Cardizem  was discontinued due to reduced EF.  Cardiology is on board.  Also has concern of sleep apnea-patient is morbidly obese and need outpatient sleep study for further evaluation.  8/23: Hemodynamically stable but heart rate remained elevated, very limited choices for his A-fib with RVR.  Likely will need TEE followed by DCCV.  Improving creatinine now at 1.53. Patient was also threatening to leave AMA, resistant to medical advice.  Need to at least take Eliquis  per cardiology.  Assessment and Plan: * Paroxysmal atrial fibrillation with RVR (HCC) Patient with history of recurrent atrial fibrillation and has stopped taking his medications himself. Heart rate initially improved with Cardizem  infusion which was discontinued due to decreased EF on echocardiogram. Cardiology is on board and trying to increase the dose of beta-blocker if that will help. Digoxin  might not be a good choice due to history of hocm.  Cardiology trying to avoid amiodarone  as patient was not taking any anticoagulation at home with history of prior left appendageal thrombus. -Monitor heart rate-remained elevated -Follow cardiology recommendations -Heparin  was switched with Eliquis  - Might need TEE followed by DCCV and outpatient EP evaluation -Threatening to leave AMA  Multilobar lung infiltrate Patient has having cough for about 2 weeks. Continue with doxycycline  100 mg IV twice daily, ceftriaxone  2 g IV daily to complete a 5-day course Incentive spirometry, flutter valve  Heart failure with  recovered ejection fraction (HFrecEF) (HCC) Complete echo on 08/08/2019 showed estimated ejection fraction of 60 to 65%, grade 2 diastolic  dysfunction.  Echocardiogram today with a EF of 20 to 25%, severely dilated heart and dilated IVC. Increase in creatinine after getting 1 dose of IV Lasix  yesterday.  -Cardiology is planning to slowly add GDMT  Hypertrophic cardiomyopathy (HCC) Recommend continued close cardiology follow-up  Essential hypertension Patient no longer on losartan  As needed metoprolol   Morbid obesity (HCC) Estimated body mass index is 44.21 kg/m as calculated from the following:   Height as of this encounter: 5' 8 (1.727 m).   Weight as of this encounter: 131.9 kg.   This complicates overall care and prognosis.   Thrombus of left atrial appendage without antecedent myocardial infarction Patient himself stopped taking Eliquis .  Likely will need TEE before cardioversion if that route need to be taken. -Continue with heparin  infusion-they will likely will be restarted on Eliquis  before discharge  AKI (acute kidney injury) (HCC) AKI after getting 1 dose of IV Lasix , also concern of cardiorenal with new diagnosis of HFrEF.  Some improvement in creatinine to 1.53 - Monitor renal function   Subjective: Patient was seen and examined today.  Denies any chest pain or shortness of breath.  Very irritable and heart rate remains elevated.  He has some plan in his mind and was not willing to take any medical advice at this time.  Physical Exam: Vitals:   09/30/23 0600 09/30/23 0800 09/30/23 0900 09/30/23 1000  BP: 97/66 92/72 (!) 77/64 91/75  Pulse: 60 (!) 102 (!) 49 (!) 58  Resp: 18 (!) 25 18 16   Temp:  97.8 F (36.6 C)    TempSrc:  Axillary    SpO2: 95% 94% 95% 97%  Weight:      Height:       General.  Morbidly obese gentleman, in no acute distress. Pulmonary.  Lungs clear bilaterally, normal respiratory effort. CV.  Irregularly irregular Abdomen.  Soft, nontender, nondistended, BS positive. CNS.  Alert and oriented .  No focal neurologic deficit. Extremities.  1+ LE edema, no cyanosis, pulses  intact and symmetrical. Psychiatry.  Judgment and insight appears normal.   Data Reviewed: Prior data reviewed  Family Communication: Discussed with patient  Disposition: Status is: Inpatient Remains inpatient appropriate because: Severity of illness.  Planned Discharge Destination: Home  DVT prophylaxis.  Lovenox  Time spent: 50 minutes  This record has been created using Conservation officer, historic buildings. Errors have been sought and corrected,but may not always be located. Such creation errors do not reflect on the standard of care.   Author: Amaryllis Dare, MD 09/30/2023 1:19 PM  For on call review [email or web address].

## 2023-09-30 NOTE — Progress Notes (Signed)
 Rounding Note   Patient Name: Kenneth Ferguson Date of Encounter: 09/30/2023  [facility] Cardiologist: Lonni Hanson, MD   Subjective Feeling 85% better.  Wants to go home.  Denies lightheadedness walking.  Denies chest pain or orthopnea.    Scheduled Meds:  bisoprolol   5 mg Oral BID   Chlorhexidine  Gluconate Cloth  6 each Topical Daily   doxycycline   100 mg Oral Q12H   lidocaine   1-2 patch Transdermal Q24H   Continuous Infusions:  cefTRIAXone  (ROCEPHIN )  IV 2 g (09/30/23 1001)   heparin  1,400 Units/hr (09/30/23 0500)   PRN Meds: acetaminophen  **OR** acetaminophen , albuterol , guaiFENesin -dextromethorphan , hydrOXYzine , metoprolol  tartrate, ondansetron  **OR** ondansetron  (ZOFRAN ) IV   Vital Signs  Vitals:   09/30/23 0600 09/30/23 0800 09/30/23 0900 09/30/23 1000  BP: 97/66 92/72 (!) 77/64 91/75  Pulse: 60 (!) 102 (!) 49 (!) 58  Resp: 18 (!) 25 18 16   Temp:  97.8 F (36.6 C)    TempSrc:  Axillary    SpO2: 95% 94% 95% 97%  Weight:      Height:        Intake/Output Summary (Last 24 hours) at 09/30/2023 1005 Last data filed at 09/30/2023 0500 Gross per 24 hour  Intake 741.68 ml  Output 200 ml  Net 541.68 ml      09/28/2023    6:10 PM 09/28/2023   10:31 AM 07/14/2023   11:28 AM  Last 3 Weights  Weight (lbs) 290 lb 12.6 oz 268 lb 282 lb 6.4 oz  Weight (kg) 131.9 kg 121.564 kg 128.096 kg      Telemetry Atrial fibirllation.  Rate 70s-150s - Personally Reviewed  ECG  09/28/23: Atrial fibrillation.  Rate 178 bpm.  Low voltage.   - Personally Reviewed  Physical Exam  VS:  BP 91/75   Pulse (!) 58   Temp 97.8 F (36.6 C) (Axillary)   Resp 16   Ht 5' 8 (1.727 m)   Wt 131.9 kg   SpO2 97%   BMI 44.21 kg/m  , BMI Body mass index is 44.21 kg/m. GENERAL:  Agitated.  No acute distress.  HEENT: Pupils equal round and reactive, fundi not visualized, oral mucosa unremarkable NECK:  No jugular venous distention, waveform within normal limits, carotid upstroke  brisk and symmetric, no bruits, no thyromegaly LUNGS:  Diminished breath sounds  HEART:  Tachycardic.  Irregularly irregular.  PMI not displaced or sustained,S1 and S2 within normal limits, no S3, no S4, no clicks, no rubs, no murmurs ABD:  Flat, positive bowel sounds normal in frequency in pitch, no bruits, no rebound, no guarding, no midline pulsatile mass, no hepatomegaly, no splenomegaly EXT:  2 plus pulses throughout, trace bilateral LE edema, no cyanosis no clubbing SKIN:  No rashes no nodules NEURO:  Cranial nerves II through XII grossly intact, motor grossly intact throughout PSYCH:  Cognitively intact, oriented to person place and time   Labs High Sensitivity Troponin:   Recent Labs  Lab 09/28/23 1047 09/28/23 1417  TROPONINIHS 36* 36*     Chemistry Recent Labs  Lab 09/28/23 1047 09/29/23 0539 09/30/23 0524  NA 134* 133* 133*  K 4.1 4.6 3.9  CL 104 104 105  CO2 18* 19* 21*  GLUCOSE 104* 128* 97  BUN 20 29* 32*  CREATININE 1.18 1.66* 1.53*  CALCIUM 8.6* 7.9* 8.0*  MG 2.3  --   --   PROT 6.9  --   --   ALBUMIN 3.5  --   --   AST  37  --   --   ALT 47*  --   --   ALKPHOS 76  --   --   BILITOT 1.9*  --   --   GFRNONAA >60 47* 52*  ANIONGAP 12 10 7     Lipids No results for input(s): CHOL, TRIG, HDL, LABVLDL, LDLCALC, CHOLHDL in the last 168 hours.  Hematology Recent Labs  Lab 09/28/23 1047 09/29/23 0539 09/30/23 0524  WBC 9.6 11.4* 10.4  RBC 5.28 5.14 4.91  HGB 15.7 15.3 14.7  HCT 48.0 48.1 45.3  MCV 90.9 93.6 92.3  MCH 29.7 29.8 29.9  MCHC 32.7 31.8 32.5  RDW 15.0 14.9 15.0  PLT 241 240 209   Thyroid   Recent Labs  Lab 09/28/23 1047  TSH 2.080  FREET4 1.47*    BNP Recent Labs  Lab 09/28/23 1047  BNP 714.9*    DDimer No results for input(s): DDIMER in the last 168 hours.   Radiology  ECHOCARDIOGRAM COMPLETE Result Date: 09/29/2023    ECHOCARDIOGRAM REPORT   Patient Name:   Kenneth Ferguson Date of Exam: 09/29/2023 Medical Rec #:   [id number]     Height:       68.0 in Accession #:    [id number]    Weight:       290.8 lb Date of Birth:  1965/05/09      BSA:          2.396 m Patient Age:    58 years      BP:           98/79 mmHg Patient Gender: M             HR:           91 bpm. Exam Location:  [facility] Procedure: 2D Echo, Cardiac Doppler and Color Doppler (Both Spectral and Color            Flow Doppler were utilized during procedure). Indications:     Atrial fibrillation I48.91  History:         Patient has prior history of Echocardiogram examinations, most                  recent 08/08/2019. Heart failure with reduced ejection fraction,                  persistent atrial fibrillation, hypertrophic obstructive                  cardiomyopathy.  Sonographer:     Christopher Furnace Referring Phys:  [id number] CADENCE H FURTH Diagnosing Phys: Redell Cave MD  Sonographer Comments: Suboptimal apical window and Technically challenging study due to limited acoustic windows. IMPRESSIONS  1. Left ventricular ejection fraction, by estimation, is 25 to 30%. The left ventricle has severely decreased function. The left ventricle demonstrates global hypokinesis. There is moderate left ventricular hypertrophy. Left ventricular diastolic parameters are indeterminate.  2. Right ventricular systolic function is moderately reduced. The right ventricular size is normal.  3. Left atrial size was severely dilated.  4. Moderate pericardial effusion.  5. The mitral valve is normal in structure. Trivial mitral valve regurgitation.  6. The aortic valve is tricuspid. Aortic valve regurgitation is not visualized.  7. The inferior vena cava is dilated in size with <50% respiratory variability, suggesting right atrial pressure of 15 mmHg. FINDINGS  Left Ventricle: Left ventricular ejection fraction, by estimation, is 25 to 30%. The left ventricle has severely decreased function. The left ventricle demonstrates global hypokinesis.  The left ventricular internal cavity size was normal in  size. There is moderate left ventricular hypertrophy. Left ventricular diastolic parameters are indeterminate. Right Ventricle: The right ventricular size is normal. No increase in right ventricular wall thickness. Right ventricular systolic function is moderately reduced. Left Atrium: Left atrial size was severely dilated. Right Atrium: Right atrial size was normal in size. Pericardium: A moderately sized pericardial effusion is present. Mitral Valve: The mitral valve is normal in structure. Trivial mitral valve regurgitation. Tricuspid Valve: The tricuspid valve is normal in structure. Tricuspid valve regurgitation is not demonstrated. Aortic Valve: The aortic valve is tricuspid. Aortic valve regurgitation is not visualized. Aortic valve mean gradient measures 1.0 mmHg. Aortic valve peak gradient measures 2.7 mmHg. Aortic valve area, by VTI measures 1.92 cm. Pulmonic Valve: The pulmonic valve was not well visualized. Pulmonic valve regurgitation is not visualized. Aorta: The aortic root is normal in size and structure. Venous: The inferior vena cava is dilated in size with less than 50% respiratory variability, suggesting right atrial pressure of 15 mmHg. IAS/Shunts: No atrial level shunt detected by color flow Doppler.  LEFT VENTRICLE PLAX 2D LVIDd:         5.10 cm LVIDs:         3.80 cm LV PW:         1.60 cm LV IVS:        1.10 cm LVOT diam:     2.00 cm LV SV:         20 LV SV Index:   8 LVOT Area:     3.14 cm  RIGHT VENTRICLE RV Basal diam:  3.90 cm RV Mid diam:    3.20 cm LEFT ATRIUM              Index        RIGHT ATRIUM           Index LA diam:        4.10 cm  1.71 cm/m   RA Area:     30.60 cm LA Vol (A2C):   182.0 ml 75.96 ml/m  RA Volume:   106.00 ml 44.24 ml/m LA Vol (A4C):   140.0 ml 58.43 ml/m LA Biplane Vol: 165.0 ml 68.86 ml/m  AORTIC VALVE AV Area (Vmax):    1.58 cm AV Area (Vmean):   1.77 cm AV Area (VTI):     1.92 cm AV Vmax:           82.25 cm/s AV Vmean:          53.700 cm/s AV VTI:             0.103 m AV Peak Grad:      2.7 mmHg AV Mean Grad:      1.0 mmHg LVOT Vmax:         41.40 cm/s LVOT Vmean:        30.300 cm/s LVOT VTI:          0.063 m LVOT/AV VTI ratio: 0.61  AORTA Ao Root diam: 3.20 cm MITRAL VALVE                TRICUSPID VALVE MV Area (PHT): 5.84 cm     TR Peak grad:   16.2 mmHg MV Decel Time: 130 msec     TR Vmax:        201.00 cm/s MV E velocity: 125.00 cm/s  SHUNTS                             Systemic VTI:  0.06 m                             Systemic Diam: 2.00 cm Redell Cave MD Electronically signed by Redell Cave MD Signature Date/Time: 09/29/2023/1:04:12 PM    Final    CT Angio Chest PE W and/or Wo Contrast Result Date: 09/28/2023 CLINICAL DATA:  Shortness of breath. EXAM: CT ANGIOGRAPHY CHEST WITH CONTRAST TECHNIQUE: Multidetector CT imaging of the chest was performed using the standard protocol during bolus administration of intravenous contrast. Multiplanar CT image reconstructions and MIPs were obtained to evaluate the vascular anatomy. RADIATION DOSE REDUCTION: This exam was performed according to the departmental dose-optimization program which includes automated exposure control, adjustment of the mA and/or kV according to patient size and/or use of iterative reconstruction technique. CONTRAST:  75mL OMNIPAQUE  IOHEXOL  350 MG/ML SOLN COMPARISON:  Radiograph same day. FINDINGS: Cardiovascular: Satisfactory opacification of the pulmonary arteries to the segmental level. No evidence of pulmonary embolism. Small pericardial effusion is noted. Mild cardiomegaly is noted. Mediastinum/Nodes: No enlarged mediastinal, hilar, or axillary lymph nodes. Thyroid  gland, trachea, and esophagus demonstrate no significant findings. Lungs/Pleura: Small right pleural effusion is noted. No pneumothorax is noted. Multiple patchy airspace opacities are noted in the right upper and lower lobes most consistent with multifocal pneumonia. Left lung is clear.  Upper Abdomen: No acute abnormality. Musculoskeletal: No chest wall abnormality. No acute or significant osseous findings. Review of the MIP images confirms the above findings. IMPRESSION: 1. No definite evidence of pulmonary embolus. 2. Multiple patchy airspace opacities are noted in the right upper and lower lobes most consistent with multifocal pneumonia. 3. Small right pleural effusion is noted. 4. Small pericardial effusion is noted. Electronically Signed   By: Lynwood Landy Raddle M.D.   On: 09/28/2023 12:08   DG Chest Port 1 View Result Date: 09/28/2023 CLINICAL DATA:  Shortness of breath.  Recent diagnosis of pneumonia. EXAM: PORTABLE CHEST 1 VIEW COMPARISON:  09/24/2023 FINDINGS: Artifact overlies the chest. Cardiomegaly as seen previously. The left chest remains clear. Patchy/hazy density in the right mid to upper chest appears similar allowing for technical differences. There may be slight radiographic improvement. No worsening or new finding. IMPRESSION: Persistent patchy/hazy density in the right mid to upper chest. There may be slight radiographic improvement. No worsening or new finding. Electronically Signed   By: Oneil Officer M.D.   On: 09/28/2023 11:24    Cardiac Studies  Echo 09/29/23: IMPRESSIONS    1. Left ventricular ejection fraction, by estimation, is 25 to 30%. The  left ventricle has severely decreased function. The left ventricle  demonstrates global hypokinesis. There is moderate left ventricular  hypertrophy. Left ventricular diastolic  parameters are indeterminate.   2. Right ventricular systolic function is moderately reduced. The right  ventricular size is normal.   3. Left atrial size was severely dilated.   4. Moderate pericardial effusion.   5. The mitral valve is normal in structure. Trivial mitral valve  regurgitation.   6. The aortic valve is tricuspid. Aortic valve regurgitation is not  visualized.   7. The inferior vena cava is dilated in size with <50%  respiratory  variability, suggesting right atrial pressure of 15 mmHg.   Patient Profile   58 y.o. male with history  of Takotsubo cardiomyopathy with recovered LVEF, hypertrophic cardiomyopathy, persistent atrial fibrillation, left atrial appendage thrombus, self discontinuation of Eliquis , hyperlipidemia and morbid obesity admitted with CAP, recurrent atrial fibrillation with RVR and HFrEF.   Assessment & Plan   # HFrEF:  LVEF this admission 25-30%.  He previously had reduced systolic function in the setting of Takotsbuo cardiomyopathy and systolic function recovered.  Non-ischemic cardiomyopathy.  Patient has self discontinued therapies over time, as he reports that given that his heart failure occurred in the setting of his wife traumatically passing away, he has been able to work on his grief and didn't want to be on long-term heart failure therapy.  This morning he is hypotensive and reports that his baseline BP is always low and that our cuffs are not accurate.  He has no orthostatic symptoms. BP at his visit 05/2022 was 138/82.  He is adamant that he can recover better at home than here.  We had a long discussion about his multiple abnormal vital signs including hypotension, tachycardia, and AKI.  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  Therefore, he understands that under no circumstances will I agree that the best thing for him is to go home.  He chooses to leave the hospital AGAINST MEDICAL ADVICE.  We will attempt to make the best of the situation.  -Continue bisoprolol .  He reports that metoprolol  does not work for him and carvedilol  is not ideal for his current vital signs.  - Titrate GDMT as an outpatient as BP and renal function allow.   # Atrial fibrillation with RVR:  # H/o LA appendage thrombus:  Patient  self-discontinued apixaban  in the past.  Recommend transitioning from heparin  back to this now and he is in agreement.  Stressed the importance of conitnuing this medication.  Offered for him to stay until Monday at which time we could do a TEE, clear his appendage and start amiodarone .  He declined.  Plan for DCCV after 3 weeks of anticoagulation and after his pneumonia clears.  Continue bisoprolol  as above.  Not a candidate for diltiazem  given his HFrEF.  Not a candidate for digoxin  give HOCM.  Outpatient assessment for OSA.  # CAP:  Management per primary team.      For questions or updates, please contact [facility] Please consult [email or web address] for contact info under     Signed, Annabella Scarce, MD  09/30/2023, 10:05 AM

## 2023-09-30 NOTE — Assessment & Plan Note (Signed)
 Patient no longer on losartan  As needed metoprolol 

## 2023-09-30 NOTE — Assessment & Plan Note (Signed)
 Complete echo on 08/08/2019 showed estimated ejection fraction of 60 to 65%, grade 2 diastolic dysfunction.  Echocardiogram today with a EF of 20 to 25%, severely dilated heart and dilated IVC. Increase in creatinine after getting 1 dose of IV Lasix  yesterday.  -Cardiology is planning to slowly add GDMT

## 2023-09-30 NOTE — Progress Notes (Addendum)
 Patient states he wants to leave AMA after conversations about care with Dr. Caleen and Dr. Raford. AMA form signed and placed in chart. Per request of Dr. Caleen, eliquis  administered to patient prior to leaving. Per order, continuing heparin  gtt one hour after administration of eliquis . Patient states he is okay with this because he is waiting on his ride. Patient AxOx4.

## 2023-09-30 NOTE — Progress Notes (Signed)
 PHARMACY - ANTICOAGULATION CONSULT NOTE  Pharmacy Consult for Heparin  Infusion  Indication: atrial fibrillation  No Known Allergies  Patient Measurements: Height: 5' 8 (172.7 cm) Weight: 131.9 kg (290 lb 12.6 oz) IBW/kg (Calculated) : 68.4 HEPARIN  DW (KG): 99.4  Vital Signs: Temp: 98.6 F (37 C) (08/23 0400) BP: 96/73 (08/23 0500) Pulse Rate: 48 (08/23 0500)  Labs: Recent Labs    09/28/23 1047 09/28/23 1114 09/28/23 1417 09/28/23 2203 09/29/23 0539 09/30/23 0524  HGB 15.7  --   --   --  15.3 14.7  HCT 48.0  --   --   --  48.1 45.3  PLT 241  --   --   --  240 209  APTT  --  30  --   --   --   --   LABPROT  --  16.6*  --   --   --   --   INR  --  1.3*  --   --   --   --   HEPARINUNFRC  --   --   --  0.44 0.54 0.42  CREATININE 1.18  --   --   --  1.66*  --   TROPONINIHS 36*  --  36*  --   --   --     Estimated Creatinine Clearance: 63.6 mL/min (A) (by C-G formula based on SCr of 1.66 mg/dL (H)).   Medical History: Past Medical History:  Diagnosis Date   HFrEF (heart failure with reduced ejection fraction) (HCC)    a. 08/2018 Echo: EF 40-45% in setting of Afib; b. 11/2018 Echo: EF 25-30% w/ sev ant, antsept, and apical HK in setting of Afib w/ RVR; b. 08/2019 Echo: EF 60-65%.   HOCM (hypertrophic obstructive cardiomyopathy) (HCC)    a. Prev dx in early 2000s?; b. 08/2019 Echo: EF 60-65%, sev asym septal hypertrophy w/ septal wall of 20mm. SAM. LVOT grad of @ rest, w/ valsalva. Gr2 DD. Nl RV fxn. Mod dil LA. Mild MR.   Morbid obesity (HCC)    Persistent atrial fibrillation (HCC)    a. 11/2018 s/p TEE/DCCV. Maintained on Amio/Eliquis  (CHA2DS2VASc =1).   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 08/2018 TEE: LAA thrombus.    Medications:  No AC Prior to Admission- Per patient, he has not taken Eliquis  for years.   Assessment: 58 yo male admitted with SOB, fatigue and palpitations. Patient has PMH of hypertrophic  cardiomyopathy, CHF, and A. Fib (Not taking AC prior to admission). Pharmacy has been consulted for heparin  infusion dosing for A. Fib.   Goal of Therapy:  Heparin  level 0.3-0.7 units/ml Monitor platelets by anticoagulation protocol: Yes  8/21 @ 2203: HL 0.44  Therapeutic x 1 8/22 0539 HL 0.54, therapeutic x 2 8/23 0524 HL 0.42, therapeutic x 3   Plan:  Continue heparin  infusion at 1400 units/hr Recheck HL daily w/ AM labs while therapeutic Continue to monitor H&H and platelets  Allean Haas PharmD Clinical Pharmacist 09/30/2023

## 2023-09-30 NOTE — Assessment & Plan Note (Signed)
 Patient has having cough for about 2 weeks. Continue with doxycycline  100 mg IV twice daily, ceftriaxone  2 g IV daily to complete a 5-day course Incentive spirometry, flutter valve

## 2023-09-30 NOTE — Assessment & Plan Note (Signed)
 Patient with history of recurrent atrial fibrillation and has stopped taking his medications himself. Heart rate initially improved with Cardizem  infusion which was discontinued due to decreased EF on echocardiogram. Cardiology is on board and trying to increase the dose of beta-blocker if that will help. Digoxin  might not be a good choice due to history of hocm.  Cardiology trying to avoid amiodarone  as patient was not taking any anticoagulation at home with history of prior left appendageal thrombus. -Monitor heart rate-remained elevated -Follow cardiology recommendations -Heparin  was switched with Eliquis  - Might need TEE followed by DCCV and outpatient EP evaluation -Threatening to leave AMA

## 2023-09-30 NOTE — Assessment & Plan Note (Signed)
 AKI after getting 1 dose of IV Lasix , also concern of cardiorenal with new diagnosis of HFrEF.  Some improvement in creatinine to 1.53 - Monitor renal function

## 2023-09-30 NOTE — Plan of Care (Signed)
  Problem: Fluid Volume: Goal: Hemodynamic stability will improve Outcome: Progressing   Problem: Respiratory: Goal: Ability to maintain adequate ventilation will improve Outcome: Progressing   Problem: Clinical Measurements: Goal: Ability to maintain clinical measurements within normal limits will improve Outcome: Progressing Goal: Cardiovascular complication will be avoided Outcome: Progressing   Problem: Nutrition: Goal: Adequate nutrition will be maintained Outcome: Progressing

## 2023-10-02 ENCOUNTER — Telehealth: Payer: Self-pay | Admitting: Internal Medicine

## 2023-10-02 ENCOUNTER — Telehealth: Payer: Self-pay | Admitting: Cardiovascular Disease

## 2023-10-02 NOTE — Telephone Encounter (Signed)
 Called patient and reviewed that he is not cleared to return to work. He is agreeable to see provider for this next appointment but is concerned because he started a new job. Advised that Dr. Mady would do the letter tomorrow and then he will know his recommendations. He verbalized understanding with no further questions.

## 2023-10-02 NOTE — Telephone Encounter (Signed)
 Spoke with patient and he requests letter for being in the ED and able to return to work on Wednesday. Advised that they usually provide that letter but that I will check to see if I can get one for him.   He needs assistance with his Eliquis  however it appears that he is commercial pay so advised that there is a $10 copay card available on Eliquis  website. Confirmed upcoming appointment with Dr. Mady.   He verbalized understanding of our conversation with no further questions at this time.

## 2023-10-02 NOTE — Telephone Encounter (Signed)
 The patient left against medical advice and is NOT cleared to return to work.  I can draft a letter stating that he was admitted when I am in the office tomorrow.  Please confirm that he wishes to keep his appointment with me, as there has also been a request for him to switch providers to Dr. Gollan.  Lonni Hanson, MD [facility]

## 2023-10-02 NOTE — Telephone Encounter (Signed)
 Pt requesting a provider switch from Dr. Mady to Dr. Gollan.

## 2023-10-02 NOTE — Telephone Encounter (Signed)
 Pt requesting assistance for Eliquis  and would also like a work note sent to his Mychart.

## 2023-10-03 ENCOUNTER — Emergency Department
Admission: EM | Admit: 2023-10-03 | Discharge: 2023-10-03 | Disposition: A | Discharge status: Home / Self Care | Payer: PRIVATE HEALTH INSURANCE | Attending: Emergency Medicine | Admitting: Emergency Medicine

## 2023-10-03 ENCOUNTER — Encounter: Payer: Self-pay | Admitting: Internal Medicine

## 2023-10-03 ENCOUNTER — Emergency Department: Payer: PRIVATE HEALTH INSURANCE

## 2023-10-03 ENCOUNTER — Telehealth: Payer: Self-pay | Admitting: Internal Medicine

## 2023-10-03 ENCOUNTER — Other Ambulatory Visit: Payer: Self-pay

## 2023-10-03 DIAGNOSIS — I4891 Unspecified atrial fibrillation: Secondary | ICD-10-CM | POA: Diagnosis not present

## 2023-10-03 DIAGNOSIS — Z5329 Procedure and treatment not carried out because of patient's decision for other reasons: Secondary | ICD-10-CM | POA: Diagnosis not present

## 2023-10-03 DIAGNOSIS — I5021 Acute systolic (congestive) heart failure: Secondary | ICD-10-CM | POA: Insufficient documentation

## 2023-10-03 DIAGNOSIS — R0602 Shortness of breath: Secondary | ICD-10-CM | POA: Diagnosis present

## 2023-10-03 LAB — CBC
HCT: 46.4 % (ref 39.0–52.0)
Hemoglobin: 15 g/dL (ref 13.0–17.0)
MCH: 30.2 pg (ref 26.0–34.0)
MCHC: 32.3 g/dL (ref 30.0–36.0)
MCV: 93.4 fL (ref 80.0–100.0)
Platelets: 181 K/uL (ref 150–400)
RBC: 4.97 MIL/uL (ref 4.22–5.81)
RDW: 15.7 % — ABNORMAL HIGH (ref 11.5–15.5)
WBC: 7.6 K/uL (ref 4.0–10.5)
nRBC: 0 % (ref 0.0–0.2)

## 2023-10-03 LAB — COMPREHENSIVE METABOLIC PANEL WITH GFR
ALT: 136 U/L — ABNORMAL HIGH (ref 0–44)
AST: 54 U/L — ABNORMAL HIGH (ref 15–41)
Albumin: 3.4 g/dL — ABNORMAL LOW (ref 3.5–5.0)
Alkaline Phosphatase: 87 U/L (ref 38–126)
Anion gap: 7 (ref 5–15)
BUN: 20 mg/dL (ref 6–20)
CO2: 22 mmol/L (ref 22–32)
Calcium: 9 mg/dL (ref 8.9–10.3)
Chloride: 107 mmol/L (ref 98–111)
Creatinine, Ser: 1.09 mg/dL (ref 0.61–1.24)
GFR, Estimated: >60 mL/min (ref >60)
Glucose, Bld: 128 mg/dL — ABNORMAL HIGH (ref 70–99)
Potassium: 3.9 mmol/L (ref 3.5–5.1)
Sodium: 136 mmol/L (ref 135–145)
Total Bilirubin: 1 mg/dL (ref 0.0–1.2)
Total Protein: 6.8 g/dL (ref 6.5–8.1)

## 2023-10-03 LAB — CULTURE, BLOOD (ROUTINE X 2)
Culture: NO GROWTH
Culture: NO GROWTH
Special Requests: ADEQUATE

## 2023-10-03 MED ORDER — FUROSEMIDE 10 MG/ML IJ SOLN
40.0000 mg | Freq: Once | INTRAMUSCULAR | Status: AC
  Administered 2023-10-03: 40 mg via INTRAVENOUS
  Filled 2023-10-03: qty 4

## 2023-10-03 MED ORDER — DILTIAZEM HCL 25 MG/5ML IV SOLN
20.0000 mg | Freq: Once | INTRAVENOUS | Status: DC
  Filled 2023-10-03: qty 5

## 2023-10-03 MED ORDER — DILTIAZEM HCL 25 MG/5ML IV SOLN
10.0000 mg | Freq: Once | INTRAVENOUS | Status: AC
  Administered 2023-10-03: 10 mg via INTRAVENOUS

## 2023-10-03 MED ORDER — TORSEMIDE 20 MG PO TABS
20.0000 mg | ORAL_TABLET | Freq: Every day | ORAL | 0 refills | Status: DC
  Filled 2023-10-03: qty 30, 30d supply, fill #0

## 2023-10-03 MED ORDER — DILTIAZEM HCL 25 MG/5ML IV SOLN
20.0000 mg | Freq: Once | INTRAVENOUS | Status: AC
  Administered 2023-10-03: 20 mg via INTRAVENOUS
  Filled 2023-10-03: qty 5

## 2023-10-03 MED ORDER — APIXABAN 5 MG PO TABS
5.0000 mg | ORAL_TABLET | Freq: Two times a day (BID) | ORAL | 1 refills | Status: AC
  Filled 2023-10-03: qty 60, 30d supply, fill #0

## 2023-10-03 NOTE — ED Notes (Signed)
 Pt denies any CP, denies any SOB.

## 2023-10-03 NOTE — ED Notes (Signed)
 Pt had 4 runs of v tach on the monitor. Dr. Viviann aware

## 2023-10-03 NOTE — Telephone Encounter (Signed)
 Pt son called in to inform they are on the way to the ED.

## 2023-10-03 NOTE — Telephone Encounter (Signed)
 I recommend that he return to the ED, since he left AMA from the ICU over the weekend in the setting of acute HFrEF and a-fib with RVR.  Lonni Hanson, MD [facility]

## 2023-10-03 NOTE — Telephone Encounter (Signed)
 Reviewed Dr. Ulysses recommendations and he verbalized understanding with no further questions.

## 2023-10-03 NOTE — ED Notes (Signed)
 Pt to leave AMA. AMA paperwork signed by patient. Instructions given in paperwork for follow up care. Dr. Viviann signed AMA paperwork. Pt informed of the risks and benefits of leaving and not staying here for further cardiology care.

## 2023-10-03 NOTE — Telephone Encounter (Signed)
Patient stated he was returning RN's call.

## 2023-10-03 NOTE — ED Triage Notes (Signed)
 Patient states he was recently admitted for A-fib with RVR and Pneumonia; today he woke up with bilateral ankle swelling; also still complaining of shortness of breath.

## 2023-10-03 NOTE — ED Provider Notes (Signed)
 [facility] Provider Note    Event Date/Time   First MD Initiated Contact with Patient 10/03/23 1858     (approximate)   History   Chief Complaint: Leg Swelling   HPI  Kenneth Ferguson is a 58 y.o. male with a history of atrial fibrillation, heart failure who comes ED complaining of bilateral lower leg swelling along with some shortness of breath.  Also endorses PND.  No fever, no chest pain.  Reviewed hospital records noting he was recently in the hospital, left AMA after being advised that he would need to be hospitalized for many days for treatment of pneumonia prior to performing TEE and cardioversion.  He was discharged with bisoprolol  twice daily and Eliquis .  He notes that he was not discharged with a diuretic, and in the past torsemide  worked well for him where Lasix  did not.  Patient reports that he believes his A-fib and heart failure conditions are situational and related to Takotsubo cardiomyopathy from his wife's death 5 years ago but he believes that since then have been resolved.  He has been noncompliant with his medications.       Past Medical History:  Diagnosis Date   HFrEF (heart failure with reduced ejection fraction) (HCC)    a. 08/2018 Echo: EF 40-45% in setting of Afib; b. 11/2018 Echo: EF 25-30% w/ sev ant, antsept, and apical HK in setting of Afib w/ RVR; b. 08/2019 Echo: EF 60-65%.   HOCM (hypertrophic obstructive cardiomyopathy) (HCC)    a. Prev dx in early 2000s?; b. 08/2019 Echo: EF 60-65%, sev asym septal hypertrophy w/ septal wall of 20mm. SAM. LVOT grad of @ rest, w/ valsalva. Gr2 DD. Nl RV fxn. Mod dil LA. Mild MR.   Morbid obesity (HCC)    Persistent atrial fibrillation (HCC)    a. 11/2018 s/p TEE/DCCV. Maintained on Amio/Eliquis  (CHA2DS2VASc =1).   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 08/2018 TEE: LAA thrombus.    Current Outpatient Rx   Order #: [id number] Class:  Normal   Order #: [id number] Class: Normal   Order #: [id number] Class: Normal   Order #: [id number] Class: Normal   Order #: [id number] Class: Historical Med   Order #: [id number] Class: Normal   Order #: [id number] Class: Historical Med   Order #: [id number] Class: Historical Med    Past Surgical History:  Procedure Laterality Date   CARDIOVERSION N/A 08/08/2018   Procedure: CARDIOVERSION;  Surgeon: Perla Evalene PARAS, MD;  Location: [facility] ORS;  Service: Cardiovascular;  Laterality: N/A;   CARDIOVERSION N/A 11/20/2018   Procedure: CARDIOVERSION;  Surgeon: Mady Bruckner, MD;  Location: [facility] OR[facility];  Service: Cardiovascular;  Laterality: N/A;   CARDIOVERSION N/A 11/20/2018   Procedure: CARDIOVERSION;  Surgeon: Mady Bruckner, MD;  Location: [facility] OR[facility];  Service: Cardiovascular;  Laterality: N/A;   CARDIOVERSION N/A 12/07/2018   Procedure: CARDIOVERSION;  Surgeon: Perla Evalene PARAS, MD;  Location: [facility] OR[facility];  Service: Cardiovascular;  Laterality: N/A;   TEE WITHOUT CARDIOVERSION N/A 08/08/2018   Procedure: TRANSESOPHAGEAL ECHOCARDIOGRAM (TEE);  Surgeon: Perla Evalene PARAS, MD;  Location: [facility] OR[facility];  Service: Cardiovascular;  Laterality: N/A;   TEE WITHOUT CARDIOVERSION N/A 11/20/2018   Procedure: TRANSESOPHAGEAL ECHOCARDIOGRAM (TEE);  Surgeon: Mady Bruckner, MD;  Location: [facility] OR[facility];  Service: Cardiovascular;  Laterality: N/A;    Physical Exam   Triage Vital Signs: ED Triage Vitals [10/03/23 1837]  Encounter Vitals Group     BP (!) 139/53     Girls Systolic BP Percentile  Girls Diastolic BP Percentile      Boys Systolic BP Percentile      Boys Diastolic BP Percentile      Pulse Rate (!) 150     Resp 20     Temp 97.7 F (36.5 C)     Temp Source Oral     SpO2 95 %     Weight      Height      Head Circumference      Peak Flow      Pain Score      Pain Loc      Pain Education      Exclude from Growth Chart     Most recent vital signs: Vitals:   10/03/23 2125 10/03/23 2244  BP: (!)  139/92 110/62  Pulse: (!) 164 (!) 104  Resp: 16 16  Temp:  98.2 F (36.8 C)  SpO2:  99%    General: Awake, no distress.  CV:  Good peripheral perfusion.  Irregular rhythm, tachycardia heart rate 130-160. Resp:  Normal effort.  Clear to auscultation bilaterally Abd:  No distention.  Soft nontender Other:  2+ pitting edema bilateral lower extremities   ED Results / Procedures / Treatments   Labs (all labs ordered are listed, but only abnormal results are displayed) Labs Reviewed  CBC - Abnormal; Notable for the following components:      Result Value   RDW 15.7 (*)    All other components within normal limits  COMPREHENSIVE METABOLIC PANEL WITH GFR - Abnormal; Notable for the following components:   Glucose, Bld 128 (*)    Albumin 3.4 (*)    AST 54 (*)    ALT 136 (*)    All other components within normal limits     EKG Interpreted by me Atrial fibrillation, heart rate 150.  Right axis, poor R wave progression, no acute ischemic changes.   RADIOLOGY Chest x-ray interpreted by me, shows hazy infiltrate of the right lung.  Radiology report reviewed   PROCEDURES:  .Critical Care  Performed by: Viviann Pastor, MD Authorized by: Viviann Pastor, MD   Critical care provider statement:    Critical care time (minutes):  35   Critical care time was exclusive of:  Separately billable procedures and treating other patients   Critical care was necessary to treat or prevent imminent or life-threatening deterioration of the following conditions:  Cardiac failure and circulatory failure   Critical care was time spent personally by me on the following activities:  Development of treatment plan with patient or surrogate, discussions with consultants, evaluation of patient's response to treatment, examination of patient, obtaining history from patient or surrogate, ordering and performing treatments and interventions, ordering and review of laboratory studies, ordering and review  of radiographic studies, pulse oximetry, re-evaluation of patient's condition and review of old charts    MEDICATIONS ORDERED IN ED: Medications  diltiazem  (CARDIZEM ) injection 20 mg (20 mg Intravenous Given 10/03/23 1938)  furosemide  (LASIX ) injection 40 mg (40 mg Intravenous Given 10/03/23 1940)  diltiazem  (CARDIZEM ) injection 10 mg (10 mg Intravenous Given 10/03/23 2125)     IMPRESSION / MDM / ASSESSMENT AND PLAN / ED COURSE  I reviewed the triage vital signs and the nursing notes.  IIk:izrnfezwdjuzi heart failure, electrolyte derangement, idiopathic A-fib with RVR, AKI  Patient's presentation is most consistent with acute presentation with potential threat to life or bodily function.  Patient comes ED complaining of lower extremity edema, requesting diuresis.  He gives a long  detailed history which overall minimizes the severity, and makes clear that the patient is not willing to be admitted for management of his readily apparent decompensated heart failure and A-fib with RVR.  Will give IV diltiazem  bolus for rate control, Lasix  bolus for diuresis, discussed with cardiology.   Clinical Course as of 10/03/23 2355  Tue Oct 03, 2023  2111 D/w cardiology Dr. Nancey who notes high risk of further decompensation and death and advises there is no safe outpatient regimen for the patient, that he requires admission to ICU tonight for EEG and ECV tomorrow. I discussed this advice with the pt who continues to refuse hospitalization. He notes that he has bisoprolol  at home that he took first dose of this morning. He states he feels 100% better and back to normal after having diuresis of in the ED. Denies cp, sob, fatigue, dizziness. Only willing to entertain adding torsemide  to his current regimen and continuing to follow up with cardiology in office. Also requests his Eliquis  rx be changed to [facility] pharmacy. Unfortunately the pt seems to be in denial about the seriousness of his cardiac dz and  will be discharged against medical mind. [PS]    Clinical Course User Index [PS] Viviann Pastor, MD     FINAL CLINICAL IMPRESSION(S) / ED DIAGNOSES   Final diagnoses:  Acute systolic congestive heart failure (HCC)  Atrial fibrillation with RVR [facility])  Left AGAINST MEDICAL ADVICE   Rx / DC Orders   ED Discharge Orders          Ordered    apixaban  (ELIQUIS ) 5 MG TABS tablet  2 times daily        10/03/23 2121    torsemide  (DEMADEX ) 20 MG tablet  Daily        10/03/23 2121    Ambulatory referral to Cardiology       Comments: If you have not heard from the Cardiology office within the next 72 hours please call [phone].   10/03/23 2122             Note:  This document was prepared using Dragon voice recognition software and may include unintentional dictation errors.   Viviann Pastor, MD 10/03/23 [phone]

## 2023-10-03 NOTE — Telephone Encounter (Signed)
 Called and notified patient that his letter from Dr. Mady is ready for pick up at his convenience.

## 2023-10-03 NOTE — ED Notes (Signed)
 Pt had 6 runs of vtach and then 4 more runs of vtach. Dr. Viviann aware

## 2023-10-03 NOTE — Telephone Encounter (Signed)
 Patient called in reporting that he has some swelling to his lower legs. He wanted to be seen today but advised we do not have anything open. Advised that he is scheduled for next Wednesday but he states this is an emergency and needs something before then. Offered appointment for tomorrow but he is not able to come because he has to go to work. Advised that if he can't come tomorrow then next available is on that Wednesday. He really felt this was very important and that he should come in today. Again reviewed we do not have any appointments for today. He requested that I please send message to Dr. Mady for possible fluid pill to help with the swelling. Advised that I will be happy to send to provider but that he may want to see you first. Again offered appointment for tomorrow but he declined due to working. He verbalized understanding but reports that he is not able to wait until next week. Sending to provider for his review and recommendations.

## 2023-10-04 ENCOUNTER — Other Ambulatory Visit: Payer: Self-pay

## 2023-10-11 ENCOUNTER — Ambulatory Visit: Admitting: Internal Medicine

## 2023-11-06 ENCOUNTER — Other Ambulatory Visit: Payer: Self-pay | Admitting: Internal Medicine

## 2023-11-06 MED ORDER — TORSEMIDE 20 MG PO TABS: 20.0000 mg | ORAL_TABLET | Freq: Every day | ORAL | 0 refills | Status: AC

## 2023-11-06 MED ORDER — BISOPROLOL FUMARATE 5 MG PO TABS: 5.0000 mg | ORAL_TABLET | Freq: Two times a day (BID) | ORAL | 0 refills | Status: AC

## 2023-11-06 NOTE — Telephone Encounter (Signed)
*  STAT* If patient is at the pharmacy, call can be transferred to refill team.   1. Which medications need to be refilled? (please list name of each medication and dose if known)   torsemide  (DEMADEX ) 20 MG tablet  bisoprolol  (ZEBETA ) 5 MG tablet   2. Would you like to learn more about the convenience, safety, & potential cost savings by using the [facility]?   3. Are you open to using the [facility] (Type [facility]. ).  4. Which pharmacy/location (including street and city if local pharmacy) is medication to be sent to?  [id number] - [place], [place] - [address] AT [address]   5. Do they need a 30 day or 90 day supply?   90 day  Patient stated he is almost out of these medications.

## 2023-11-06 NOTE — Telephone Encounter (Signed)
 Pt is requesting a refill on medications torsemide  and bisoprolol  that were prescribed in the hospital. Would Dr. Mady like to refill these medications? Please address

## 2023-11-07 NOTE — Telephone Encounter (Signed)
 I will refill these prescriptions once.  However, Kenneth Ferguson has been noncompliant with therapies and follow-up multiple times.  Unless he follows up routinely in our clinic (he has also asked to transition his care to Dr. Gollan going forward), no further prescriptions will be provided.  Lonni Hanson, MD [facility]

## 2023-11-08 NOTE — Telephone Encounter (Signed)
 Per chart review, refills sent on 11/06/23.
